# Patient Record
Sex: Female | Born: 1945
Health system: Southern US, Community
[De-identification: ages and names within clinical notes are randomized; demographics above are authoritative.]

## PROBLEM LIST (undated history)

## (undated) DIAGNOSIS — I10 Essential (primary) hypertension: Secondary | ICD-10-CM

## (undated) DIAGNOSIS — G2581 Restless legs syndrome: Secondary | ICD-10-CM

## (undated) DIAGNOSIS — M1990 Unspecified osteoarthritis, unspecified site: Secondary | ICD-10-CM

## (undated) DIAGNOSIS — R32 Unspecified urinary incontinence: Secondary | ICD-10-CM

## (undated) DIAGNOSIS — N939 Abnormal uterine and vaginal bleeding, unspecified: Secondary | ICD-10-CM

## (undated) DIAGNOSIS — E7800 Pure hypercholesterolemia, unspecified: Secondary | ICD-10-CM

## (undated) HISTORY — PX: ROTATOR CUFF REPAIR: SHX139

## (undated) HISTORY — DX: Unspecified osteoarthritis, unspecified site: M19.90

## (undated) HISTORY — DX: Abnormal uterine and vaginal bleeding, unspecified: N93.9

## (undated) HISTORY — DX: Restless legs syndrome: G25.81

## (undated) HISTORY — PX: CARDIOVERSION: SHX1299

## (undated) HISTORY — PX: RADIOFREQUENCY ABLATION: SHX2290

## (undated) HISTORY — PX: OTHER SURGICAL HISTORY: SHX169

## (undated) HISTORY — PX: TUBAL LIGATION: SHX77

---

## 2013-06-03 DIAGNOSIS — I10 Essential (primary) hypertension: Secondary | ICD-10-CM | POA: Insufficient documentation

## 2013-06-03 DIAGNOSIS — I4891 Unspecified atrial fibrillation: Secondary | ICD-10-CM

## 2013-06-03 HISTORY — DX: Essential (primary) hypertension: I10

## 2013-06-03 HISTORY — DX: Unspecified atrial fibrillation: I48.91

## 2016-07-02 ENCOUNTER — Encounter (HOSPITAL_COMMUNITY): Payer: Self-pay | Admitting: Emergency Medicine

## 2016-07-02 ENCOUNTER — Ambulatory Visit (HOSPITAL_COMMUNITY)
Admission: EM | Admit: 2016-07-02 | Discharge: 2016-07-02 | Disposition: A | Discharge status: Home / Self Care | Payer: Medicare Other | Attending: Family Medicine | Admitting: Family Medicine

## 2016-07-02 DIAGNOSIS — N390 Urinary tract infection, site not specified: Secondary | ICD-10-CM

## 2016-07-02 DIAGNOSIS — R319 Hematuria, unspecified: Secondary | ICD-10-CM | POA: Diagnosis not present

## 2016-07-02 HISTORY — DX: Pure hypercholesterolemia, unspecified: E78.00

## 2016-07-02 HISTORY — DX: Unspecified urinary incontinence: R32

## 2016-07-02 HISTORY — DX: Essential (primary) hypertension: I10

## 2016-07-02 LAB — POCT URINALYSIS DIP (DEVICE)
BILIRUBIN URINE: NEGATIVE
Glucose, UA: NEGATIVE mg/dL
Ketones, ur: NEGATIVE mg/dL
NITRITE: POSITIVE — AB
PH: 5.5 (ref 5.0–8.0)
PROTEIN: 30 mg/dL — AB
Specific Gravity, Urine: 1.015 (ref 1.005–1.030)
Urobilinogen, UA: 0.2 mg/dL (ref 0.0–1.0)

## 2016-07-02 MED ORDER — LIDOCAINE HCL (PF) 1 % IJ SOLN
INTRAMUSCULAR | Status: AC
  Filled 2016-07-02: qty 2

## 2016-07-02 MED ORDER — CEFTRIAXONE SODIUM 1 G IJ SOLR
INTRAMUSCULAR | Status: AC
  Filled 2016-07-02: qty 10

## 2016-07-02 MED ORDER — CEFTRIAXONE SODIUM 1 G IJ SOLR
1.0000 g | Freq: Once | INTRAMUSCULAR | Status: AC
  Administered 2016-07-02: 1 g via INTRAMUSCULAR

## 2016-07-02 MED ORDER — CEPHALEXIN 500 MG PO CAPS: 500.0000 mg | ORAL_CAPSULE | Freq: Three times a day (TID) | ORAL | 0 refills | Status: DC

## 2016-07-02 NOTE — ED Triage Notes (Signed)
[facility] with a complaint of abdominal pain with dysuria, urinary frequency/ urgency and some nausea that all started 5 days ago.

## 2016-07-02 NOTE — ED Provider Notes (Signed)
[facility]  CSN: [id number] Arrival date & time: 07/02/16  1430  History   Chief Complaint Chief Complaint  Patient presents with  . Urinary Frequency  . Abdominal Pain    HPI Tara Collier is a 70 y.o. female with a history of UTI's and AFib on eliquis presenting for urinary frequency, urgency and lower abdominal pain.   She reports a week of worsening urinary incontinence related to urgency consistent with past UTI's. Her urine color is no darker than usual, but malodorous. No medications tried, no fevers. Has bilateral lower back pain. She has poor appetite, so eating less but drinking normally without nausea or emesis.   HPI  Past Medical History:  Diagnosis Date  . Hypercholesterolemia   . Hypertension   . Loss of bladder control     There are no active problems to display for this patient.   Past Surgical History:  Procedure Laterality Date  . atrial fib    . ROTATOR CUFF REPAIR    . TUBAL LIGATION      OB History    No data available       Home Medications    Prior to Admission medications   Medication Sig Start Date End Date Taking? Authorizing Provider  Apixaban (ELIQUIS PO) Take by mouth.   Yes Historical Provider, MD  carvedilol (COREG) 12.5 MG tablet Take 12.5 mg by mouth 2 (two) times daily with a meal.   Yes Historical Provider, MD  losartan (COZAAR) 25 MG tablet Take 25 mg by mouth daily.   Yes Historical Provider, MD  meclizine (ANTIVERT) 25 MG tablet Take 25 mg by mouth 3 (three) times daily as needed for dizziness.   Yes Historical Provider, MD  oxybutynin (DITROPAN-XL) 5 MG 24 hr tablet Take 5 mg by mouth at bedtime.   Yes Historical Provider, MD  pravastatin (PRAVACHOL) 10 MG tablet Take 10 mg by mouth daily.   Yes Historical Provider, MD  traMADol (ULTRAM) 50 MG tablet Take by mouth every 6 (six) hours as needed.   Yes Historical Provider, MD  cephALEXin (KEFLEX) 500 MG capsule Take 1 capsule (500 mg total) by mouth 3 (three) times  daily. 07/02/16   Tyrone Nineyan B Deyonte Cadden, MD    Family History History reviewed. No pertinent family history.  Social History Social History  Substance Use Topics  . Smoking status: Never Smoker  . Smokeless tobacco: Never Used  . Alcohol use No     Allergies   Patient has no known allergies.   Review of Systems Review of Systems As above  Physical Exam Triage Vital Signs ED Triage Vitals  Enc Vitals Group     BP 07/02/16 1455 93/62     Pulse Rate 07/02/16 1455 87     Resp 07/02/16 1455 20     Temp 07/02/16 1455 98.2 F (36.8 C)     Temp Source 07/02/16 1455 Oral     SpO2 07/02/16 1455 96 %     Weight 07/02/16 1455 225 lb (102.1 kg)     Height 07/02/16 1455 5' 4.5" (1.638 m)     Head Circumference --      Peak Flow --      Pain Score 07/02/16 1503 9     Pain Loc --      Pain Edu? --      Excl. in GC? --    No data found.   Updated Vital Signs BP 93/62 (BP Location: Left Arm)   Pulse 87  Temp 98.2 F (36.8 C) (Oral)   Resp 20   Ht 5' 4.5" (1.638 m)   Wt 225 lb (102.1 kg)   LMP  (Exact Date)   SpO2 96%   BMI 38.02 kg/m   Visual Acuity Right Eye Distance:   Left Eye Distance:   Bilateral Distance:    Right Eye Near:   Left Eye Near:    Bilateral Near:     Physical Exam  Constitutional: She is oriented to person, place, and time. She appears well-developed and well-nourished. No distress.  Eyes: EOM are normal. Pupils are equal, round, and reactive to light. No scleral icterus.  Neck: Neck supple. No JVD present.  Cardiovascular: Normal rate, regular rhythm, normal heart sounds and intact distal pulses.   No murmur heard. Rhythm is regular  Pulmonary/Chest: Effort normal and breath sounds normal. No respiratory distress.  Abdominal: Soft. Bowel sounds are normal. She exhibits no distension. There is tenderness (suprapubic).  Some tenderness to palpation on bilateral lower back but no true CVA tenderness.   Musculoskeletal: Normal range of motion. She  exhibits no edema or tenderness.  Lymphadenopathy:    She has no cervical adenopathy.  Neurological: She is alert and oriented to person, place, and time. She exhibits normal muscle tone.  Skin: Skin is warm and dry.  Vitals reviewed.  UC Treatments / Results  Labs (all labs ordered are listed, but only abnormal results are displayed) Labs Reviewed  POCT URINALYSIS DIP (DEVICE) - Abnormal; Notable for the following:       Result Value   Hgb urine dipstick MODERATE (*)    Protein, ur 30 (*)    Nitrite POSITIVE (*)    Leukocytes, UA LARGE (*)    All other components within normal limits  URINE CULTURE    EKG  EKG Interpretation None      Radiology No results found.  Procedures Procedures (including critical care time)  Medications Ordered in UC Medications  cefTRIAXone (ROCEPHIN) injection 1 g (not administered)     Initial Impression / Assessment and Plan / UC Course  I have reviewed the triage vital signs and the nursing notes.  Pertinent labs & imaging results that were available during my care of the patient were reviewed by me and considered in my medical decision making (see chart for details).  Final Clinical Impressions(s) / UC Diagnoses   Final diagnoses:  Urinary tract infection with hematuria, site unspecified   70 y.o. non-toxic-appearing female with grossly infected urine and UTI symptoms. She's in no distress, hydrated on exam. BP is soft, but no evidence of AFib or sepsis (afebrile, not tachycardic). Will Tx ceftriaxone IM x1 and keflex (no culture data from prior UTI's available, but no h/o instrumentation or diabetes or other complicating factors) pending urine culture results. Strict return precautions were reviewed, including inability to take po for hydration or tolerance of antibiotic or fever/worsening of symptoms.   New Prescriptions New Prescriptions   CEPHALEXIN (KEFLEX) 500 MG CAPSULE    Take 1 capsule (500 mg total) by mouth 3 (three)  times daily.     Tyrone Nineyan B Normal Recinos, MD 07/02/16 [phone]

## 2018-08-13 LAB — LIPID PANEL
Cholesterol: 170 (ref 0–200)
HDL: 50 (ref 35–70)
LDL Cholesterol: 105
Triglycerides: 77 (ref 40–160)

## 2018-08-13 LAB — VITAMIN D 25 HYDROXY (VIT D DEFICIENCY, FRACTURES): Vit D, 25-Hydroxy: 45

## 2020-08-08 ENCOUNTER — Ambulatory Visit (INDEPENDENT_AMBULATORY_CARE_PROVIDER_SITE_OTHER): Payer: Self-pay | Admitting: Physician Assistant

## 2020-08-08 ENCOUNTER — Encounter: Payer: Self-pay | Admitting: Physician Assistant

## 2020-08-08 ENCOUNTER — Other Ambulatory Visit: Payer: Self-pay

## 2020-08-08 VITALS — BP 140/80 | HR 70 | Temp 97.9°F | Ht 63.0 in | Wt 244.2 lb

## 2020-08-08 DIAGNOSIS — I4891 Unspecified atrial fibrillation: Secondary | ICD-10-CM

## 2020-08-08 DIAGNOSIS — M1990 Unspecified osteoarthritis, unspecified site: Secondary | ICD-10-CM | POA: Insufficient documentation

## 2020-08-08 DIAGNOSIS — I10 Essential (primary) hypertension: Secondary | ICD-10-CM

## 2020-08-08 DIAGNOSIS — R32 Unspecified urinary incontinence: Secondary | ICD-10-CM

## 2020-08-08 DIAGNOSIS — G2581 Restless legs syndrome: Secondary | ICD-10-CM | POA: Insufficient documentation

## 2020-08-08 DIAGNOSIS — E7800 Pure hypercholesterolemia, unspecified: Secondary | ICD-10-CM | POA: Insufficient documentation

## 2020-08-08 DIAGNOSIS — E785 Hyperlipidemia, unspecified: Secondary | ICD-10-CM

## 2020-08-08 HISTORY — DX: Unspecified urinary incontinence: R32

## 2020-08-08 MED ORDER — LOSARTAN POTASSIUM 100 MG PO TABS: 100.0000 mg | ORAL_TABLET | Freq: Every day | ORAL | 1 refills | Status: DC

## 2020-08-08 MED ORDER — MIRABEGRON ER 50 MG PO TB24: 50.0000 mg | ORAL_TABLET | Freq: Every day | ORAL | 1 refills | Status: DC

## 2020-08-08 MED ORDER — ATORVASTATIN CALCIUM 40 MG PO TABS: 40.0000 mg | ORAL_TABLET | Freq: Every day | ORAL | 1 refills | Status: DC

## 2020-08-08 MED ORDER — GABAPENTIN 100 MG PO CAPS: 100.0000 mg | ORAL_CAPSULE | Freq: Every day | ORAL | 1 refills | Status: DC

## 2020-08-08 NOTE — Progress Notes (Signed)
Tara Collier is a 74 y.o. female is here to establish care.  I acted as a Neurosurgeon for [employer], PA-C Corky Mull, LPN   History of Present Illness:   Chief Complaint  Patient presents with  . Establish Care  . Leg Swelling  . Urinary Incontinence    HPI   Pt is here to establish care today. She is present with her daughter, Tara Collier. Patient is new to the area to be close to her daughter.   HTN Currently taking Carvedilol 12.5 mg BID, Amlodopine 5 mg (takes approx twice a week - cannot tell me why she does this). She is also prescribed losartan 100 mg but has been out of this for quite awhile. At home blood pressure readings are: not checked. Patient denies chest pain, blurred vision, dizziness, unusual headaches. Patient is non compliant with medication. Denies excessive caffeine intake, stimulant usage, excessive alcohol intake, or increase in salt consumption.  BP Readings from Last 3 Encounters:  08/08/20 140/80  07/02/16 93/62   Hyperlipidemia Current prescribed lipitor 40 mg daily and not taking due to running out of medication. She reports that she was tolerating this well.  Atrial fibrillation S/p cather ablation in 2013. Currently on eliquis 5 mg BID. She takes this regularly. She states that she has not seen a cardiologist since 2014. At that time (chart is in Epic -- [facility]) she was seeing Dr. Jane Canary and was well maintained on propafenone. She cannot tell me why she is no longer on this medication. She endorses DOE and generalized swelling in her lower legs. Last stress test in 2013. Echo in 2013.  Neuropathy/RLS Currently taking 300 mg gabapentin tablet a few days a week. She states that this medication is sedating and does not like to take it however it is effective for her neuropathy and RLS.  Urinary incontinence Pt is c/o increase in incontinence, wears pull ups. Pt would like a referral to a urologist. She has taken oxybutynin in the  past but no success. She had better results with myrbetriq but she ran out of this medication. She would like better control of her incontinence because it is quite out of control and dictates her quality of life. She is tearful talking about this.    Health Maintenance Due  Topic Date Due  . Hepatitis C Screening  Never done  . TETANUS/TDAP  Never done  . MAMMOGRAM  Never done  . COLONOSCOPY  Never done  . DEXA SCAN  Never done  . PNA vac Low Risk Adult (1 of 2 - PCV13) Never done    Past Medical History:  Diagnosis Date  . Arthritis   . Atrial fibrillation (HCC) 06/03/2013   Last Assessment & Plan:  Formatting of this note might be different from the original. Continue propafenone for rhythm control .Patient has clear symptomatic benefit with sinus rhythm maintenance. CHADS-Vasc score over 2, continue anticoagulation with eliquis.  Marland Kitchen HTN (hypertension) 06/03/2013   Last Assessment & Plan:  Formatting of this note might be different from the original. Hypertension is well controlled.  Continue current medical regimen and low sodium diet.  Reevaluate at next office visit  . Hypercholesterolemia   . Restless leg syndrome   . Urinary incontinence 08/08/2020     Social History   Tobacco Use  . Smoking status: Never Smoker  . Smokeless tobacco: Never Used  Substance Use Topics  . Alcohol use: No  . Drug use: No    Past  Surgical History:  Procedure Laterality Date  . CARDIOVERSION    . RADIOFREQUENCY ABLATION    . ROTATOR CUFF REPAIR    . TUBAL LIGATION      No family history on file.  PMHx, SurgHx, SocialHx, FamHx, Medications, and Allergies were reviewed in the Visit Navigator and updated as appropriate.   Patient Active Problem List   Diagnosis Date Noted  . Urinary incontinence 08/08/2020  . Arthritis   . Hypercholesterolemia   . Restless leg syndrome   . Atrial fibrillation (HCC) 06/03/2013  . HTN (hypertension) 06/03/2013    Social History   Tobacco Use   . Smoking status: Never Smoker  . Smokeless tobacco: Never Used  Substance Use Topics  . Alcohol use: No  . Drug use: No    Current Medications and Allergies:    Current Outpatient Medications:  .  Apixaban (ELIQUIS PO), Take 5 mg by mouth 2 (two) times daily., Disp: , Rfl:  .  carvedilol (COREG) 12.5 MG tablet, Take 12.5 mg by mouth 2 (two) times daily with a meal., Disp: , Rfl:  .  diclofenac Sodium (VOLTAREN) 1 % GEL, Apply 2 g topically 4 (four) times daily., Disp: , Rfl:  .  ergocalciferol (VITAMIN D2) 1.25 MG (50000 UT) capsule, Take by mouth., Disp: , Rfl:  .  meclizine (ANTIVERT) 25 MG tablet, Take 25 mg by mouth 3 (three) times daily as needed for dizziness., Disp: , Rfl:  .  polyethylene glycol powder (GLYCOLAX/MIRALAX) 17 GM/SCOOP powder, Take 1 g by mouth daily as needed., Disp: , Rfl:  .  atorvastatin (LIPITOR) 40 MG tablet, Take 1 tablet (40 mg total) by mouth daily., Disp: 90 tablet, Rfl: 1 .  gabapentin (NEURONTIN) 100 MG capsule, Take 1 capsule (100 mg total) by mouth at bedtime., Disp: 90 capsule, Rfl: 1 .  losartan (COZAAR) 100 MG tablet, Take 1 tablet (100 mg total) by mouth daily., Disp: 90 tablet, Rfl: 1 .  mirabegron ER (MYRBETRIQ) 50 MG TB24 tablet, Take 1 tablet (50 mg total) by mouth daily., Disp: 90 tablet, Rfl: 1   Allergies  Allergen Reactions  . Tape Rash  . Bee Venom Rash    Review of Systems   ROS  Negative unless otherwise specified per HPI.  Vitals:   Vitals:   08/08/20 0834  BP: 140/80  Pulse: 70  Temp: 97.9 F (36.6 C)  TempSrc: Temporal  SpO2: 95%  Weight: 244 lb 4 oz (110.8 kg)  Height: 5\' 3"  (1.6 m)     Body mass index is 43.27 kg/m.   Physical Exam:    Physical Exam Vitals and nursing note reviewed.  Constitutional:      General: She is not in acute distress.    Appearance: She is well-developed. She is not ill-appearing, toxic-appearing or sickly-appearing.  Cardiovascular:     Rate and Rhythm: Normal rate. Rhythm  irregularly irregular.     Pulses: Normal pulses.     Heart sounds: Normal heart sounds, S1 normal and S2 normal.  Pulmonary:     Effort: Pulmonary effort is normal.     Breath sounds: Normal breath sounds.  Musculoskeletal:     Right lower leg: 1+ Edema present.     Left lower leg: 1+ Edema present.  Skin:    General: Skin is warm, dry and intact.  Neurological:     Mental Status: She is alert.     GCS: GCS eye subscore is 4. GCS verbal subscore is 5. GCS motor subscore  is 6.  Psychiatric:        Mood and Affect: Mood and affect normal.        Speech: Speech normal.        Behavior: Behavior normal. Behavior is cooperative.      Assessment and Plan:    Tara Collier was seen today for establish care, leg swelling and urinary incontinence.  Diagnoses and all orders for this visit:  Primary hypertension Overall controlled in office but she is not taking her medication consistently. Plan: -Update blood work today -Hold norvasc 5 mg due to LE swelling -Continue carvedilol 12.5 mg BID -Re-start losartan 100 mg daily -BP log given -Reviewed need to put medications in pill box with daughter present to promote better compliance -Cardiology referral due to a fib -     CBC with Differential/Platelet; Future -     Comprehensive metabolic panel; Future -     Ambulatory referral to Cardiology -     Comprehensive metabolic panel -     CBC with Differential/Platelet  Hyperlipidemia, unspecified hyperlipidemia type Update lipid panel today. Refill/adjust lipitor 40 mg as indicated.  -     Lipid panel; Future -     Ambulatory referral to Cardiology -     Lipid panel  Atrial fibrillation, unspecified type (HCC) In atrial fibrillation today on my exam. Unclear why she is no longer on her anti-arrhythmic. Continue Eliquis 5 mg BID. Cardiology referral. -     CBC with Differential/Platelet; Future -     Comprehensive metabolic panel; Future -     Ambulatory referral to Cardiology -      Comprehensive metabolic panel -     CBC with Differential/Platelet  Urinary incontinence, unspecified type Uncontrolled. Refill myrbetriq. Referral to [facility]. -     Ambulatory referral to Urology  Restless leg syndrome Uncontrolled. Does not like sedating side effects of gabapentin 300 mg daily.  Decrease to gabapentin 100 mg daily. Follow-up in 1 month.  Due to patient complexity, recommended return office visit in 1 month to continue assess ongoing chronic medical issues.  Other orders -     atorvastatin (LIPITOR) 40 MG tablet; Take 1 tablet (40 mg total) by mouth daily. -     losartan (COZAAR) 100 MG tablet; Take 1 tablet (100 mg total) by mouth daily. -     mirabegron ER (MYRBETRIQ) 50 MG TB24 tablet; Take 1 tablet (50 mg total) by mouth daily. -     gabapentin (NEURONTIN) 100 MG capsule; Take 1 capsule (100 mg total) by mouth at bedtime.  CMA or LPN served as scribe during this visit. History, Physical, and Plan performed by medical provider. The above documentation has been reviewed and is accurate and complete.  Time spent with patient today was 50 minutes which consisted of chart review, discussing diagnosis, work up, treatment answering questions and documentation.  Jarold Motto, PA-C [place], [place] 08/08/2020  Follow-up: No follow-ups on file.

## 2020-08-08 NOTE — Patient Instructions (Addendum)
It was great to see you!  HTN --Check blood pressure 2-3 times a week for me/cardiology --Let's have you take your blood pressure medication regularly: --->Carvedilol 12.5 mg twice daily --->Losartan 100 mg daily --->We are going to HOLD your 5 mg norvasc for now as this can contribute to lower leg swelling and I think your blood pressure can handle this. Cardiology referral -- you will be contacted about a referral  Restless leg syndrome Decrease gabapentin to 100 mg daily. Take this every night. If you feel like this is making you sleepy, call me.  For urinary incontinence Restart myrbetriq 50 mg Referral to urology -- you will be contacted about a referral  Please fill your pill box weekly with your daughter's oversight.  Follow-up with me in 1 MONTH.  Take care,  Jarold Motto PA-C

## 2020-08-09 LAB — COMPREHENSIVE METABOLIC PANEL
AG Ratio: 1.9 (calc) (ref 1.0–2.5)
ALT: 16 U/L (ref 6–29)
AST: 18 U/L (ref 10–35)
Albumin: 3.9 g/dL (ref 3.6–5.1)
Alkaline phosphatase (APISO): 84 U/L (ref 37–153)
BUN: 18 mg/dL (ref 7–25)
CO2: 30 mmol/L (ref 20–32)
Calcium: 9 mg/dL (ref 8.6–10.4)
Chloride: 106 mmol/L (ref 98–110)
Creat: 0.79 mg/dL (ref 0.60–0.93)
Globulin: 2.1 g/dL (calc) (ref 1.9–3.7)
Glucose, Bld: 99 mg/dL (ref 65–99)
Potassium: 4.7 mmol/L (ref 3.5–5.3)
Sodium: 141 mmol/L (ref 135–146)
Total Bilirubin: 1 mg/dL (ref 0.2–1.2)
Total Protein: 6 g/dL — ABNORMAL LOW (ref 6.1–8.1)

## 2020-08-09 LAB — CBC WITH DIFFERENTIAL/PLATELET
Absolute Monocytes: 643 cells/uL (ref 200–950)
Basophils Absolute: 38 cells/uL (ref 0–200)
Basophils Relative: 0.7 %
Eosinophils Absolute: 232 cells/uL (ref 15–500)
Eosinophils Relative: 4.3 %
HCT: 34.3 % — ABNORMAL LOW (ref 35.0–45.0)
Hemoglobin: 11.2 g/dL — ABNORMAL LOW (ref 11.7–15.5)
Lymphs Abs: 1139 cells/uL (ref 850–3900)
MCH: 29.6 pg (ref 27.0–33.0)
MCHC: 32.7 g/dL (ref 32.0–36.0)
MCV: 90.7 fL (ref 80.0–100.0)
MPV: 11.9 fL (ref 7.5–12.5)
Monocytes Relative: 11.9 %
Neutro Abs: 3348 cells/uL (ref 1500–7800)
Neutrophils Relative %: 62 %
Platelets: 202 10*3/uL (ref 140–400)
RBC: 3.78 10*6/uL — ABNORMAL LOW (ref 3.80–5.10)
RDW: 12.9 % (ref 11.0–15.0)
Total Lymphocyte: 21.1 %
WBC: 5.4 10*3/uL (ref 3.8–10.8)

## 2020-08-09 LAB — LIPID PANEL
Cholesterol: 236 mg/dL — ABNORMAL HIGH (ref <200)
HDL: 45 mg/dL — ABNORMAL LOW (ref >=50)
LDL Cholesterol (Calc): 166 mg/dL (calc) — ABNORMAL HIGH
Non-HDL Cholesterol (Calc): 191 mg/dL (calc) — ABNORMAL HIGH (ref <130)
Total CHOL/HDL Ratio: 5.2 (calc) — ABNORMAL HIGH (ref <5.0)
Triglycerides: 120 mg/dL (ref <150)

## 2020-08-10 ENCOUNTER — Other Ambulatory Visit: Payer: Self-pay

## 2020-08-10 DIAGNOSIS — Z1211 Encounter for screening for malignant neoplasm of colon: Secondary | ICD-10-CM

## 2020-09-04 ENCOUNTER — Ambulatory Visit: Payer: Self-pay | Admitting: Internal Medicine

## 2020-09-05 ENCOUNTER — Encounter: Payer: Self-pay | Admitting: Physician Assistant

## 2020-09-10 ENCOUNTER — Ambulatory Visit: Payer: Self-pay | Admitting: Physician Assistant

## 2020-09-12 DIAGNOSIS — H524 Presbyopia: Secondary | ICD-10-CM | POA: Diagnosis not present

## 2020-09-12 NOTE — Progress Notes (Signed)
Cardiology Office Note:    Date:  09/12/2020   ID:  [name], DOB 08/09/1946, MRN [id number]  PCP:  Jarold Motto, PA  Cardiologist:  No primary care provider on file.  Electrophysiologist:  None   Referring MD: Jarold Motto, PA   Chief Complaint/Reason for Referral: HTN, HLD, afib  History of Present Illness:    Tara Collier is a 75 y.o. female with a history of HTN, HLD, afib. Presents for further evaluation of afib. Presents with daughter who provides some of the history.   Main concern is DOE. Can't walk long distance. Short of breath and wheezing with exertion.   Afib history includes ablation in 2013 - has taken propafenone- unclear when stopped. Apixban 5 mg BID has been continued. 2014 last cardiology visit. Last taken propafenone in 2014. Eliquis 5 mg BID now. Some nose bleeds but no major bleeding events.   Has a history of HTN. Had some LE swelling, this improved after stopping amlodipine.   "Larey Seat out" from dehydration before and UTIs.   Difficult to obtain history due to wandering thoughts. The patient denies chest pain, chest pressure, PND, orthopnea. Has some mild leg swelling. Denies cough, fever, chills. Denies nausea, vomiting. Denies syncope or presyncope. Denies dizziness or lightheadedness.    Past Medical History:  Diagnosis Date  . Arthritis   . Atrial fibrillation (HCC) 06/03/2013   Last Assessment & Plan:  Formatting of this note might be different from the original. Continue propafenone for rhythm control .Patient has clear symptomatic benefit with sinus rhythm maintenance. CHADS-Vasc score over 2, continue anticoagulation with eliquis.  Marland Kitchen HTN (hypertension) 06/03/2013   Last Assessment & Plan:  Formatting of this note might be different from the original. Hypertension is well controlled.  Continue current medical regimen and low sodium diet.  Reevaluate at next office visit  . Hypercholesterolemia   . Restless leg syndrome   . Urinary  incontinence 08/08/2020    Past Surgical History:  Procedure Laterality Date  . CARDIOVERSION    . RADIOFREQUENCY ABLATION    . ROTATOR CUFF REPAIR    . TUBAL LIGATION      Current Medications: Current Meds  Medication Sig  . atorvastatin (LIPITOR) 40 MG tablet Take 1 tablet (40 mg total) by mouth daily.  . diclofenac Sodium (VOLTAREN) 1 % GEL Apply 2 g topically 4 (four) times daily.  Marland Kitchen gabapentin (NEURONTIN) 100 MG capsule Take 1 capsule (100 mg total) by mouth at bedtime.  . mirabegron ER (MYRBETRIQ) 50 MG TB24 tablet Take 1 tablet (50 mg total) by mouth daily.  . polyethylene glycol powder (GLYCOLAX/MIRALAX) 17 GM/SCOOP powder Take 1 g by mouth daily as needed.  . [DISCONTINUED] Apixaban (ELIQUIS PO) Take 5 mg by mouth 2 (two) times daily.  . [DISCONTINUED] carvedilol (COREG) 12.5 MG tablet Take 12.5 mg by mouth 2 (two) times daily with a meal.  . [DISCONTINUED] ergocalciferol (VITAMIN D2) 1.25 MG (50000 UT) capsule Take by mouth.  . [DISCONTINUED] losartan (COZAAR) 100 MG tablet Take 1 tablet (100 mg total) by mouth daily.  . [DISCONTINUED] meclizine (ANTIVERT) 25 MG tablet Take 25 mg by mouth 3 (three) times daily as needed for dizziness.     Allergies:   Tape and Bee venom   Social History   Tobacco Use  . Smoking status: Never Smoker  . Smokeless tobacco: Never Used  Substance Use Topics  . Alcohol use: No  . Drug use: No     Family History: The patient's family  history is not on file.  ROS:   Please see the history of present illness.    All other systems reviewed and are negative.  EKGs/Labs/Other Studies Reviewed:    The following studies were reviewed today:  EKG:  Afib, septal infarct pattern.   Recent Labs: 08/08/2020: ALT 16; BUN 18; Creat 0.79; Hemoglobin 11.2; Platelets 202; Potassium 4.7; Sodium 141  Recent Lipid Panel    Component Value Date/Time   CHOL 236 (H) 08/08/2020 0935   TRIG 120 08/08/2020 0935   HDL 45 (L) 08/08/2020 0935    CHOLHDL 5.2 (H) 08/08/2020 0935   LDLCALC 166 (H) 08/08/2020 0935    Physical Exam:    VS:  BP 136/82   Pulse 71   Ht 5\' 4"  (1.626 m)   Wt 248 lb (112.5 kg)   SpO2 95%   BMI 42.57 kg/m     Wt Readings from Last 5 Encounters:  08/08/20 244 lb 4 oz (110.8 kg)  07/02/16 225 lb (102.1 kg)    Constitutional: No acute distress Eyes: sclera non-icteric, normal conjunctiva and lids ENMT: normal dentition, moist mucous membranes Cardiovascular: irregular rhythm, normal rate, no murmurs. S1 and S2 normal. Radial pulses normal bilaterally. No jugular venous distention.  Respiratory: clear to auscultation bilaterally GI : normal bowel sounds, soft and nontender. No distention.   MSK: extremities warm, well perfused. Mild bilateral edema.  NEURO: grossly nonfocal exam, moves all extremities. PSYCH: alert and oriented x 3, normal mood and affect.   ASSESSMENT:    1. Atrial fibrillation, unspecified type (HCC)   2. Dyspnea on exertion   3. Lower extremity edema   4. Hypercholesterolemia   5. Primary hypertension    PLAN:    Atrial fibrillation, unspecified type (HCC)  - Plan: EKG 12-Lead, ECHOCARDIOGRAM COMPLETE, CARDIAC EVENT MONITOR - will obtain echo and monitor to risk stratify. Afib appears persistent.  - will refer to afib clinic given history of ablation and propafenone use, to see if perhaps continuing AAD may help.   Dyspnea on exertion - Plan: EKG 12-Lead, ECHOCARDIOGRAM COMPLETE, CARDIAC EVENT MONITOR - suspect this may be related to afib. If afib burden low on monitor, would consider ischemic testing. Echo and monitor to evaluate further. Challenging to obtain clear history.   Lower extremity edema - Plan: EKG 12-Lead, ECHOCARDIOGRAM COMPLETE, CARDIAC EVENT MONITOR  Hypercholesterolemia - continue atorvastatin 40 mg dialy  Primary hypertension -continue carvedilol and losartan.    Total time of encounter: 45 minutes total time of encounter, including 37 minutes  spent in face-to-face patient care on the date of this encounter. This time includes coordination of care and counseling regarding above mentioned problem list. Remainder of non-face-to-face time involved reviewing chart documents/testing relevant to the patient encounter and documentation in the medical record. I have independently reviewed documentation from referring provider.   13/08/17, MD Snyder  [facility]    Medication Adjustments/Labs and Tests Ordered: Current medicines are reviewed at length with the patient today.  Concerns regarding medicines are outlined above.   Orders Placed This Encounter  Procedures  . CARDIAC EVENT MONITOR  . EKG 12-Lead  . ECHOCARDIOGRAM COMPLETE    No orders of the defined types were placed in this encounter.   Patient Instructions  Medication Instructions:  No Changes In Medications at this time.  *If you need a refill on your cardiac medications before your next appointment, please call your pharmacy*  Testing/Procedures: Your physician has requested that you have an echocardiogram. Echocardiography  is a painless test that uses sound waves to create images of your heart. It provides your doctor with information about the size and shape of your heart and how well your heart's chambers and valves are working. You may receive an ultrasound enhancing agent through an IV if needed to better visualize your heart during the echo.This procedure takes approximately one hour. There are no restrictions for this procedure. This will take place at the [address], [address].   [employer] Cardiac Event Monitor Instructions Your physician has requested you wear your cardiac event monitor for 14 days, (1-30). [employer] may call or text to confirm a shipping address. The monitor will be sent to a land address via UPS. [employer] will not ship a monitor to a PO BOX. It typically takes 3-5 days to receive your monitor after it has been  enrolled. [employer] will assist with USPS tracking if your package is delayed. The telephone number for [employer] is [phone]. Once you have received your monitor, please review the enclosed instructions. Instruction tutorials can also be viewed under help and settings on the enclosed cell phone. Your monitor has already been registered assigning a specific monitor serial # to you.  Applying the monitor Remove cell phone from case and turn it on. The cell phone works as IT consultantyour transmitter and needs to be within [employer] feet of you at all times. The cell phone will need to be charged on a daily basis. We recommend you plug the cell phone into the enclosed charger at your bedside table every night.  Monitor batteries: You will receive two monitor batteries labelled #1 and #2. These are your recorders. Plug battery #2 onto the second connection on the enclosed charger. Keep one battery on the charger at all times. This will keep the monitor battery deactivated. It will also keep it fully charged for when you need to switch your monitor batteries. A small light will be blinking on the battery emblem when it is charging. The light on the battery emblem will remain on when the battery is fully charged.  Open package of a Monitor strip. Insert battery #1 into black hood on strip and gently squeeze monitor battery onto connection as indicated in instruction booklet. Set aside while preparing skin.  Choose location for your strip, vertical or horizontal, as indicated in the instruction booklet. Shave to remove all hair from location. There cannot be any lotions, oils, powders, or colognes on skin where monitor is to be applied. Wipe skin clean with enclosed Saline wipe. Dry skin completely.  Peel paper labeled #1 off the back of the Monitor strip exposing the adhesive. Place the monitor on the chest in the vertical or horizontal position shown in the instruction booklet. One arrow on the monitor  strip must be pointing upward. Carefully remove paper labeled #2, attaching remainder of strip to your skin. Try not to create any folds or wrinkles in the strip as you apply it.  Firmly press and release the circle in the center of the monitor battery. You will hear a small beep. This is turning the monitor battery on. The heart emblem on the monitor battery will light up every 5 seconds if the monitor battery in turned on and connected to the patient securely. Do not push and hold the circle down as this turns the monitor battery off. The cell phone will locate the monitor battery. A screen will appear on the cell phone checking the connection of your monitor strip. This  may read poor connection initially but change to good connection within the next minute. Once your monitor accepts the connection you will hear a series of 3 beeps followed by a climbing crescendo of beeps. A screen will appear on the cell phone showing the two monitor strip placement options. Touch the picture that demonstrates where you applied the monitor strip.  Your monitor strip and battery are waterproof. You are able to shower, bathe, or swim with the monitor on. They just ask you do not submerge deeper than 3 feet underwater. We recommend removing the monitor if you are swimming in a lake, river, or ocean.  Your monitor battery will need to be switched to a fully charged monitor battery approximately once a week. The cell phone will alert you of an action which needs to be made.  On the cell phone, tap for details to reveal connection status, monitor battery status, and cell phone battery status. The green dots indicates your monitor is in good status. A red dot indicates there is something that needs your attention.  To record a symptom, click the circle on the monitor battery. In 30-60 seconds a list of symptoms will appear on the cell phone. Select your symptom and tap save. Your monitor will record a sustained  or significant arrhythmia regardless of you clicking the button. Some patients do not feel the heart rhythm irregularities. [employer] will notify us of any serious or critical events.  Refer to instruction booklet for instructions on switching batteries, changing strips, the Do not disturb or Pause features, or any additional questions.  Call [employer] at [phone], to confirm your monitor is transmitting and record your baseline. They will answer any questions you may have regarding the monitor instructions at that time.  Returning the monitor to [employer] Place all equipment back into blue box. Peel off strip of paper to expose adhesive and close box securely. There is a prepaid UPS shipping label on this box. Drop in a UPS drop box, or at a UPS facility like [employer]. You may also contact [employer] to arrange UPS to pick up monitor package at your home.  Follow-Up: At [facility], you and your health needs are our priority.  As part of our continuing mission to provide you with exceptional heart care, we have created designated Provider Care Teams.  These Care Teams include your primary Cardiologist (physician) and Advanced Practice Providers (APPs -  Physician Assistants and Nurse Practitioners) who all work together to provide you with the care you need, when you need it.  We recommend signing up for the patient portal called "MyChart".  Sign up information is provided on this After Visit Summary.  MyChart is used to connect with patients for Virtual Visits (Telemedicine).  Patients are able to view lab/test results, encounter notes, upcoming appointments, etc.  Non-urgent messages can be sent to your provider as well.   To learn more about what you can do with MyChart, go to [email or web address].    Your next appointment:   AFTER A FIB CLINIC   The format for your next appointment:   In Person  Provider:   Weston Brass, MD  Other Instructions PATIENT NEEDS  APPOINTMENT AT [facility] CLINIC

## 2020-09-13 ENCOUNTER — Encounter: Payer: Self-pay | Admitting: *Deleted

## 2020-09-13 ENCOUNTER — Other Ambulatory Visit: Payer: Self-pay

## 2020-09-13 ENCOUNTER — Ambulatory Visit (INDEPENDENT_AMBULATORY_CARE_PROVIDER_SITE_OTHER): Payer: Medicare HMO | Admitting: Internal Medicine

## 2020-09-13 ENCOUNTER — Encounter: Payer: Self-pay | Admitting: Internal Medicine

## 2020-09-13 VITALS — BP 136/82 | HR 71 | Ht 64.0 in | Wt 248.0 lb

## 2020-09-13 DIAGNOSIS — R351 Nocturia: Secondary | ICD-10-CM | POA: Diagnosis not present

## 2020-09-13 DIAGNOSIS — R0600 Dyspnea, unspecified: Secondary | ICD-10-CM | POA: Diagnosis not present

## 2020-09-13 DIAGNOSIS — R350 Frequency of micturition: Secondary | ICD-10-CM | POA: Diagnosis not present

## 2020-09-13 DIAGNOSIS — D6869 Other thrombophilia: Secondary | ICD-10-CM

## 2020-09-13 DIAGNOSIS — I4891 Unspecified atrial fibrillation: Secondary | ICD-10-CM

## 2020-09-13 DIAGNOSIS — I10 Essential (primary) hypertension: Secondary | ICD-10-CM | POA: Diagnosis not present

## 2020-09-13 DIAGNOSIS — R600 Localized edema: Secondary | ICD-10-CM

## 2020-09-13 DIAGNOSIS — R0609 Other forms of dyspnea: Secondary | ICD-10-CM

## 2020-09-13 DIAGNOSIS — N3944 Nocturnal enuresis: Secondary | ICD-10-CM | POA: Diagnosis not present

## 2020-09-13 DIAGNOSIS — E7800 Pure hypercholesterolemia, unspecified: Secondary | ICD-10-CM | POA: Diagnosis not present

## 2020-09-13 DIAGNOSIS — N3946 Mixed incontinence: Secondary | ICD-10-CM | POA: Diagnosis not present

## 2020-09-13 NOTE — Progress Notes (Signed)
Patient ID: Tara Collier, female   DOB: [birth year], 75 y.o.   MRN: [id number] Patient enrolled for [employer] to ship a 14 day cardiac event monitor to her home.

## 2020-09-13 NOTE — Patient Instructions (Signed)
Medication Instructions:  No Changes In Medications at this time.  *If you need a refill on your cardiac medications before your next appointment, please call your pharmacy*  Testing/Procedures: Your physician has requested that you have an echocardiogram. Echocardiography is a painless test that uses sound waves to create images of your heart. It provides your doctor with information about the size and shape of your heart and how well your heart's chambers and valves are working. You may receive an ultrasound enhancing agent through an IV if needed to better visualize your heart during the echo.This procedure takes approximately one hour. There are no restrictions for this procedure. This will take place at the [address]   [employer] Cardiac Event Monitor Instructions Your physician has requested you wear your cardiac event monitor for 14 days, (1-30). [employer] may call or text to confirm a shipping address. The monitor will be sent to a land address via UPS. [employer] will not ship a monitor to a PO BOX. It typically takes 3-5 days to receive your monitor after it has been enrolled. [employer] will assist with USPS tracking if your package is delayed. The telephone number for [employer] is [phone]. Once you have received your monitor, please review the enclosed instructions. Instruction tutorials can also be viewed under help and settings on the enclosed cell phone. Your monitor has already been registered assigning a specific monitor serial # to you.  Applying the monitor Remove cell phone from case and turn it on. The cell phone works as IT consultant and needs to be within [employer] of you at all times. The cell phone will need to be charged on a daily basis. We recommend you plug the cell phone into the enclosed charger at your bedside table every night.  Monitor batteries: You will receive two monitor batteries labelled #1 and #2. These are your recorders.  Plug battery #2 onto the second connection on the enclosed charger. Keep one battery on the charger at all times. This will keep the monitor battery deactivated. It will also keep it fully charged for when you need to switch your monitor batteries. A small light will be blinking on the battery emblem when it is charging. The light on the battery emblem will remain on when the battery is fully charged.  Open package of a Monitor strip. Insert battery #1 into black hood on strip and gently squeeze monitor battery onto connection as indicated in instruction booklet. Set aside while preparing skin.  Choose location for your strip, vertical or horizontal, as indicated in the instruction booklet. Shave to remove all hair from location. There cannot be any lotions, oils, powders, or colognes on skin where monitor is to be applied. Wipe skin clean with enclosed Saline wipe. Dry skin completely.  Peel paper labeled #1 off the back of the Monitor strip exposing the adhesive. Place the monitor on the chest in the vertical or horizontal position shown in the instruction booklet. One arrow on the monitor strip must be pointing upward. Carefully remove paper labeled #2, attaching remainder of strip to your skin. Try not to create any folds or wrinkles in the strip as you apply it.  Firmly press and release the circle in the center of the monitor battery. You will hear a small beep. This is turning the monitor battery on. The heart emblem on the monitor battery will light up every 5 seconds if the monitor battery in turned on and connected to the patient securely.  Do not push and hold the circle down as this turns the monitor battery off. The cell phone will locate the monitor battery. A screen will appear on the cell phone checking the connection of your monitor strip. This may read poor connection initially but change to good connection within the next minute. Once your monitor accepts the connection you  will hear a series of 3 beeps followed by a climbing crescendo of beeps. A screen will appear on the cell phone showing the two monitor strip placement options. Touch the picture that demonstrates where you applied the monitor strip.  Your monitor strip and battery are waterproof. You are able to shower, bathe, or swim with the monitor on. They just ask you do not submerge deeper than 3 feet underwater. We recommend removing the monitor if you are swimming in a lake, river, or ocean.  Your monitor battery will need to be switched to a fully charged monitor battery approximately once a week. The cell phone will alert you of an action which needs to be made.  On the cell phone, tap for details to reveal connection status, monitor battery status, and cell phone battery status. The green dots indicates your monitor is in good status. A red dot indicates there is something that needs your attention.  To record a symptom, click the circle on the monitor battery. In 30-60 seconds a list of symptoms will appear on the cell phone. Select your symptom and tap save. Your monitor will record a sustained or significant arrhythmia regardless of you clicking the button. Some patients do not feel the heart rhythm irregularities. [employer] will notify us of any serious or critical events.  Refer to instruction booklet for instructions on switching batteries, changing strips, the Do not disturb or Pause features, or any additional questions.  Call [employer] at [phone], to confirm your monitor is transmitting and record your baseline. They will answer any questions you may have regarding the monitor instructions at that time.  Returning the monitor to [employer] Place all equipment back into blue box. Peel off strip of paper to expose adhesive and close box securely. There is a prepaid UPS shipping label on this box. Drop in a UPS drop box, or at a UPS facility like [employer]. You may also contact  [employer] to arrange UPS to pick up monitor package at your home.  Follow-Up: At [facility], you and your health needs are our priority.  As part of our continuing mission to provide you with exceptional heart care, we have created designated Provider Care Teams.  These Care Teams include your primary Cardiologist (physician) and Advanced Practice Providers (APPs -  Physician Assistants and Nurse Practitioners) who all work together to provide you with the care you need, when you need it.  We recommend signing up for the patient portal called "MyChart".  Sign up information is provided on this After Visit Summary.  MyChart is used to connect with patients for Virtual Visits (Telemedicine).  Patients are able to view lab/test results, encounter notes, upcoming appointments, etc.  Non-urgent messages can be sent to your provider as well.   To learn more about what you can do with MyChart, go to [email or web address].    Your next appointment:   AFTER A FIB CLINIC   The format for your next appointment:   In Person  Provider:   Weston Brass, MD  Other Instructions PATIENT NEEDS APPOINTMENT AT [facility] CLINIC

## 2020-09-14 ENCOUNTER — Ambulatory Visit: Payer: Self-pay | Admitting: Physician Assistant

## 2020-09-24 ENCOUNTER — Other Ambulatory Visit: Payer: Self-pay

## 2020-09-24 ENCOUNTER — Ambulatory Visit (HOSPITAL_COMMUNITY): Payer: Self-pay | Admitting: Physician Assistant

## 2020-09-24 ENCOUNTER — Encounter: Payer: Self-pay | Admitting: Physician Assistant

## 2020-09-24 ENCOUNTER — Ambulatory Visit (INDEPENDENT_AMBULATORY_CARE_PROVIDER_SITE_OTHER): Payer: Medicare HMO | Admitting: Physician Assistant

## 2020-09-24 VITALS — BP 128/76 | HR 86 | Temp 97.9°F | Ht 64.0 in | Wt 242.5 lb

## 2020-09-24 DIAGNOSIS — I10 Essential (primary) hypertension: Secondary | ICD-10-CM | POA: Diagnosis not present

## 2020-09-24 DIAGNOSIS — R32 Unspecified urinary incontinence: Secondary | ICD-10-CM | POA: Diagnosis not present

## 2020-09-24 DIAGNOSIS — R29818 Other symptoms and signs involving the nervous system: Secondary | ICD-10-CM

## 2020-09-24 DIAGNOSIS — R710 Precipitous drop in hematocrit: Secondary | ICD-10-CM | POA: Diagnosis not present

## 2020-09-24 DIAGNOSIS — G2581 Restless legs syndrome: Secondary | ICD-10-CM

## 2020-09-24 LAB — CBC WITH DIFFERENTIAL/PLATELET
Basophils Absolute: 0 10*3/uL (ref 0.0–0.1)
Basophils Relative: 0.8 % (ref 0.0–3.0)
Eosinophils Absolute: 0.2 10*3/uL (ref 0.0–0.7)
Eosinophils Relative: 3.3 % (ref 0.0–5.0)
HCT: 34.4 % — ABNORMAL LOW (ref 36.0–46.0)
Hemoglobin: 11.3 g/dL — ABNORMAL LOW (ref 12.0–15.0)
Lymphocytes Relative: 18.4 % (ref 12.0–46.0)
Lymphs Abs: 1 10*3/uL (ref 0.7–4.0)
MCHC: 33 g/dL (ref 30.0–36.0)
MCV: 87.6 fl (ref 78.0–100.0)
Monocytes Absolute: 0.5 10*3/uL (ref 0.1–1.0)
Monocytes Relative: 8.6 % (ref 3.0–12.0)
Neutro Abs: 3.7 10*3/uL (ref 1.4–7.7)
Neutrophils Relative %: 68.9 % (ref 43.0–77.0)
Platelets: 197 10*3/uL (ref 150.0–400.0)
RBC: 3.92 Mil/uL (ref 3.87–5.11)
RDW: 14.4 % (ref 11.5–15.5)
WBC: 5.4 10*3/uL (ref 4.0–10.5)

## 2020-09-24 LAB — FERRITIN: Ferritin: 16.1 ng/mL (ref 10.0–291.0)

## 2020-09-24 LAB — VITAMIN B12: Vitamin B-12: 368 pg/mL (ref 211–911)

## 2020-09-24 LAB — IRON: Iron: 79 ug/dL (ref 42–145)

## 2020-09-24 NOTE — Patient Instructions (Signed)
It was great to see you!  Referral placed for the sleep study provider to call you (neurology)  Update blood work today. Stool cards to send in.  Let's follow-up in 3 months, sooner if you have concerns.  If a referral was placed today, you will be contacted for an appointment. Please note that routine referrals can sometimes take up to 3-4 weeks to process. Please call our office if you haven't heard anything after this time frame.  Take care,  Jarold Motto PA-C

## 2020-09-24 NOTE — Progress Notes (Signed)
Tara Collier is a 75 y.o. female is here to discuss:  I acted as a Neurosurgeon for [employer], PA-C Corky Mull, LPN   History of Present Illness:   Chief Complaint  Patient presents with  . Hypertension    HPI   Hypertension Pt here for f/u on blood pressure, currently taking Carvedilol 12.5 mg BID, Losartan 100 mg daily. She is not checking blood pressure. Pt denies headaches, dizziness, blurred vision, chest pain, SOB or lower leg edema. Denies excessive caffeine intake, stimulant usage, excessive alcohol intake or increase in salt consumption.  Has seen cardiology since she last saw me. Planning on wearing a holter monitor and do an echo soon.   Urinary incontinence She went to [facility] since she last saw me. She is due to follow-up in March for a urodynamics and cystoscopy. Determined to have mixed incontinence and bed-wetting.   Neuropathy/RLS Has been taking gabapentin 300 mg as needed. She did not pick up the lower dose that we prescribed at the last visit because she forgot to pick this up.  She states that she is tolerating this.   Decreased hemoglobin Found on last lab check. Hgb was 11.2. I have no other prior labs to compare to. Denies: lightheadedness, dizziness, palpitations, rectal bleeding.   OSA Suspected sleep apnea. Would like a sleep study.     Health Maintenance Due  Topic Date Due  . Hepatitis C Screening  Never done  . COLONOSCOPY (Pts 45-19yrs Insurance coverage will need to be confirmed)  Never done  . MAMMOGRAM  Never done  . DEXA SCAN  Never done  . PNA vac Low Risk Adult (2 of 2 - PPSV23) 10/06/2018    Past Medical History:  Diagnosis Date  . Arthritis   . Atrial fibrillation (HCC) 06/03/2013   Last Assessment & Plan:  Formatting of this note might be different from the original. Continue propafenone for rhythm control .Patient has clear symptomatic benefit with sinus rhythm maintenance. CHADS-Vasc score over 2, continue  anticoagulation with eliquis.  Marland Kitchen HTN (hypertension) 06/03/2013   Last Assessment & Plan:  Formatting of this note might be different from the original. Hypertension is well controlled.  Continue current medical regimen and low sodium diet.  Reevaluate at next office visit  . Hypercholesterolemia   . Restless leg syndrome   . Urinary incontinence 08/08/2020     Social History   Tobacco Use  . Smoking status: Never Smoker  . Smokeless tobacco: Never Used  Substance Use Topics  . Alcohol use: No  . Drug use: No    Past Surgical History:  Procedure Laterality Date  . CARDIOVERSION    . RADIOFREQUENCY ABLATION    . ROTATOR CUFF REPAIR    . TUBAL LIGATION      History reviewed. No pertinent family history.  PMHx, SurgHx, SocialHx, FamHx, Medications, and Allergies were reviewed in the Visit Navigator and updated as appropriate.   Patient Active Problem List   Diagnosis Date Noted  . Urinary incontinence 08/08/2020  . Arthritis   . Hypercholesterolemia   . Restless leg syndrome   . Atrial fibrillation (HCC) 06/03/2013  . HTN (hypertension) 06/03/2013    Social History   Tobacco Use  . Smoking status: Never Smoker  . Smokeless tobacco: Never Used  Substance Use Topics  . Alcohol use: No  . Drug use: No    Current Medications and Allergies:    Current Outpatient Medications:  .  Apixaban (ELIQUIS PO), Take 5  mg by mouth 2 (two) times daily., Disp: , Rfl:  .  atorvastatin (LIPITOR) 40 MG tablet, Take 1 tablet (40 mg total) by mouth daily., Disp: 90 tablet, Rfl: 1 .  carvedilol (COREG) 12.5 MG tablet, Take 12.5 mg by mouth 2 (two) times daily with a meal., Disp: , Rfl:  .  diclofenac Sodium (VOLTAREN) 1 % GEL, Apply 2 g topically 4 (four) times daily., Disp: , Rfl:  .  gabapentin (NEURONTIN) 100 MG capsule, Take 1 capsule (100 mg total) by mouth at bedtime., Disp: 90 capsule, Rfl: 1 .  losartan (COZAAR) 100 MG tablet, Take 1 tablet (100 mg total) by mouth daily., Disp:  90 tablet, Rfl: 1 .  meclizine (ANTIVERT) 25 MG tablet, Take 25 mg by mouth 3 (three) times daily as needed for dizziness., Disp: , Rfl:  .  mirabegron ER (MYRBETRIQ) 50 MG TB24 tablet, Take 1 tablet (50 mg total) by mouth daily., Disp: 90 tablet, Rfl: 1 .  polyethylene glycol powder (GLYCOLAX/MIRALAX) 17 GM/SCOOP powder, Take 1 g by mouth daily as needed., Disp: , Rfl:    Allergies  Allergen Reactions  . Tape Rash  . Bee Venom Rash    Review of Systems   ROS  Negative unless otherwise specified per HPI.  Vitals:   Vitals:   09/24/20 0950  BP: 128/76  Pulse: 86  Temp: 97.9 F (36.6 C)  TempSrc: Temporal  SpO2: 97%  Weight: 242 lb 8 oz (110 kg)  Height: 5\' 4"  (1.626 m)     Body mass index is 41.63 kg/m.   Physical Exam:    Physical Exam Vitals and nursing note reviewed.  Constitutional:      General: She is not in acute distress.    Appearance: She is well-developed. She is not ill-appearing, toxic-appearing or sickly-appearing.  Cardiovascular:     Rate and Rhythm: Normal rate. Rhythm irregular.     Pulses: Normal pulses.     Heart sounds: Normal heart sounds, S1 normal and S2 normal.     Comments: No LE edema Pulmonary:     Effort: Pulmonary effort is normal.     Breath sounds: Normal breath sounds.  Skin:    General: Skin is warm, dry and intact.  Neurological:     Mental Status: She is alert.     GCS: GCS eye subscore is 4. GCS verbal subscore is 5. GCS motor subscore is 6.  Psychiatric:        Mood and Affect: Mood and affect normal.        Speech: Speech normal.        Behavior: Behavior normal. Behavior is cooperative.      Assessment and Plan:    Tara Collier was seen today for hypertension.  Diagnoses and all orders for this visit:  Primary hypertension Well controlled. Management per cardiology. Follow-up as indicated with specialist.  Urinary incontinence, unspecified type Improving. Management per urology. Follow-up as indicated with  specialist.  Restless leg syndrome Recommend trialing gabapentin 100 mg daily when her current gabapentin runs out to help with sedation issues. Follow-up as needed.  Suspected sleep apnea -     Ambulatory referral to Sleep Studies  Decreased hemoglobin Update blood work today to monitor trend. Stool cards as well have been ordered. Colonoscopy has been ordered but not scheduled yet per daughter. -     CBC with Differential/Platelet -     Ferritin -     Iron -     Vitamin B12 -  Fecal occult blood, imunochemical; Future   CMA or LPN served as scribe during this visit. History, Physical, and Plan performed by medical provider. The above documentation has been reviewed and is accurate and complete.  Time spent with patient today was 25 minutes which consisted of chart review, discussing diagnosis, work up, treatment answering questions and documentation.   Jarold Motto, PA-C [place], [place] 09/24/2020  Follow-up: No follow-ups on file.

## 2020-10-03 ENCOUNTER — Telehealth: Payer: Self-pay

## 2020-10-03 NOTE — Telephone Encounter (Signed)
..   LAST APPOINTMENT DATE: 09/24/2020   NEXT APPOINTMENT DATE:@5 /09/2020  MEDICATION:carvedilol (COREG) 12.5 MG tablet losartan (COZAAR) 100 MG tablet  Apixaban (ELIQUIS PO)     PHARMACY:[id number] - , [place] - [address]

## 2020-10-04 MED ORDER — MECLIZINE HCL 25 MG PO TABS: 25.0000 mg | ORAL_TABLET | Freq: Three times a day (TID) | ORAL | 2 refills | Status: DC | PRN

## 2020-10-04 MED ORDER — LOSARTAN POTASSIUM 100 MG PO TABS: 100.0000 mg | ORAL_TABLET | Freq: Every day | ORAL | 1 refills | Status: DC

## 2020-10-04 NOTE — Telephone Encounter (Signed)
Spoke to pt, told her Cardiology monitors her for A-fib with her heart and Lelon Mast has never filled those medications for you. Please call Cardiology for refills on Eliquis & Carvedilol. Pt verbalized understanding and said she needs a refill on Meclizine. Told her I will send Rx to pharmacy for her. Pt verbalized understanding.

## 2020-10-04 NOTE — Telephone Encounter (Signed)
Patient is requesting Lupita Leash give her a call, to further explain.

## 2020-10-04 NOTE — Telephone Encounter (Signed)
Patient is calling in stating she does not have a cardiologist to refill Coreg and Eliquis, and is almost out.

## 2020-10-04 NOTE — Telephone Encounter (Signed)
Left message on voicemail to call office. Rx sent to pharmacy for Losartan. Pt needs to contact Cardiology for Eliquis and Carvedilol.

## 2020-10-05 ENCOUNTER — Telehealth: Payer: Self-pay | Admitting: Internal Medicine

## 2020-10-05 ENCOUNTER — Ambulatory Visit (INDEPENDENT_AMBULATORY_CARE_PROVIDER_SITE_OTHER): Payer: Medicare HMO

## 2020-10-05 ENCOUNTER — Ambulatory Visit (HOSPITAL_COMMUNITY): Payer: Medicare HMO | Attending: Cardiology

## 2020-10-05 ENCOUNTER — Other Ambulatory Visit: Payer: Self-pay

## 2020-10-05 DIAGNOSIS — R0600 Dyspnea, unspecified: Secondary | ICD-10-CM

## 2020-10-05 DIAGNOSIS — R600 Localized edema: Secondary | ICD-10-CM

## 2020-10-05 DIAGNOSIS — R0609 Other forms of dyspnea: Secondary | ICD-10-CM

## 2020-10-05 DIAGNOSIS — I4891 Unspecified atrial fibrillation: Secondary | ICD-10-CM

## 2020-10-05 LAB — ECHOCARDIOGRAM COMPLETE: S' Lateral: 2.7 cm

## 2020-10-05 MED ORDER — CARVEDILOL 12.5 MG PO TABS: 12.5000 mg | ORAL_TABLET | Freq: Two times a day (BID) | ORAL | 3 refills | Status: DC

## 2020-10-05 MED ORDER — APIXABAN 5 MG PO TABS: 5.0000 mg | ORAL_TABLET | Freq: Two times a day (BID) | ORAL | 3 refills | Status: DC

## 2020-10-05 NOTE — Telephone Encounter (Signed)
*  STAT* If patient is at the pharmacy, call can be transferred to refill team.   1. Which medications need to be refilled? (please list name of each medication and dose if known)  Apixaban (ELIQUIS PO) carvedilol (COREG) 12.5 MG tablet    2. Which pharmacy/location (including street and city if local pharmacy) is medication to be sent to? [id number] - [place], [place] - [address]  3. Do they need a 30 day or 90 day supply? 90  Patient is almost out of this medication. Please advise.

## 2020-10-05 NOTE — Telephone Encounter (Signed)
I do not see where Dr. Jacques Navy filled medication. Please advise.

## 2020-10-05 NOTE — Telephone Encounter (Signed)
Per Dr. Jacques Navy- okay to refill Eliquis 5mg  Twice Daily and Carvedilol 12.5mg  Twice Daily  Refills have been sent in to Pharmacy

## 2020-10-05 NOTE — Telephone Encounter (Signed)
   Riva with [facility] calling for heart monitor critical result

## 2020-10-05 NOTE — Telephone Encounter (Signed)
Received a critical monitor report for [facility] showing initial report of afib with HR of 60-70 bmp.  Report reviewed by Dr. Jacques Navy who recommend no changes and continue to moniotr.

## 2020-10-12 MED ORDER — PERFLUTREN LIPID MICROSPHERE
1.0000 mL | INTRAVENOUS | Status: AC | PRN
  Administered 2020-10-12: 2 mL via INTRAVENOUS

## 2020-10-15 ENCOUNTER — Encounter (HOSPITAL_COMMUNITY): Payer: Self-pay | Admitting: Physician Assistant

## 2020-10-15 ENCOUNTER — Ambulatory Visit (HOSPITAL_COMMUNITY)
Admission: RE | Admit: 2020-10-15 | Discharge: 2020-10-15 | Disposition: A | Discharge status: Home / Self Care | Payer: Medicare HMO | Source: Ambulatory Visit | Attending: Physician Assistant | Admitting: Physician Assistant

## 2020-10-15 ENCOUNTER — Other Ambulatory Visit: Payer: Self-pay

## 2020-10-15 VITALS — BP 128/88 | HR 60 | Ht 64.0 in | Wt 242.8 lb

## 2020-10-15 DIAGNOSIS — I4811 Longstanding persistent atrial fibrillation: Secondary | ICD-10-CM

## 2020-10-15 DIAGNOSIS — E669 Obesity, unspecified: Secondary | ICD-10-CM | POA: Insufficient documentation

## 2020-10-15 DIAGNOSIS — Z79899 Other long term (current) drug therapy: Secondary | ICD-10-CM | POA: Insufficient documentation

## 2020-10-15 DIAGNOSIS — E785 Hyperlipidemia, unspecified: Secondary | ICD-10-CM | POA: Insufficient documentation

## 2020-10-15 DIAGNOSIS — D6869 Other thrombophilia: Secondary | ICD-10-CM | POA: Diagnosis not present

## 2020-10-15 DIAGNOSIS — I4819 Other persistent atrial fibrillation: Secondary | ICD-10-CM | POA: Diagnosis not present

## 2020-10-15 DIAGNOSIS — Z7901 Long term (current) use of anticoagulants: Secondary | ICD-10-CM | POA: Insufficient documentation

## 2020-10-15 DIAGNOSIS — R0600 Dyspnea, unspecified: Secondary | ICD-10-CM

## 2020-10-15 DIAGNOSIS — Z6841 Body Mass Index (BMI) 40.0 and over, adult: Secondary | ICD-10-CM | POA: Diagnosis not present

## 2020-10-15 DIAGNOSIS — I10 Essential (primary) hypertension: Secondary | ICD-10-CM | POA: Diagnosis not present

## 2020-10-15 DIAGNOSIS — R0609 Other forms of dyspnea: Secondary | ICD-10-CM | POA: Insufficient documentation

## 2020-10-15 NOTE — Progress Notes (Signed)
Primary Care Physician: Jarold Motto, [place] Primary Cardiologist: Dr Jacques Navy  Primary Electrophysiologist: none Referring Physician: Dr Avel Sensor is a 75 y.o. female with a history of HTN, HLD, atrial fibrillation, atrial flutter who presents for consultation in the [facility]. The patient was initially diagnosed with atrial fibrillation and atrial flutter remotely and underwent flutter ablation in 2013. She has previously been on propafenone but she was lost to cardiology follow up and discontinued this on her own. Patient is on Eliquis for a CHADS2VASC score of 3. She reports dyspnea with exertion for the last 2 years. She is unsure how long she has been in afib. She denies any bleeding issues on anticoagulation. She has a consult with sleep medicine upcoming.   Today, she denies symptoms of palpitations, chest pain, orthopnea, PND, lower extremity edema, dizziness, presyncope, syncope, bleeding, or neurologic sequela. The patient is tolerating medications without difficulties and is otherwise without complaint today.    Atrial Fibrillation Risk Factors:  she does have symptoms or diagnosis of sleep apnea. Sleep consult pending. she does not have a history of rheumatic fever. she does not have a history of alcohol use. The patient does not have a history of early familial atrial fibrillation or other arrhythmias.  she has a BMI of Body mass index is 41.68 kg/m.Marland Kitchen Filed Weights   10/15/20 0931  Weight: 110.1 kg    No family history on file.   Atrial Fibrillation Management history:  Previous antiarrhythmic drugs: propafenone  Previous cardioversions: 2013 Previous ablations: 07/19/12 (flutter) CHADS2VASC score: 3 Anticoagulation history: Eliquis   Past Medical History:  Diagnosis Date  . Arthritis   . Atrial fibrillation (HCC) 06/03/2013   Last Assessment & Plan:  Formatting of this note might be different from the original.  Continue propafenone for rhythm control .Patient has clear symptomatic benefit with sinus rhythm maintenance. CHADS-Vasc score over 2, continue anticoagulation with eliquis.  Marland Kitchen HTN (hypertension) 06/03/2013   Last Assessment & Plan:  Formatting of this note might be different from the original. Hypertension is well controlled.  Continue current medical regimen and low sodium diet.  Reevaluate at next office visit  . Hypercholesterolemia   . Restless leg syndrome   . Urinary incontinence 08/08/2020   Past Surgical History:  Procedure Laterality Date  . CARDIOVERSION    . RADIOFREQUENCY ABLATION    . ROTATOR CUFF REPAIR    . TUBAL LIGATION      Current Outpatient Medications  Medication Sig Dispense Refill  . apixaban (ELIQUIS) 5 MG TABS tablet Take 1 tablet (5 mg total) by mouth 2 (two) times daily. 60 tablet 3  . atorvastatin (LIPITOR) 40 MG tablet Take 1 tablet (40 mg total) by mouth daily. 90 tablet 1  . carvedilol (COREG) 12.5 MG tablet Take 1 tablet (12.5 mg total) by mouth 2 (two) times daily with a meal. 60 tablet 3  . diclofenac Sodium (VOLTAREN) 1 % GEL Apply 2 g topically 4 (four) times daily.    Marland Kitchen gabapentin (NEURONTIN) 100 MG capsule Take 1 capsule (100 mg total) by mouth at bedtime. 90 capsule 1  . losartan (COZAAR) 100 MG tablet Take 1 tablet (100 mg total) by mouth daily. 90 tablet 1  . meclizine (ANTIVERT) 25 MG tablet Take 1 tablet (25 mg total) by mouth 3 (three) times daily as needed for dizziness. 30 tablet 2  . mirabegron ER (MYRBETRIQ) 50 MG TB24 tablet Take 1 tablet (50 mg total)  by mouth daily. 90 tablet 1  . polyethylene glycol powder (GLYCOLAX/MIRALAX) 17 GM/SCOOP powder Take 1 g by mouth daily as needed.     No current facility-administered medications for this encounter.    Allergies  Allergen Reactions  . Tape Rash  . Bee Venom Rash    Social History   Socioeconomic History  . Marital status: Single    Spouse name: Not on file  . Number of  children: Not on file  . Years of education: Not on file  . Highest education level: Not on file  Occupational History  . Not on file  Tobacco Use  . Smoking status: Never Smoker  . Smokeless tobacco: Never Used  Substance and Sexual Activity  . Alcohol use: No  . Drug use: No  . Sexual activity: Not on file  Other Topics Concern  . Not on file  Social History Narrative   Daughter in [place]   Lives by herself   Worked for [employer] for 20+ years   Social Determinants of Health   Financial Resource Strain: Not on file  Food Insecurity: Not on file  Transportation Needs: Not on file  Physical Activity: Not on file  Stress: Not on file  Social Connections: Not on file  Intimate Partner Violence: Not on file     ROS- All systems are reviewed and negative except as per the HPI above.  Physical Exam: Vitals:   10/15/20 0931  BP: 128/88  Pulse: 60  Weight: 110.1 kg  Height: 5\' 4"  (1.626 m)    GEN- The patient is well appearing obese female, alert and oriented x 3 today.   Head- normocephalic, atraumatic Eyes-  Sclera clear, conjunctiva pink Ears- hearing intact Oropharynx- clear Neck- supple  Lungs- Clear to ausculation bilaterally, normal work of breathing Heart- irregular rate and rhythm, no murmurs, rubs or gallops  GI- soft, NT, ND, + BS Extremities- no clubbing, cyanosis. 1+ bilateral edema MS- no significant deformity or atrophy Skin- no rash or lesion Psych- euthymic mood, full affect Neuro- strength and sensation are intact  Wt Readings from Last 3 Encounters:  10/15/20 110.1 kg  09/24/20 110 kg  09/13/20 112.5 kg    EKG today demonstrates  Afib Vent. rate 60 BPM QRS duration 80 ms QT/QTc 434/434 ms  Echo 10/05/20 demonstrated  1. Left ventricular ejection fraction, by estimation, is 60 to 65%. The  left ventricle has normal function. The left ventricle has no regional  wall motion abnormalities. Left ventricular diastolic function could not  be  evaluated.  2. Right ventricular systolic function is normal. The right ventricular  size is mildly enlarged. There is mildly elevated pulmonary artery  systolic pressure. The estimated right ventricular systolic pressure is  44.0 mmHg.  3. Right atrial size was moderately dilated.  4. The mitral valve is normal in structure. No evidence of mitral valve  regurgitation. No evidence of mitral stenosis.  5. The aortic valve is tricuspid. Aortic valve regurgitation is not  visualized. Mild aortic valve sclerosis is present, with no evidence of  aortic valve stenosis.  6. The inferior vena cava is normal in size with greater than 50%  respiratory variability, suggesting right atrial pressure of 3 mmHg.   LA 5.8 cm  Epic records are reviewed at length today  CHA2DS2-VASc Score = 3  The patient's score is based upon: CHF History: No HTN History: Yes Diabetes History: No Stroke History: No Vascular Disease History: No Age Score: 1 Gender Score: 1  ASSESSMENT AND PLAN: 1. Longstanding Persistent Atrial Fibrillation/typical atrial flutter The patient's CHA2DS2-VASc score is 3, indicating a 3.2% annual risk of stroke.   We discussed therapeutic options today. Unclear duration of afib however, given the chronicity of her symptoms and the size of her LA, I suspect her afib is longstanding. We discussed AAD + DCCV vs rate control. Patient would like a trial of SR to see if she feels improved. She has tolerated propafenone in the past. D/w Dr Jacques Navy, will arrange for stress test prior to initiating propafenone. If no ischemia on stress test, would start propafenone 225 mg BID and schedule DCCV. Patient in agreement with plan. Ultimately, rate control may be her best option.   Continue Eliquis 5 mg BID Continue Coreg 12.5 mg BID  2. Secondary Hypercoagulable State (ICD10:  D68.69) The patient is at significant risk for stroke/thromboembolism based upon her CHA2DS2-VASc Score of 3.   Continue Apixaban (Eliquis).   3. Obesity Body mass index is 41.68 kg/m. Lifestyle modification was discussed at length including regular exercise and weight reduction.  4. Suspected OSA The importance of adequate treatment of sleep apnea was discussed today in order to improve our ability to maintain sinus rhythm long term. Consult with sleep medicine pending.  5. HTN Stable, no changes today.  6. Dyspnea on exertion Anginal equivalent vs afib Stress test as above.   Follow up with Dr Jacques Navy as scheduled.    Jorja Loa PA-C [facility] [address] [place], [place] [address] [phone] 10/15/2020 11:55 AM

## 2020-10-18 ENCOUNTER — Institutional Professional Consult (permissible substitution): Payer: Self-pay | Admitting: Neurology

## 2020-10-18 ENCOUNTER — Telehealth: Payer: Self-pay | Admitting: Neurology

## 2020-10-18 NOTE — Telephone Encounter (Signed)
Patient was a no show to the sleep consult apt

## 2020-10-24 DIAGNOSIS — N3946 Mixed incontinence: Secondary | ICD-10-CM | POA: Diagnosis not present

## 2020-10-25 ENCOUNTER — Telehealth (HOSPITAL_COMMUNITY): Payer: Self-pay | Admitting: *Deleted

## 2020-10-25 NOTE — Telephone Encounter (Signed)
Patient given detailed instructions per Myocardial Perfusion Study Information Sheet for the test on 10/29/20. Patient notified to arrive 15 minutes early and that it is imperative to arrive on time for appointment to keep from having the test rescheduled.  If you need to cancel or reschedule your appointment, please call the office within 24 hours of your appointment. . Patient verbalized understanding. Jasmia Angst Jacqueline    

## 2020-10-29 ENCOUNTER — Ambulatory Visit (HOSPITAL_COMMUNITY): Payer: Medicare HMO | Attending: Internal Medicine

## 2020-10-29 ENCOUNTER — Other Ambulatory Visit: Payer: Self-pay

## 2020-10-29 ENCOUNTER — Encounter (INDEPENDENT_AMBULATORY_CARE_PROVIDER_SITE_OTHER): Payer: Self-pay

## 2020-10-29 DIAGNOSIS — R0609 Other forms of dyspnea: Secondary | ICD-10-CM

## 2020-10-29 DIAGNOSIS — R0600 Dyspnea, unspecified: Secondary | ICD-10-CM | POA: Diagnosis not present

## 2020-10-29 MED ORDER — TECHNETIUM TC 99M TETROFOSMIN IV KIT
30.6000 | PACK | Freq: Once | INTRAVENOUS | Status: AC | PRN
  Administered 2020-10-29: 30.6 via INTRAVENOUS
  Filled 2020-10-29: qty 31

## 2020-10-29 MED ORDER — REGADENOSON 0.4 MG/5ML IV SOLN
0.4000 mg | Freq: Once | INTRAVENOUS | Status: AC
  Administered 2020-10-29: 0.4 mg via INTRAVENOUS

## 2020-10-30 ENCOUNTER — Ambulatory Visit (HOSPITAL_COMMUNITY): Payer: Medicare HMO | Attending: Cardiology

## 2020-10-30 LAB — MYOCARDIAL PERFUSION IMAGING
LV dias vol: 81 mL (ref 46–106)
LV sys vol: 30 mL
Peak HR: 84 {beats}/min
Rest HR: 73 {beats}/min
SDS: 3
SRS: 0
SSS: 3
TID: 0

## 2020-10-30 MED ORDER — TECHNETIUM TC 99M TETROFOSMIN IV KIT
31.4000 | PACK | Freq: Once | INTRAVENOUS | Status: AC | PRN
  Administered 2020-10-30: 31.4 via INTRAVENOUS
  Filled 2020-10-30: qty 32

## 2020-11-02 ENCOUNTER — Telehealth: Payer: Self-pay | Admitting: Physician Assistant

## 2020-11-02 NOTE — Telephone Encounter (Signed)
Left message for patient to call back and schedule Medicare Annual Wellness Visit (AWV) either virtually or in office. No detailed message left   AWVI  please schedule at anytime   This should be a 45 minute visit. 

## 2020-11-05 ENCOUNTER — Encounter: Payer: Self-pay | Admitting: Internal Medicine

## 2020-11-05 ENCOUNTER — Other Ambulatory Visit: Payer: Self-pay

## 2020-11-05 ENCOUNTER — Ambulatory Visit: Payer: Medicare HMO | Admitting: Internal Medicine

## 2020-11-05 VITALS — BP 148/90 | HR 70 | Ht 64.0 in | Wt 250.0 lb

## 2020-11-05 DIAGNOSIS — R600 Localized edema: Secondary | ICD-10-CM | POA: Diagnosis not present

## 2020-11-05 DIAGNOSIS — E7800 Pure hypercholesterolemia, unspecified: Secondary | ICD-10-CM

## 2020-11-05 DIAGNOSIS — D6869 Other thrombophilia: Secondary | ICD-10-CM | POA: Diagnosis not present

## 2020-11-05 DIAGNOSIS — I4891 Unspecified atrial fibrillation: Secondary | ICD-10-CM | POA: Diagnosis not present

## 2020-11-05 DIAGNOSIS — R0600 Dyspnea, unspecified: Secondary | ICD-10-CM

## 2020-11-05 DIAGNOSIS — R0609 Other forms of dyspnea: Secondary | ICD-10-CM

## 2020-11-05 DIAGNOSIS — I10 Essential (primary) hypertension: Secondary | ICD-10-CM | POA: Diagnosis not present

## 2020-11-05 NOTE — Progress Notes (Signed)
Cardiology Office Note:    Date:  11/05/2020   ID:  Tara Collier, DOB [birth year], MRN [id number]  PCP:  Jarold Motto, PA  Cardiologist:  No primary care provider on file.  Electrophysiologist:  None   Referring MD: Jarold Motto, PA   Chief Complaint/Reason for Referral: HTN, HLD, afib.   History of Present Illness:    Tara Collier is a 75 y.o. female with a history of HTN, HLD, afib. Presents for further evaluation of afib. Presents with daughter who provides some of the history.   Concerns about compliance with eliiquis, timing may be off but not sure if any missed doses. In the setting of this, not eager to trial cardioversion. I have shared this with patient and daughter.  Reviewed results from echo, nuc, and monitor. Nuc ordered by afib clinic, no ischemia or infarction. Patient was only able to wear monitor for 7 hours but had Afib nearly the entire time. Echo grossly normal.  The patient denies chest pain, chest pressure, dyspnea at rest, palpitations, PND, orthopnea, or leg swelling. Denies cough, fever, chills. Denies nausea, vomiting. Denies syncope or presyncope. Denies dizziness or lightheadedness.    Past Medical History:  Diagnosis Date   Arthritis    Atrial fibrillation (HCC) 06/03/2013   Last Assessment & Plan:  Formatting of this note might be different from the original. Continue propafenone for rhythm control .Patient has clear symptomatic benefit with sinus rhythm maintenance. CHADS-Vasc score over 2, continue anticoagulation with eliquis.   HTN (hypertension) 06/03/2013   Last Assessment & Plan:  Formatting of this note might be different from the original. Hypertension is well controlled.  Continue current medical regimen and low sodium diet.  Reevaluate at next office visit   Hypercholesterolemia    Restless leg syndrome    Urinary incontinence 08/08/2020    Past Surgical History:  Procedure Laterality Date   CARDIOVERSION     RADIOFREQUENCY  ABLATION     ROTATOR CUFF REPAIR     TUBAL LIGATION      Current Medications: Current Meds  Medication Sig   apixaban (ELIQUIS) 5 MG TABS tablet Take 1 tablet (5 mg total) by mouth 2 (two) times daily.   atorvastatin (LIPITOR) 40 MG tablet Take 1 tablet (40 mg total) by mouth daily.   carvedilol (COREG) 12.5 MG tablet Take 1 tablet (12.5 mg total) by mouth 2 (two) times daily with a meal.   diclofenac Sodium (VOLTAREN) 1 % GEL Apply 2 g topically 4 (four) times daily.   gabapentin (NEURONTIN) 100 MG capsule Take 1 capsule (100 mg total) by mouth at bedtime.   losartan (COZAAR) 100 MG tablet Take 1 tablet (100 mg total) by mouth daily.   meclizine (ANTIVERT) 25 MG tablet Take 1 tablet (25 mg total) by mouth 3 (three) times daily as needed for dizziness.   mirabegron ER (MYRBETRIQ) 50 MG TB24 tablet Take 1 tablet (50 mg total) by mouth daily.   polyethylene glycol powder (GLYCOLAX/MIRALAX) 17 GM/SCOOP powder Take 1 g by mouth daily as needed.     Allergies:   Tape and Bee venom   Social History   Tobacco Use   Smoking status: Never Smoker   Smokeless tobacco: Never Used  Substance Use Topics   Alcohol use: No   Drug use: No     Family History: The patient's family history is not on file.  ROS:   Please see the history of present illness.    All other systems reviewed and are  negative.  EKGs/Labs/Other Studies Reviewed:    The following studies were reviewed today:  EKG:  Afib rate 70, septal infarct pattern.  I have independently reviewed the images from NM stress test 10/30/20.  Recent Labs: 08/08/2020: ALT 16; BUN 18; Creat 0.79; Potassium 4.7; Sodium 141 09/24/2020: Hemoglobin 11.3; Platelets 197.0  Recent Lipid Panel    Component Value Date/Time   CHOL 236 (H) 08/08/2020 0935   TRIG 120 08/08/2020 0935   HDL 45 (L) 08/08/2020 0935   CHOLHDL 5.2 (H) 08/08/2020 0935   LDLCALC 166 (H) 08/08/2020 0935    Physical Exam:    VS:  BP (!) 148/90    Pulse 70    Ht 5'  4" (1.626 m)    Wt 250 lb (113.4 kg)    SpO2 97%    BMI 42.91 kg/m     Wt Readings from Last 5 Encounters:  11/05/20 250 lb (113.4 kg)  10/29/20 242 lb (109.8 kg)  10/15/20 242 lb 12.8 oz (110.1 kg)  09/24/20 242 lb 8 oz (110 kg)  09/13/20 248 lb (112.5 kg)    Constitutional: No acute distress Eyes: sclera non-icteric, normal conjunctiva and lids ENMT: normal dentition, moist mucous membranes Cardiovascular: regular rhythm, normal rate, no murmurs. S1 and S2 normal. Radial pulses normal bilaterally. No jugular venous distention.  Respiratory: clear to auscultation bilaterally GI : normal bowel sounds, soft and nontender. No distention.   MSK: extremities warm, well perfused. No edema.  NEURO: grossly nonfocal exam, moves all extremities. PSYCH: alert and oriented x 3, normal mood and affect.   ASSESSMENT:    1. Atrial fibrillation, unspecified type (HCC)   2. Secondary hypercoagulable state (HCC)   3. DOE (dyspnea on exertion)   4. Lower extremity edema   5. Hypercholesterolemia   6. Primary hypertension    PLAN:    Atrial fibrillation, unspecified type (HCC) - Plan: EKG 12-Lead Secondary hypercoagulable state (HCC) -recently seen by Afib clinic given history of propafenone use. Determined that if no ischemia on nuc could start propafenone. However, I remain concerned about compliance with DOAC and would like her to follow this and receive any AAD script and DCCV direction from afib clinic. Patient's daughter unclear on why there are different specialties following, and I have explained our varied roles.  - continue eliquis 5 mg BID  - continue carvedilol 12.5 mg BID  DOE (dyspnea on exertion) - echo grossly normal and no ischemia on nuc. Maybe secondary to Afib, may feel better in SR.   Lower extremity edema - encourage compression stockings, can consider lasix for swelling as well.   Hypercholesterolemia - conitnue atorvastatin 40 mg daily  Primary hypertension -  continue BB, losartan  Total time of encounter: 30 minutes total time of encounter, including 25 minutes spent in face-to-face patient care on the date of this encounter. This time includes coordination of care and counseling regarding above mentioned problem list. Remainder of non-face-to-face time involved reviewing chart documents/testing relevant to the patient encounter and documentation in the medical record. I have independently reviewed documentation from referring provider.   Weston Brass, MD, [facility] [place]   [facility]    Medication Adjustments/Labs and Tests Ordered: Current medicines are reviewed at length with the patient today.  Concerns regarding medicines are outlined above.   Orders Placed This Encounter  Procedures   EKG 12-Lead    Shared Decision Making/Informed Consent:       No orders of the defined types were placed in  this encounter.   Patient Instructions  Medication Instructions:  No Changes In Medications at this time.  *If you need a refill on your cardiac medications before your next appointment, please call your pharmacy*  Follow-Up: At [facility], you and your health needs are our priority.  As part of our continuing mission to provide you with exceptional heart care, we have created designated Provider Care Teams.  These Care Teams include your primary Cardiologist (physician) and Advanced Practice Providers (APPs -  Physician Assistants and Nurse Practitioners) who all work together to provide you with the care you need, when you need it.  We recommend signing up for the patient portal called "MyChart".  Sign up information is provided on this After Visit Summary.  MyChart is used to connect with patients for Virtual Visits (Telemedicine).  Patients are able to view lab/test results, encounter notes, upcoming appointments, etc.  Non-urgent messages can be sent to your provider as well.   To learn more about what you can do with MyChart, go  to [email or web address].    Your next appointment:   3 month(s)  The format for your next appointment:   In Person  Provider:   You will see one of the following Advanced Practice Providers on your designated Care Team:   Theodore Demark, PA-C Joni Reining, DNP, ANP Any APP

## 2020-11-05 NOTE — Patient Instructions (Signed)
Medication Instructions:  No Changes In Medications at this time.  *If you need a refill on your cardiac medications before your next appointment, please call your pharmacy*  Follow-Up: At [facility], you and your health needs are our priority.  As part of our continuing mission to provide you with exceptional heart care, we have created designated Provider Care Teams.  These Care Teams include your primary Cardiologist (physician) and Advanced Practice Providers (APPs -  Physician Assistants and Nurse Practitioners) who all work together to provide you with the care you need, when you need it.  We recommend signing up for the patient portal called "MyChart".  Sign up information is provided on this After Visit Summary.  MyChart is used to connect with patients for Virtual Visits (Telemedicine).  Patients are able to view lab/test results, encounter notes, upcoming appointments, etc.  Non-urgent messages can be sent to your provider as well.   To learn more about what you can do with MyChart, go to [email or web address].    Your next appointment:   3 month(s)  The format for your next appointment:   In Person  Provider:   You will see one of the following Advanced Practice Providers on your designated Care Team:    Theodore Demark, PA-C  Joni Reining, DNP, ANP  Any APP

## 2020-11-06 ENCOUNTER — Telehealth (HOSPITAL_COMMUNITY): Payer: Self-pay | Admitting: Physician Assistant

## 2020-11-06 NOTE — Telephone Encounter (Signed)
Called and left message for patient to call back.  Needs f/u appt in a few weeks to discuss AAD per Jorja Loa, PA.

## 2020-11-12 NOTE — Telephone Encounter (Signed)
Called and left 2nd VM for patient/daughter to call back and schedule appt.

## 2020-11-14 DIAGNOSIS — N3946 Mixed incontinence: Secondary | ICD-10-CM | POA: Diagnosis not present

## 2020-11-14 DIAGNOSIS — R350 Frequency of micturition: Secondary | ICD-10-CM | POA: Diagnosis not present

## 2020-12-04 ENCOUNTER — Encounter (HOSPITAL_COMMUNITY): Payer: Self-pay | Admitting: Physician Assistant

## 2020-12-04 NOTE — Telephone Encounter (Signed)
Letter mailed to patient asking her to contact Clinic to schedule appt.

## 2020-12-24 ENCOUNTER — Ambulatory Visit: Payer: Medicare HMO | Admitting: Physician Assistant

## 2021-01-01 DIAGNOSIS — N3946 Mixed incontinence: Secondary | ICD-10-CM | POA: Diagnosis not present

## 2021-01-01 DIAGNOSIS — N3944 Nocturnal enuresis: Secondary | ICD-10-CM | POA: Diagnosis not present

## 2021-01-15 ENCOUNTER — Encounter: Payer: Self-pay | Admitting: Family Medicine

## 2021-01-15 ENCOUNTER — Other Ambulatory Visit: Payer: Self-pay

## 2021-01-15 ENCOUNTER — Ambulatory Visit (INDEPENDENT_AMBULATORY_CARE_PROVIDER_SITE_OTHER): Payer: Medicare HMO | Admitting: Family Medicine

## 2021-01-15 VITALS — BP 153/77 | HR 73 | Temp 98.0°F | Ht 64.0 in | Wt 245.8 lb

## 2021-01-15 DIAGNOSIS — D6869 Other thrombophilia: Secondary | ICD-10-CM

## 2021-01-15 DIAGNOSIS — I4811 Longstanding persistent atrial fibrillation: Secondary | ICD-10-CM

## 2021-01-15 DIAGNOSIS — I10 Essential (primary) hypertension: Secondary | ICD-10-CM

## 2021-01-15 DIAGNOSIS — R32 Unspecified urinary incontinence: Secondary | ICD-10-CM | POA: Diagnosis not present

## 2021-01-15 DIAGNOSIS — M1990 Unspecified osteoarthritis, unspecified site: Secondary | ICD-10-CM

## 2021-01-15 MED ORDER — LOSARTAN POTASSIUM 100 MG PO TABS: 100.0000 mg | ORAL_TABLET | Freq: Every day | ORAL | 3 refills | Status: DC

## 2021-01-15 MED ORDER — ELIQUIS 5 MG PO TABS: 5.0000 mg | ORAL_TABLET | Freq: Two times a day (BID) | ORAL | 3 refills | Status: DC

## 2021-01-15 MED ORDER — GABAPENTIN 100 MG PO CAPS: 100.0000 mg | ORAL_CAPSULE | Freq: Every day | ORAL | 3 refills | Status: DC

## 2021-01-15 MED ORDER — MECLIZINE HCL 25 MG PO TABS: 25.0000 mg | ORAL_TABLET | Freq: Three times a day (TID) | ORAL | 3 refills | Status: DC | PRN

## 2021-01-15 MED ORDER — ATORVASTATIN CALCIUM 40 MG PO TABS: 40.0000 mg | ORAL_TABLET | Freq: Every day | ORAL | 3 refills | Status: DC

## 2021-01-15 NOTE — Assessment & Plan Note (Signed)
Managed by cardiology.  She is anticoagulated on Eliquis.  Will refill today.

## 2021-01-15 NOTE — Progress Notes (Signed)
   Tara Collier is a 75 y.o. female who presents today for an office visit.  Assessment/Plan:  Chronic Problems Addressed Today: Arthritis Place referral to orthopedics.  Urinary incontinence Follows with urology.  She has recently started on Gemesa.  We will check urine culture.  She has been on Cipro for recent UTI diagnosis.  HTN (hypertension) Slightly above goal.  Typically well controlled.  Continue current regimen with Coreg 12.5 mg twice daily, losartan 100 mg daily.     Subjective:  HPI:  See A/p.         Objective:  Physical Exam: BP (!) 153/77   Pulse 73   Temp 98 F (36.7 C) (Temporal)   Ht 5\' 4"  (1.626 m)   Wt 245 lb 12.8 oz (111.5 kg)   SpO2 97%   BMI 42.19 kg/m   Gen: No acute distress, resting comfortably CV: Regular rate and rhythm with no murmurs appreciated Pulm: Normal work of breathing, clear to auscultation bilaterally with no crackles, wheezes, or rhonchi Neuro: Grossly normal, moves all extremities Psych: Normal affect and thought content      Thresea Doble M. , MD 01/15/2021 4:21 PM

## 2021-01-15 NOTE — Patient Instructions (Signed)
It was very nice to see you today!  We will check blood work and a urine sample.  I will refill your meds.  Please come back in 3 months to follow-up with [facility].  Come back sooner if needed.  Take care, Dr Jimmey Ralph  PLEASE NOTE:  If you had any lab tests please let us know if you have not heard back within a few days. You may see your results on mychart before we have a chance to review them but we will give you a call once they are reviewed by [place]. If we ordered any referrals today, please let us know if you have not heard from their office within the next week.   Please try these tips to maintain a healthy lifestyle:   Eat at least 3 REAL meals and 1-2 snacks per day.  Aim for no more than 5 hours between eating.  If you eat breakfast, please do so within one hour of getting up.    Each meal should contain half fruits/vegetables, one quarter protein, and one quarter carbs (no bigger than a computer mouse)   Cut down on sweet beverages. This includes juice, soda, and sweet tea.     Drink at least 1 glass of water with each meal and aim for at least 8 glasses per day   Exercise at least 150 minutes every week.

## 2021-01-15 NOTE — Assessment & Plan Note (Signed)
Place referral to orthopedics.

## 2021-01-15 NOTE — Assessment & Plan Note (Signed)
Follows with urology.  She has recently started on Gemesa.  We will check urine culture.  She has been on Cipro for recent UTI diagnosis.

## 2021-01-15 NOTE — Assessment & Plan Note (Addendum)
Slightly above goal.  Typically well controlled.  Continue current regimen with Coreg 12.5 mg twice daily, losartan 100 mg daily.  Continue home monitoring.

## 2021-01-16 LAB — CBC
HCT: 34.5 % — ABNORMAL LOW (ref 36.0–46.0)
Hemoglobin: 11.6 g/dL — ABNORMAL LOW (ref 12.0–15.0)
MCHC: 33.7 g/dL (ref 30.0–36.0)
MCV: 87.7 fl (ref 78.0–100.0)
Platelets: 212 10*3/uL (ref 150.0–400.0)
RBC: 3.93 Mil/uL (ref 3.87–5.11)
RDW: 15 % (ref 11.5–15.5)
WBC: 5.7 10*3/uL (ref 4.0–10.5)

## 2021-01-16 LAB — COMPREHENSIVE METABOLIC PANEL
ALT: 14 U/L (ref 0–35)
AST: 18 U/L (ref 0–37)
Albumin: 4.1 g/dL (ref 3.5–5.2)
Alkaline Phosphatase: 99 U/L (ref 39–117)
BUN: 21 mg/dL (ref 6–23)
CO2: 25 mEq/L (ref 19–32)
Calcium: 9.3 mg/dL (ref 8.4–10.5)
Chloride: 107 mEq/L (ref 96–112)
Creatinine, Ser: 1 mg/dL (ref 0.40–1.20)
GFR: 55.87 mL/min — ABNORMAL LOW (ref >60.00)
Glucose, Bld: 89 mg/dL (ref 70–99)
Potassium: 4.6 mEq/L (ref 3.5–5.1)
Sodium: 141 mEq/L (ref 135–145)
Total Bilirubin: 1.2 mg/dL (ref 0.2–1.2)
Total Protein: 6.8 g/dL (ref 6.0–8.3)

## 2021-01-16 LAB — TSH: TSH: 4.12 u[IU]/mL (ref 0.35–4.50)

## 2021-01-18 LAB — URINE CULTURE
MICRO NUMBER:: 11933777
SPECIMEN QUALITY:: ADEQUATE

## 2021-01-18 NOTE — Progress Notes (Signed)
Please inform patient of the following:  Her urine culture was contaminated. She can come back for another sample if she is still having symptoms. Everything else is stable.  Tara Collier. Jimmey Ralph, MD 01/18/2021 2:58 PM

## 2021-01-30 ENCOUNTER — Ambulatory Visit: Payer: Medicare HMO

## 2021-01-30 ENCOUNTER — Ambulatory Visit: Payer: Self-pay

## 2021-01-30 ENCOUNTER — Other Ambulatory Visit: Payer: Self-pay

## 2021-01-30 ENCOUNTER — Ambulatory Visit (INDEPENDENT_AMBULATORY_CARE_PROVIDER_SITE_OTHER): Payer: Medicare HMO | Admitting: Orthopedic Surgery

## 2021-01-30 DIAGNOSIS — M170 Bilateral primary osteoarthritis of knee: Secondary | ICD-10-CM | POA: Diagnosis not present

## 2021-01-30 DIAGNOSIS — M25511 Pain in right shoulder: Secondary | ICD-10-CM

## 2021-01-30 DIAGNOSIS — M25552 Pain in left hip: Secondary | ICD-10-CM | POA: Diagnosis not present

## 2021-01-30 DIAGNOSIS — M25562 Pain in left knee: Secondary | ICD-10-CM

## 2021-01-30 DIAGNOSIS — M25561 Pain in right knee: Secondary | ICD-10-CM

## 2021-01-30 DIAGNOSIS — G8929 Other chronic pain: Secondary | ICD-10-CM | POA: Diagnosis not present

## 2021-02-02 ENCOUNTER — Other Ambulatory Visit: Payer: Self-pay | Admitting: Internal Medicine

## 2021-02-02 ENCOUNTER — Encounter: Payer: Self-pay | Admitting: Orthopedic Surgery

## 2021-02-02 MED ORDER — METHYLPREDNISOLONE ACETATE 40 MG/ML IJ SUSP
40.0000 mg | INTRAMUSCULAR | Status: AC | PRN
  Administered 2021-02-02: 40 mg via INTRA_ARTICULAR

## 2021-02-02 MED ORDER — BUPIVACAINE HCL 0.25 % IJ SOLN
4.0000 mL | INTRAMUSCULAR | Status: AC | PRN
  Administered 2021-02-02: 4 mL via INTRA_ARTICULAR

## 2021-02-02 MED ORDER — METHYLPREDNISOLONE ACETATE 40 MG/ML IJ SUSP
40.0000 mg | INTRAMUSCULAR | Status: AC | PRN
Start: 2021-02-02 — End: 2021-02-02
  Administered 2021-02-02: 40 mg via INTRA_ARTICULAR

## 2021-02-02 MED ORDER — LIDOCAINE HCL 1 % IJ SOLN
5.0000 mL | INTRAMUSCULAR | Status: AC | PRN
  Administered 2021-02-02: 5 mL

## 2021-02-02 NOTE — Progress Notes (Signed)
Office Visit Note   Patient: Tara Collier           Date of Birth: 02/09/1946           MRN: [id number] Visit Date: 01/30/2021 Requested by: Ardith Dark, MD [address] [place],  [place] [address] PCP: Jarold Motto, PA  Subjective: Chief Complaint  Patient presents with   Left Knee - Pain   Right Knee - Pain    HPI: Tara Collier is a 75 y.o. female who presents to the office complaining of bilateral knee pain.  Patient reports chronic bilateral knee pain that is worse in her left knee.  She has had 3-4 falls over the last couple years.  Most recent fall was about a month ago.  She has had persistent increased pain since that fall.  Complains of occasional swelling giving way of the knees, locking.  She has had previous injections in both knees with gel that have provided good relief.  She also uses blue emu with some relief of her pain.  She has had no history of surgery on both knees.  She has a cardiologist and currently takes Eliquis for atrial fibrillation.  Denies any history of diabetes.  She has difficulty with stairs due to pain.  She also complains of right shoulder pain.  She has a history of rotator cuff repair on the right side.  Localizes most of her pain to the lateral aspect of the shoulder with radiation to the elbow but no radiation down the entirety of the arm.  She is able to lift the arm up under its own power without much difficulty.  Denies any recent injury to the right shoulder..                ROS: All systems reviewed are negative as they relate to the chief complaint within the history of present illness.  Patient denies fevers or chills.  Assessment & Plan: Visit Diagnoses:  1. Chronic pain of both knees   2. Pain in left hip   3. Right shoulder pain, unspecified chronicity     Plan: Patient is a 75 year old female who presents complaint of bilateral knee pain.  She has had knee pain for years.  Couple falls with most recent fall 1 month ago.   Increased pain since then.  Radiographs reveal bilateral knee osteoarthritis, worst in the left knee which is her more symptomatic knee.  She would like to try gel injections.  Plan to preapproved patient for these gel injections and also administer bilateral knee cortisone injections today for symptomatic relief.  Patient tolerated both injections well and plan to follow-up for gel injections after approval.  Also, plan for more thorough evaluation of the right shoulder at that time.  Right shoulder radiographs were taken today which revealed no significant acute or degenerative changes of the right shoulder joint.  Follow-Up Instructions: No follow-ups on file.   Orders:  Orders Placed This Encounter  Procedures   XR KNEE 3 VIEW LEFT   XR Knee 1-2 Views Right   XR Shoulder Right   XR HIP UNILAT W OR W/O PELVIS 2-3 VIEWS LEFT   No orders of the defined types were placed in this encounter.     Procedures: Large Joint Inj: bilateral knee on 02/02/2021 12:02 PM Indications: diagnostic evaluation, joint swelling and pain Details: 18 G 1.5 in needle, superolateral approach  Arthrogram: No  Medications (Right): 5 mL lidocaine 1 %; 4 mL bupivacaine 0.25 %; 40  mg methylPREDNISolone acetate 40 MG/ML Medications (Left): 5 mL lidocaine 1 %; 4 mL bupivacaine 0.25 %; 40 mg methylPREDNISolone acetate 40 MG/ML Outcome: tolerated well, no immediate complications Procedure, treatment alternatives, risks and benefits explained, specific risks discussed. Consent was given by the patient. Immediately prior to procedure a time out was called to verify the correct patient, procedure, equipment, support staff and site/side marked as required. Patient was prepped and draped in the usual sterile fashion.      Clinical Data: No additional findings.  Objective: Vital Signs: There were no vitals taken for this visit.  Physical Exam:  Constitutional: Patient appears well-developed HEENT:  Head:  Normocephalic Eyes:EOM are normal Neck: Normal range of motion Cardiovascular: Normal rate Pulmonary/chest: Effort normal Neurologic: Patient is alert Skin: Skin is warm Psychiatric: Patient has normal mood and affect  Ortho Exam: Ortho exam demonstrates right knee with 0 degrees extension and 95 degrees of knee flexion.  Effusion is present.  Left knee with 10 degrees extension and 95 degrees of knee flexion.  Effusion is present in the left knee as well.  Groin pain elicited with internal rotation of the hip.  Positive Stinchfield sign.  Able to perform straight leg raise with both legs without difficulty.  No calf tenderness bilaterally.  Negative Homans' sign bilaterally.  Tenderness over the medial and lateral joint lines bilaterally.  Specialty Comments:  No specialty comments available.  Imaging: No results found.   PMFS History: Patient Active Problem List   Diagnosis Date Noted   Secondary hypercoagulable state (HCC) 10/15/2020   Urinary incontinence 08/08/2020   Arthritis    Hypercholesterolemia    Restless leg syndrome    Atrial fibrillation (HCC) 06/03/2013   HTN (hypertension) 06/03/2013   Past Medical History:  Diagnosis Date   Arthritis    Atrial fibrillation (HCC) 06/03/2013   Last Assessment & Plan:  Formatting of this note might be different from the original. Continue propafenone for rhythm control .Patient has clear symptomatic benefit with sinus rhythm maintenance. CHADS-Vasc score over 2, continue anticoagulation with eliquis.   HTN (hypertension) 06/03/2013   Last Assessment & Plan:  Formatting of this note might be different from the original. Hypertension is well controlled.  Continue current medical regimen and low sodium diet.  Reevaluate at next office visit   Hypercholesterolemia    Restless leg syndrome    Urinary incontinence 08/08/2020    No family history on file.  Past Surgical History:  Procedure Laterality Date   CARDIOVERSION      RADIOFREQUENCY ABLATION     ROTATOR CUFF REPAIR     TUBAL LIGATION     Social History   Occupational History   Not on file  Tobacco Use   Smoking status: Never   Smokeless tobacco: Never  Substance and Sexual Activity   Alcohol use: No   Drug use: No   Sexual activity: Not on file

## 2021-02-07 ENCOUNTER — Telehealth: Payer: Self-pay | Admitting: Internal Medicine

## 2021-02-07 MED ORDER — CARVEDILOL 12.5 MG PO TABS: 12.5000 mg | ORAL_TABLET | Freq: Two times a day (BID) | ORAL | 3 refills | Status: DC

## 2021-02-07 NOTE — Telephone Encounter (Signed)
Spoke to patient's daughter Carvedilol 90 day refill sent to pharmacy.

## 2021-02-07 NOTE — Telephone Encounter (Signed)
*  STAT* If patient is at the pharmacy, call can be transferred to refill team.   1. Which medications need to be refilled? (please list name of each medication and dose if known) carvedilol (COREG) 12.5 MG tablet  2. Which pharmacy/location (including street and city if local pharmacy) is medication to be sent to?[id number] - [place], [place] - [address]  3. Do they need a 30 day or 90 day supply? 30 day supply   PT pharmacy is requesting the doctor approve for them to refill the medication

## 2021-02-08 ENCOUNTER — Telehealth: Payer: Self-pay | Admitting: Internal Medicine

## 2021-02-08 NOTE — Telephone Encounter (Signed)
Patient can ask pharmacy to fill only 30 day supply, we don't need to send new rx.  Offer patient assistance paperwork from manufacturer for cost.  Afraid I don't have any ways to make it cheaper.

## 2021-02-08 NOTE — Telephone Encounter (Signed)
Pt c/o medication issue:  1. Name of Medication: apixaban (ELIQUIS) 5 MG TABS tablet  2. How are you currently taking this medication (dosage and times per day)? 1 tablet by mouth 2 times daily  3. Are you having a reaction (difficulty breathing--STAT)? no  4. What is your medication issue? Patient called in asking if she can get apixaban (ELIQUIS) 5 MG TABS tablet in the 30 day supple instead of 90 days because with 90 its $500. And when its 30 days it $122. Patient was like to know if we have any discount when it comes to medication.

## 2021-02-08 NOTE — Telephone Encounter (Signed)
Left message for patient to ask for 30 tablets only. Patient assistance application mailed to the patient.

## 2021-02-14 NOTE — Telephone Encounter (Signed)
Attempted to call patient. Left message for patient to call back to office if there were any issues with Eliquis.

## 2021-02-18 NOTE — Progress Notes (Signed)
Cardiology Office Note:    Date:  02/19/2021   ID:  Tara Collier, DOB 20-Mar-1946, MRN [id number]  PCP:  Jarold Motto, PA  Cardiologist:  Parke Poisson, MD  Electrophysiologist:  None   Referring MD: Jarold Motto, PA   Chief Complaint/Reason for Referral: HTN, HLD, afib  History of Present Illness:    Tara Collier is a 74 y.o. female with a history of HTN, HLD, afib. Presents for further evaluation of afib. Presents with daughter who provides some of the history.   Today, she is accompanied by her daughter, who also provides some history. Lately she is feeling more palpitations and shortness of breath. About two weeks ago she felt her heart pounding, and it was painful. She believes she is feeling them more frequently because of being more aware of them since her last appointment. We discussed participating in the Afib clinic to begin propafenone. Previously, she was taking propafenone in 2014.  We discussed division of expertise and the specialty of the A. fib clinic to coordinate care for patients with persistent atrial fibrillation particularly with left atrial dilation and management of antiarrhythmic agents.  I feel it is in her best interest to be managed by the A. fib clinic when it comes to antiarrhythmics and further options such as ablation.  They are frustrated by this, but I feel it is in the patient's best interest for drug monitoring and drug options.  Her nuclear stress test was nonischemic.  A month ago, she stumbled and fell in her yard. Her daughter reports she has issues ambulating her knees and likely lost her balance. She asks about a referral for physical therapy, referral can come from PCP or orthopedic physician as the indication is for mechanical falls and gait instability.    At home her blood pressure has typically been elevated. She was on amlodipine until her prescription ran out and her insurance no longer covered it. They deny any side effects or  complication.  We will resume amlodipine 5 mg daily.  For activity, she is not regularly participating in formal exercise. For her diet, she tries to incorporate more fruits and vegetables. She has had some success with cutting back on fast food, but still partakes occasionally.  I have recommended a moderate exercise program, she can begin with walking daily and monitoring her symptoms.  Since her last appointment she has been compliant with her medications. She denies missing any doses of Eliquis in the past month. For a refill of Eliquis they will bring patient assistance forms for my signature.  She denies any chest pain, or exertional symptoms. No headaches, lightheadedness, or syncope to report. Also has no lower extremity edema, orthopnea or PND.  Past Medical History:  Diagnosis Date   Arthritis    Atrial fibrillation (HCC) 06/03/2013   Last Assessment & Plan:  Formatting of this note might be different from the original. Continue propafenone for rhythm control .Patient has clear symptomatic benefit with sinus rhythm maintenance. CHADS-Vasc score over 2, continue anticoagulation with eliquis.   HTN (hypertension) 06/03/2013   Last Assessment & Plan:  Formatting of this note might be different from the original. Hypertension is well controlled.  Continue current medical regimen and low sodium diet.  Reevaluate at next office visit   Hypercholesterolemia    Restless leg syndrome    Urinary incontinence 08/08/2020    Past Surgical History:  Procedure Laterality Date   CARDIOVERSION     RADIOFREQUENCY ABLATION     ROTATOR  CUFF REPAIR     TUBAL LIGATION      Current Medications: Current Meds  Medication Sig   amLODipine (NORVASC) 5 MG tablet Take 1 tablet (5 mg total) by mouth daily.   apixaban (ELIQUIS) 5 MG TABS tablet Take 1 tablet (5 mg total) by mouth 2 (two) times daily.   atorvastatin (LIPITOR) 40 MG tablet Take 1 tablet (40 mg total) by mouth daily.   carvedilol (COREG)  12.5 MG tablet Take 1 tablet (12.5 mg total) by mouth 2 (two) times daily with a meal.   gabapentin (NEURONTIN) 100 MG capsule Take 1 capsule (100 mg total) by mouth at bedtime.   losartan (COZAAR) 100 MG tablet Take 1 tablet (100 mg total) by mouth daily.   meclizine (ANTIVERT) 25 MG tablet Take 1 tablet (25 mg total) by mouth 3 (three) times daily as needed for dizziness.   polyethylene glycol powder (GLYCOLAX/MIRALAX) 17 GM/SCOOP powder Take 1 g by mouth daily as needed.   Vibegron (GEMTESA) 75 MG TABS Take by mouth.     Allergies:   Tape and Bee venom   Social History   Tobacco Use   Smoking status: Never   Smokeless tobacco: Never  Substance Use Topics   Alcohol use: No   Drug use: No     Family History: The patient's family history is not on file.  ROS:   Please see the history of present illness.    (+) Palpitations, painful (+) Shortness of breath (+) Fall All other systems reviewed and are negative.  EKGs/Labs/Other Studies Reviewed:    The following studies were reviewed today:  Lexiscan Stress Myoview 10/30/2020: Nuclear stress EF: 63%. There was no ST segment deviation noted during stress. The study is normal. This is a low risk study. The left ventricular ejection fraction is normal (55-65%).   Normal stress nuclear study with no ischemia or infarction.  Gated ejection fraction 63% with normal wall motion.  Cardiac Telemetry Monitoring 10/23/2020: Patient wore monitor for 7 hours, for which she was in Afib for at least 94% of time. I do not clearly see sinus rhythm on monitor strips. Rate controlled atrial fibrillation.  Echo 10/05/2020: 1. Left ventricular ejection fraction, by estimation, is 60 to 65%. The  left ventricle has normal function. The left ventricle has no regional  wall motion abnormalities. Left ventricular diastolic function could not  be evaluated.   2. Right ventricular systolic function is normal. The right ventricular  size is mildly  enlarged. There is mildly elevated pulmonary artery  systolic pressure. The estimated right ventricular systolic pressure is  44.0 mmHg.   3. Right atrial size was moderately dilated.   4. The mitral valve is normal in structure. No evidence of mitral valve  regurgitation. No evidence of mitral stenosis.   5. The aortic valve is tricuspid. Aortic valve regurgitation is not  visualized. Mild aortic valve sclerosis is present, with no evidence of  aortic valve stenosis.   6. The inferior vena cava is normal in size with greater than 50%  respiratory variability, suggesting right atrial pressure of 3 mmHg.  EKG:   02/19/2021: Afib, rate 69 bpm, septal infarct pattern. 09/13/2020: Afib, septal infarct pattern.   Recent Labs: 01/15/2021: ALT 14; BUN 21; Creatinine, Ser 1.00; Hemoglobin 11.6; Platelets 212.0; Potassium 4.6; Sodium 141; TSH 4.12  Recent Lipid Panel    Component Value Date/Time   CHOL 236 (H) 08/08/2020 0935   TRIG 120 08/08/2020 0935   HDL 45 (L)  08/08/2020 0935   CHOLHDL 5.2 (H) 08/08/2020 0935   LDLCALC 166 (H) 08/08/2020 0935    Physical Exam:    VS:  BP (!) 152/82 (BP Location: Left Arm, Patient Position: Sitting, Cuff Size: Large)   Pulse 69   Ht 5\' 4"  (1.626 m)   Wt 254 lb 9.6 oz (115.5 kg)   SpO2 96%   BMI 43.70 kg/m     Wt Readings from Last 5 Encounters:  02/19/21 254 lb 9.6 oz (115.5 kg)  01/15/21 245 lb 12.8 oz (111.5 kg)  11/05/20 250 lb (113.4 kg)  10/29/20 242 lb (109.8 kg)  10/15/20 242 lb 12.8 oz (110.1 kg)    Constitutional: No acute distress Eyes: sclera non-icteric, normal conjunctiva and lids ENMT: normal dentition, moist mucous membranes Cardiovascular: irregular rhythm, normal rate, no murmurs. S1 and S2 normal. Radial pulses normal bilaterally. No jugular venous distention.  Respiratory: clear to auscultation bilaterally GI : normal bowel sounds, soft and nontender. No distention.   MSK: extremities warm, well perfused. Mild bilateral  edema.  NEURO: grossly nonfocal exam, moves all extremities. PSYCH: alert and oriented x 3, normal mood and affect.   ASSESSMENT:    1. Atrial fibrillation, unspecified type [facility](HCC)     PLAN:    Atrial fibrillation, unspecified type (HCC) -The patient has been established with A. fib clinic and A. fib clinic has been recommendations about antiarrhythmic therapy.  I would defer to their judgment on selection of antiarrhythmics and I have recommended that the patient contact A. fib clinic to begin antiarrhythmic therapy if indicated and coordinate cardioversion when appropriate.  She is interested in cardioversion now and has been on Eliquis per her report twice daily for at least the past 30 days. -Continue Eliquis 5 mg twice daily, no bleeding complications.  Dyspnea on exertion -  - suspect this may be related to afib and may also be multifactorial but does not seem related to ischemia.  She was unable to complete the cardiac monitor for more than 7 hours but was in atrial fibrillation for that duration.  She is interested in pursuing antiarrhythmic options and cardioversion for A. fib if she benefits with less dyspnea this would be quite helpful.  If dyspnea persists, would recommend a moderate exercise program to build up her conditioning and seeking out any further recommendations from her primary care doctor.  Hypercholesterolemia - continue atorvastatin 40 mg dialy  Primary hypertension -continue carvedilol and losartan.  Was previously on amlodipine 5 mg daily but discontinued due to lack of insurance coverage.  We will resume amlodipine 5 mg daily today and this can be followed up at her next office visit with either A. fib clinic or her primary care provider.  If she has concerns would recommend cardiology follow-up at that time.   Total time of encounter: 35 minutes total time of encounter, including 25 minutes spent in face-to-face patient care on the date of this encounter. This  time includes coordination of care and counseling regarding above mentioned problem list. Remainder of non-face-to-face time involved reviewing chart documents/testing relevant to the patient encounter and documentation in the medical record. I have independently reviewed documentation from referring provider.   Weston BrassGayatri Alecia Doi, MD [place]  [facility]    Medication Adjustments/Labs and Tests Ordered: Current medicines are reviewed at length with the patient today.  Concerns regarding medicines are outlined above.   Orders Placed This Encounter  Procedures   EKG 12-Lead     Meds ordered this  encounter  Medications   amLODipine (NORVASC) 5 MG tablet    Sig: Take 1 tablet (5 mg total) by mouth daily.    Dispense:  90 tablet    Refill:  3     Patient Instructions  Medication Instructions:  PLEASE START: AMLODIPINE 5mg - DAILY  *If you need a refill on your cardiac medications before your next appointment, please call your pharmacy*  Follow-Up: At [facility], you and your health needs are our priority.  As part of our continuing mission to provide you with exceptional heart care, we have created designated Provider Care Teams.  These Care Teams include your primary Cardiologist (physician) and Advanced Practice Providers (APPs -  Physician Assistants and Nurse Practitioners) who all work together to provide you with the care you need, when you need it.  We recommend signing up for the patient portal called "MyChart".  Sign up information is provided on this After Visit Summary.  MyChart is used to connect with patients for Virtual Visits (Telemedicine).  Patients are able to view lab/test results, encounter notes, upcoming appointments, etc.  Non-urgent messages can be sent to your provider as well.   To learn more about what you can do with MyChart, go to [facility].    Your next appointment:   6 month(s)  The format for your next appointment:   In  Person  Provider:   You will see one of the following Advanced Practice Providers on your designated Care Team:   [email or web address], PA-C Theodore Demark, DNP, APP ANY APP  Other Instructions PLEASE CALL THE ATRIAL FIBRILLATION CLINIC TO SCHEDULE AN APPOINTMENT    I,Mathew Stumpf,acting as a scribe for Joni Reining, MD.,have documented all relevant documentation on the behalf of Parke Poisson, MD,as directed by  Parke Poisson, MD while in the presence of Parke Poisson, MD.  I, Parke Poisson, MD, have reviewed all documentation for this visit. The documentation on 02/19/21 for the exam, diagnosis, procedures, and orders are all accurate and complete.

## 2021-02-19 ENCOUNTER — Ambulatory Visit: Payer: Medicare HMO | Admitting: Internal Medicine

## 2021-02-19 ENCOUNTER — Other Ambulatory Visit: Payer: Self-pay

## 2021-02-19 ENCOUNTER — Encounter: Payer: Self-pay | Admitting: Internal Medicine

## 2021-02-19 VITALS — BP 152/82 | HR 69 | Ht 64.0 in | Wt 254.6 lb

## 2021-02-19 DIAGNOSIS — I4811 Longstanding persistent atrial fibrillation: Secondary | ICD-10-CM

## 2021-02-19 DIAGNOSIS — R600 Localized edema: Secondary | ICD-10-CM

## 2021-02-19 DIAGNOSIS — E7800 Pure hypercholesterolemia, unspecified: Secondary | ICD-10-CM | POA: Diagnosis not present

## 2021-02-19 DIAGNOSIS — I10 Essential (primary) hypertension: Secondary | ICD-10-CM | POA: Diagnosis not present

## 2021-02-19 DIAGNOSIS — D6869 Other thrombophilia: Secondary | ICD-10-CM | POA: Diagnosis not present

## 2021-02-19 DIAGNOSIS — R0609 Other forms of dyspnea: Secondary | ICD-10-CM

## 2021-02-19 DIAGNOSIS — R0600 Dyspnea, unspecified: Secondary | ICD-10-CM

## 2021-02-19 MED ORDER — AMLODIPINE BESYLATE 5 MG PO TABS: 5.0000 mg | ORAL_TABLET | Freq: Every day | ORAL | 3 refills | Status: DC

## 2021-02-19 NOTE — Patient Instructions (Signed)
Medication Instructions:  PLEASE START: AMLODIPINE 5mg - DAILY  *If you need a refill on your cardiac medications before your next appointment, please call your pharmacy*  Follow-Up: At [facility], you and your health needs are our priority.  As part of our continuing mission to provide you with exceptional heart care, we have created designated Provider Care Teams.  These Care Teams include your primary Cardiologist (physician) and Advanced Practice Providers (APPs -  Physician Assistants and Nurse Practitioners) who all work together to provide you with the care you need, when you need it.  We recommend signing up for the patient portal called "MyChart".  Sign up information is provided on this After Visit Summary.  MyChart is used to connect with patients for Virtual Visits (Telemedicine).  Patients are able to view lab/test results, encounter notes, upcoming appointments, etc.  Non-urgent messages can be sent to your provider as well.   To learn more about what you can do with MyChart, go to [facility].    Your next appointment:   6 month(s)  The format for your next appointment:   In Person  Provider:   You will see one of the following Advanced Practice Providers on your designated Care Team:   [email or web address], PA-C Theodore Demark, DNP, APP ANY APP  Other Instructions PLEASE CALL THE ATRIAL FIBRILLATION CLINIC TO SCHEDULE AN APPOINTMENT

## 2021-02-21 NOTE — Progress Notes (Signed)
Primary Care Physician: Jarold Motto, [place] Primary Cardiologist: Dr Jacques Navy  Primary Electrophysiologist: none Referring Physician: Dr Avel Sensor is a 75 y.o. female with a history of HTN, HLD, atrial fibrillation, atrial flutter who presents for follow up in the [facility]. The patient was initially diagnosed with atrial fibrillation and atrial flutter remotely and underwent flutter ablation in 2013. She has previously been on propafenone but she was lost to cardiology follow up and discontinued this on her own. Patient is on Eliquis for a CHADS2VASC score of 3. She reports dyspnea with exertion for the last 2 years. She is unsure how long she has been in afib.   On follow up today, patient reports that she has intermittent symptoms of fatigue and dyspnea with exertion. She can feel "great" one day and then poorly the next. She remains in rate controlled afib. She denies any bleeding issues on anticoagulation.   Today, she denies symptoms of palpitations, chest pain, orthopnea, PND, lower extremity edema, dizziness, presyncope, syncope, bleeding, or neurologic sequela. The patient is tolerating medications without difficulties and is otherwise without complaint today.    Atrial Fibrillation Risk Factors:  she does have symptoms or diagnosis of sleep apnea. She did not follow up for sleep consult.  she does not have a history of rheumatic fever. she does not have a history of alcohol use. The patient does not have a history of early familial atrial fibrillation or other arrhythmias.  she has a BMI of Body mass index is 43.08 kg/m.Marland Kitchen Filed Weights   02/22/21 0903  Weight: 113.9 kg     No family history on file.   Atrial Fibrillation Management history:  Previous antiarrhythmic drugs: propafenone  Previous cardioversions: 2013 Previous ablations: 07/19/12 (flutter) CHADS2VASC score: 3 Anticoagulation history: Eliquis   Past  Medical History:  Diagnosis Date   Arthritis    Atrial fibrillation (HCC) 06/03/2013   Last Assessment & Plan:  Formatting of this note might be different from the original. Continue propafenone for rhythm control .Patient has clear symptomatic benefit with sinus rhythm maintenance. CHADS-Vasc score over 2, continue anticoagulation with eliquis.   HTN (hypertension) 06/03/2013   Last Assessment & Plan:  Formatting of this note might be different from the original. Hypertension is well controlled.  Continue current medical regimen and low sodium diet.  Reevaluate at next office visit   Hypercholesterolemia    Restless leg syndrome    Urinary incontinence 08/08/2020   Past Surgical History:  Procedure Laterality Date   CARDIOVERSION     RADIOFREQUENCY ABLATION     ROTATOR CUFF REPAIR     TUBAL LIGATION      Current Outpatient Medications  Medication Sig Dispense Refill   acetaminophen (TYLENOL) 500 MG tablet Take 500 mg by mouth every 6 (six) hours as needed for mild pain or moderate pain.     amLODipine (NORVASC) 5 MG tablet Take 1 tablet (5 mg total) by mouth daily. 90 tablet 3   apixaban (ELIQUIS) 5 MG TABS tablet Take 1 tablet (5 mg total) by mouth 2 (two) times daily. 180 tablet 3   atorvastatin (LIPITOR) 40 MG tablet Take 1 tablet (40 mg total) by mouth daily. 90 tablet 3   carvedilol (COREG) 12.5 MG tablet Take 1 tablet (12.5 mg total) by mouth 2 (two) times daily with a meal. 180 tablet 3   diclofenac Sodium (VOLTAREN) 1 % GEL Apply 2 g topically 4 (four) times daily.  gabapentin (NEURONTIN) 100 MG capsule Take 1 capsule (100 mg total) by mouth at bedtime. 90 capsule 3   losartan (COZAAR) 100 MG tablet Take 1 tablet (100 mg total) by mouth daily. 90 tablet 3   meclizine (ANTIVERT) 25 MG tablet Take 1 tablet (25 mg total) by mouth 3 (three) times daily as needed for dizziness. 30 tablet 3   polyethylene glycol powder (GLYCOLAX/MIRALAX) 17 GM/SCOOP powder Take 1 g by mouth daily  as needed.     Vibegron (GEMTESA) 75 MG TABS Take by mouth.     No current facility-administered medications for this encounter.    Allergies  Allergen Reactions   Tape Rash   Bee Venom Rash    Social History   Socioeconomic History   Marital status: Single    Spouse name: Not on file   Number of children: Not on file   Years of education: Not on file   Highest education level: Not on file  Occupational History   Not on file  Tobacco Use   Smoking status: Never   Smokeless tobacco: Never  Substance and Sexual Activity   Alcohol use: No   Drug use: No   Sexual activity: Not on file  Other Topics Concern   Not on file  Social History Narrative   Daughter in [place]   Lives by herself   Worked for [employer] for 20+ years   Social Determinants of Health   Financial Resource Strain: Not on file  Food Insecurity: Not on file  Transportation Needs: Not on file  Physical Activity: Not on file  Stress: Not on file  Social Connections: Not on file  Intimate Partner Violence: Not on file     ROS- All systems are reviewed and negative except as per the HPI above.  Physical Exam: Vitals:   02/22/21 0903  BP: 138/88  Pulse: 67  Weight: 113.9 kg  Height: 5\' 4"  (1.626 m)    GEN- The patient is a well appearing obese female, alert and oriented x 3 today.   HEENT-head normocephalic, atraumatic, sclera clear, conjunctiva pink, hearing intact, trachea midline. Lungs- Clear to ausculation bilaterally, normal work of breathing Heart- irregular rate and rhythm, no murmurs, rubs or gallops  GI- soft, NT, ND, + BS Extremities- no clubbing, cyanosis, or edema MS- no significant deformity or atrophy Skin- no rash or lesion Psych- euthymic mood, full affect Neuro- strength and sensation are intact   Wt Readings from Last 3 Encounters:  02/22/21 113.9 kg  02/19/21 115.5 kg  01/15/21 111.5 kg    EKG today demonstrates  Afib Vent. rate 67 BPM PR interval * ms QRS  duration 84 ms QT/QTcB 410/433 ms  Echo 10/05/20 demonstrated  1. Left ventricular ejection fraction, by estimation, is 60 to 65%. The  left ventricle has normal function. The left ventricle has no regional  wall motion abnormalities. Left ventricular diastolic function could not  be evaluated.   2. Right ventricular systolic function is normal. The right ventricular  size is mildly enlarged. There is mildly elevated pulmonary artery  systolic pressure. The estimated right ventricular systolic pressure is  44.0 mmHg.   3. Right atrial size was moderately dilated.   4. The mitral valve is normal in structure. No evidence of mitral valve  regurgitation. No evidence of mitral stenosis.   5. The aortic valve is tricuspid. Aortic valve regurgitation is not  visualized. Mild aortic valve sclerosis is present, with no evidence of  aortic valve stenosis.  6. The inferior vena cava is normal in size with greater than 50%  respiratory variability, suggesting right atrial pressure of 3 mmHg.   LA 5.8 cm  Epic records are reviewed at length today  CHA2DS2-VASc Score = 3  The patient's score is based upon: CHF History: No HTN History: Yes Diabetes History: No Stroke History: No Vascular Disease History: No Age Score: 1 Gender Score: 1      ASSESSMENT AND PLAN: 1. Longstanding Persistent Atrial Fibrillation/typical atrial flutter The patient's CHA2DS2-VASc score is 3, indicating a 3.2% annual risk of stroke.   We discussed therapeutic options today including AAD (propafenone, dofetilide, amio) + DCCV vs rate control.  After discussing the risks and benefits of each option, she would like to work on lifestyle modification for now and see if her symptoms improve. Will refer to [facility] exercise program. Patient agrees to f/u with sleep medicine for OSA evaluation.  Continue Eliquis 5 mg BID Continue Coreg 12.5 mg BID  2. Secondary Hypercoagulable State (ICD10:  D68.69) The patient is at  significant risk for stroke/thromboembolism based upon her CHA2DS2-VASc Score of 3.  Continue Apixaban (Eliquis).   3. Obesity Body mass index is 43.08 kg/m. Lifestyle modification was discussed and encouraged including regular physical activity and weight reduction. Referred to [facility] exercise program.  4. Suspected OSA The importance of adequate treatment of sleep apnea was discussed today in order to improve our ability to maintain sinus rhythm long term. She will reach out to sleep medicine to reschedule consult.   5. HTN Stable, no changes today.   Follow up in the AF clinic in 3 months, sooner if she changes her mind about AAD.    Jorja Loa PA-C [facility] [address] [place], [place] [address] [phone] 02/22/2021 1:07 PM

## 2021-02-22 ENCOUNTER — Other Ambulatory Visit: Payer: Self-pay

## 2021-02-22 ENCOUNTER — Ambulatory Visit (HOSPITAL_COMMUNITY)
Admission: RE | Admit: 2021-02-22 | Discharge: 2021-02-22 | Disposition: A | Discharge status: Home / Self Care | Payer: Medicare HMO | Source: Ambulatory Visit | Attending: Physician Assistant | Admitting: Physician Assistant

## 2021-02-22 ENCOUNTER — Encounter (HOSPITAL_COMMUNITY): Payer: Self-pay | Admitting: Physician Assistant

## 2021-02-22 VITALS — BP 138/88 | HR 67 | Ht 64.0 in | Wt 251.0 lb

## 2021-02-22 DIAGNOSIS — Z888 Allergy status to other drugs, medicaments and biological substances status: Secondary | ICD-10-CM | POA: Insufficient documentation

## 2021-02-22 DIAGNOSIS — Z79899 Other long term (current) drug therapy: Secondary | ICD-10-CM | POA: Insufficient documentation

## 2021-02-22 DIAGNOSIS — E669 Obesity, unspecified: Secondary | ICD-10-CM | POA: Diagnosis not present

## 2021-02-22 DIAGNOSIS — I4811 Longstanding persistent atrial fibrillation: Secondary | ICD-10-CM | POA: Diagnosis not present

## 2021-02-22 DIAGNOSIS — I10 Essential (primary) hypertension: Secondary | ICD-10-CM | POA: Diagnosis not present

## 2021-02-22 DIAGNOSIS — Z6841 Body Mass Index (BMI) 40.0 and over, adult: Secondary | ICD-10-CM | POA: Insufficient documentation

## 2021-02-22 DIAGNOSIS — I483 Typical atrial flutter: Secondary | ICD-10-CM | POA: Insufficient documentation

## 2021-02-22 DIAGNOSIS — D6869 Other thrombophilia: Secondary | ICD-10-CM

## 2021-02-22 DIAGNOSIS — R5383 Other fatigue: Secondary | ICD-10-CM | POA: Insufficient documentation

## 2021-02-22 DIAGNOSIS — G4733 Obstructive sleep apnea (adult) (pediatric): Secondary | ICD-10-CM | POA: Diagnosis not present

## 2021-02-22 DIAGNOSIS — Z91030 Bee allergy status: Secondary | ICD-10-CM | POA: Diagnosis not present

## 2021-02-22 DIAGNOSIS — Z713 Dietary counseling and surveillance: Secondary | ICD-10-CM | POA: Insufficient documentation

## 2021-02-22 DIAGNOSIS — Z7901 Long term (current) use of anticoagulants: Secondary | ICD-10-CM | POA: Diagnosis not present

## 2021-02-22 DIAGNOSIS — R0600 Dyspnea, unspecified: Secondary | ICD-10-CM | POA: Insufficient documentation

## 2021-02-28 ENCOUNTER — Telehealth: Payer: Self-pay

## 2021-02-28 NOTE — Telephone Encounter (Signed)
Called left voicemail re: PREP program.

## 2021-04-01 ENCOUNTER — Telehealth: Payer: Self-pay

## 2021-04-01 NOTE — Telephone Encounter (Signed)
Called to discuss availability/interest in attending next PREP program at [facility], left voicemail.

## 2021-04-09 ENCOUNTER — Ambulatory Visit: Payer: Medicare HMO | Admitting: Family Medicine

## 2021-04-12 ENCOUNTER — Ambulatory Visit (INDEPENDENT_AMBULATORY_CARE_PROVIDER_SITE_OTHER): Payer: Medicare HMO | Admitting: Physician Assistant

## 2021-04-12 ENCOUNTER — Ambulatory Visit (INDEPENDENT_AMBULATORY_CARE_PROVIDER_SITE_OTHER)
Admission: RE | Admit: 2021-04-12 | Discharge: 2021-04-12 | Disposition: A | Discharge status: Home / Self Care | Payer: Medicare HMO | Source: Ambulatory Visit | Attending: Physician Assistant | Admitting: Physician Assistant

## 2021-04-12 ENCOUNTER — Other Ambulatory Visit: Payer: Self-pay

## 2021-04-12 ENCOUNTER — Encounter: Payer: Self-pay | Admitting: Physician Assistant

## 2021-04-12 VITALS — BP 144/84 | HR 67 | Temp 98.2°F | Ht 64.0 in | Wt 255.0 lb

## 2021-04-12 DIAGNOSIS — I10 Essential (primary) hypertension: Secondary | ICD-10-CM | POA: Diagnosis not present

## 2021-04-12 DIAGNOSIS — R0602 Shortness of breath: Secondary | ICD-10-CM | POA: Diagnosis not present

## 2021-04-12 DIAGNOSIS — I4891 Unspecified atrial fibrillation: Secondary | ICD-10-CM | POA: Diagnosis not present

## 2021-04-12 DIAGNOSIS — M7989 Other specified soft tissue disorders: Secondary | ICD-10-CM | POA: Diagnosis not present

## 2021-04-12 DIAGNOSIS — R600 Localized edema: Secondary | ICD-10-CM | POA: Diagnosis not present

## 2021-04-12 DIAGNOSIS — I517 Cardiomegaly: Secondary | ICD-10-CM | POA: Diagnosis not present

## 2021-04-12 DIAGNOSIS — J9811 Atelectasis: Secondary | ICD-10-CM | POA: Diagnosis not present

## 2021-04-12 LAB — COMPREHENSIVE METABOLIC PANEL
ALT: 10 U/L (ref 0–35)
AST: 15 U/L (ref 0–37)
Albumin: 3.9 g/dL (ref 3.5–5.2)
Alkaline Phosphatase: 103 U/L (ref 39–117)
BUN: 21 mg/dL (ref 6–23)
CO2: 26 mEq/L (ref 19–32)
Calcium: 8.9 mg/dL (ref 8.4–10.5)
Chloride: 107 mEq/L (ref 96–112)
Creatinine, Ser: 1.14 mg/dL (ref 0.40–1.20)
GFR: 47.66 mL/min — ABNORMAL LOW (ref >60.00)
Glucose, Bld: 89 mg/dL (ref 70–99)
Potassium: 4.6 mEq/L (ref 3.5–5.1)
Sodium: 141 mEq/L (ref 135–145)
Total Bilirubin: 1.4 mg/dL — ABNORMAL HIGH (ref 0.2–1.2)
Total Protein: 6.5 g/dL (ref 6.0–8.3)

## 2021-04-12 LAB — CBC WITH DIFFERENTIAL/PLATELET
Basophils Absolute: 0 10*3/uL (ref 0.0–0.1)
Basophils Relative: 0.7 % (ref 0.0–3.0)
Eosinophils Absolute: 0.2 10*3/uL (ref 0.0–0.7)
Eosinophils Relative: 3.2 % (ref 0.0–5.0)
HCT: 29.7 % — ABNORMAL LOW (ref 36.0–46.0)
Hemoglobin: 9.9 g/dL — ABNORMAL LOW (ref 12.0–15.0)
Lymphocytes Relative: 17.6 % (ref 12.0–46.0)
Lymphs Abs: 1.1 10*3/uL (ref 0.7–4.0)
MCHC: 33.3 g/dL (ref 30.0–36.0)
MCV: 89.5 fl (ref 78.0–100.0)
Monocytes Absolute: 0.6 10*3/uL (ref 0.1–1.0)
Monocytes Relative: 9.6 % (ref 3.0–12.0)
Neutro Abs: 4.4 10*3/uL (ref 1.4–7.7)
Neutrophils Relative %: 68.9 % (ref 43.0–77.0)
Platelets: 201 10*3/uL (ref 150.0–400.0)
RBC: 3.32 Mil/uL — ABNORMAL LOW (ref 3.87–5.11)
RDW: 15.7 % — ABNORMAL HIGH (ref 11.5–15.5)
WBC: 6.4 10*3/uL (ref 4.0–10.5)

## 2021-04-12 MED ORDER — FUROSEMIDE 20 MG PO TABS: 20.0000 mg | ORAL_TABLET | Freq: Every day | ORAL | 0 refills | Status: DC

## 2021-04-12 NOTE — Progress Notes (Signed)
Tara Collier is a 75 y.o. female here for a follow up of a pre-existing problem.  History of Present Illness:   Chief Complaint  Patient presents with   Hypertension     HPI  HTN Currently taking coreg 12.5 mg BID, losartan 100 mg, norvasc 5 mg daily. The norvasc 5 mg daily was added by Dr. Rudene Anda about 6 weeks ago. At home blood pressure readings are: not checked. Patient denies chest pain, blurred vision, dizziness, unusual headaches. She has developed LE swelling and increased SOB compared to her baseline since starting norvasc. Patient is compliant with medication. Denies excessive caffeine intake, stimulant usage, excessive alcohol intake, or increase in salt consumption.  BP Readings from Last 3 Encounters:  04/12/21 (!) 144/84  02/22/21 138/88  02/19/21 (!) 152/82   Wt Readings from Last 4 Encounters:  04/12/21 255 lb (115.7 kg)  02/22/21 251 lb (113.9 kg)  02/19/21 254 lb 9.6 oz (115.5 kg)  01/15/21 245 lb 12.8 oz (111.5 kg)    Lower extremity swelling Patient reports an increase in lower extremity swelling the past 2 to 3 weeks.  She also reports an increase in shortness of breath compared to her baseline.  She does admit to eating a lot of salt.  Denies calf pain or asymmetry in LE. she does a lot of sitting and does not necessarily keep her legs elevated.  She does not wear compression hose.  Last echocardiogram done earlier this year in February.  She does take Eliquis as prescribed.  Past Medical History:  Diagnosis Date   Arthritis    Atrial fibrillation (HCC) 06/03/2013   Last Assessment & Plan:  Formatting of this note might be different from the original. Continue propafenone for rhythm control .Patient has clear symptomatic benefit with sinus rhythm maintenance. CHADS-Vasc score over 2, continue anticoagulation with eliquis.   HTN (hypertension) 06/03/2013   Last Assessment & Plan:  Formatting of this note might be different from the original. Hypertension is  well controlled.  Continue current medical regimen and low sodium diet.  Reevaluate at next office visit   Hypercholesterolemia    Restless leg syndrome    Urinary incontinence 08/08/2020     Social History   Tobacco Use   Smoking status: Never   Smokeless tobacco: Never  Substance Use Topics   Alcohol use: No   Drug use: No    Past Surgical History:  Procedure Laterality Date   CARDIOVERSION     RADIOFREQUENCY ABLATION     ROTATOR CUFF REPAIR     TUBAL LIGATION      History reviewed. No pertinent family history.  Allergies  Allergen Reactions   Tape Rash   Bee Venom Rash   Norvasc [Amlodipine] Swelling    Current Medications:   Current Outpatient Medications:    apixaban (ELIQUIS) 5 MG TABS tablet, Take 1 tablet (5 mg total) by mouth 2 (two) times daily., Disp: 180 tablet, Rfl: 3   atorvastatin (LIPITOR) 40 MG tablet, Take 1 tablet (40 mg total) by mouth daily., Disp: 90 tablet, Rfl: 3   carvedilol (COREG) 12.5 MG tablet, Take 1 tablet (12.5 mg total) by mouth 2 (two) times daily with a meal., Disp: 180 tablet, Rfl: 3   diclofenac Sodium (VOLTAREN) 1 % GEL, Apply 2 g topically 4 (four) times daily., Disp: , Rfl:    furosemide (LASIX) 20 MG tablet, Take 1 tablet (20 mg total) by mouth daily., Disp: 30 tablet, Rfl: 0   gabapentin (NEURONTIN) 100 MG  capsule, Take 1 capsule (100 mg total) by mouth at bedtime., Disp: 90 capsule, Rfl: 3   losartan (COZAAR) 100 MG tablet, Take 1 tablet (100 mg total) by mouth daily., Disp: 90 tablet, Rfl: 3   meclizine (ANTIVERT) 25 MG tablet, Take 1 tablet (25 mg total) by mouth 3 (three) times daily as needed for dizziness., Disp: 30 tablet, Rfl: 3   polyethylene glycol powder (GLYCOLAX/MIRALAX) 17 GM/SCOOP powder, Take 1 g by mouth daily as needed., Disp: , Rfl:    Vibegron (GEMTESA) 75 MG TABS, Take by mouth., Disp: , Rfl:    acetaminophen (TYLENOL) 500 MG tablet, Take 500 mg by mouth every 6 (six) hours as needed for mild pain or moderate  pain. (Patient not taking: Reported on 04/12/2021), Disp: , Rfl:    Review of Systems:   ROS Negative unless otherwise specified per HPI.  Vitals:   Vitals:   04/12/21 1348  BP: (!) 144/84  Pulse: 67  Temp: 98.2 F (36.8 C)  TempSrc: Temporal  SpO2: 96%  Weight: 255 lb (115.7 kg)  Height: 5\' 4"  (1.626 m)     Body mass index is 43.77 kg/m.  Physical Exam:   Physical Exam Vitals and nursing note reviewed.  Constitutional:      General: She is not in acute distress.    Appearance: She is well-developed. She is not ill-appearing or toxic-appearing.  Cardiovascular:     Rate and Rhythm: Normal rate and regular rhythm.     Pulses: Normal pulses.     Heart sounds: Normal heart sounds, S1 normal and S2 normal.  Pulmonary:     Effort: Pulmonary effort is normal.     Breath sounds: Normal breath sounds.  Musculoskeletal:     Right lower leg: 2+ Pitting Edema present.     Left lower leg: 2+ Pitting Edema present.  Skin:    General: Skin is warm and dry.  Neurological:     Mental Status: She is alert.     GCS: GCS eye subscore is 4. GCS verbal subscore is 5. GCS motor subscore is 6.  Psychiatric:        Speech: Speech normal.        Behavior: Behavior normal. Behavior is cooperative.      Assessment and Plan:   Murna was seen today for hypertension.  Diagnoses and all orders for this visit:  Primary hypertension Above goal today Stop amlodipine due to increase in leg swelling Repeat blood work, update renal function and lytes and blood counts At Lasix 20 mg daily Follow up in 2 weeks, sooner if concerns  Leg swelling Worsening Update blood work and add Lasix 20 mg daily I advised her to weigh herself daily and document these for Joyce Gross to review and I also gave her parameters for when to call us with excessive weight gain We will also add a chest x-ray given her report of increased shortness of breath She is in no acute distress today -     CBC with  Differential/Platelet -     Comprehensive metabolic panel -     DG Chest 2 View; Future  Other orders -     furosemide (LASIX) 20 MG tablet; Take 1 tablet (20 mg total) by mouth daily.   [place], PA-C

## 2021-04-12 NOTE — Patient Instructions (Signed)
It was great to see you!  Avoid salt!!  Update blood work today.  Elevate legs and wear compression  Start lasix 20 mg daily in the morning  STOP amlodopine  Keep a track of your blood pressure -- write it down for [place]  Buy a scale and keep a log of weights  Call us or cardiology: Anytime you have any of the following symptoms: 1) 3 pound weight gain in 24 hours or 5 pounds in 1 week 2) shortness of breath, with or without a dry hacking cough 3) swelling in the hands, feet or stomach 4) if you have to sleep on extra pillows at night in order to breathe.   ALSO PLEASE GET A CHEST XRAY An order for an xray has been put in for you. To get your xray, you can walk in at the [facility] location without a scheduled appointment.  The address is [address] It is across the street from [facility]. X-ray is located in the basement.  Hours of operation are M-F 8:30am to 5:00pm. Please note that they are closed for lunch between 12:30 and 1:00pm.  Let's follow-up in 2 weeks, sooner if you have concerns.  Take care,  Jarold Motto PA-C

## 2021-04-15 ENCOUNTER — Other Ambulatory Visit: Payer: Self-pay | Admitting: Physician Assistant

## 2021-04-15 DIAGNOSIS — R710 Precipitous drop in hematocrit: Secondary | ICD-10-CM

## 2021-04-15 DIAGNOSIS — Z1211 Encounter for screening for malignant neoplasm of colon: Secondary | ICD-10-CM

## 2021-04-16 ENCOUNTER — Other Ambulatory Visit: Payer: Self-pay

## 2021-04-16 ENCOUNTER — Other Ambulatory Visit (INDEPENDENT_AMBULATORY_CARE_PROVIDER_SITE_OTHER): Payer: Medicare HMO

## 2021-04-16 DIAGNOSIS — R710 Precipitous drop in hematocrit: Secondary | ICD-10-CM | POA: Diagnosis not present

## 2021-04-17 LAB — BASIC METABOLIC PANEL
BUN: 22 mg/dL (ref 6–23)
CO2: 27 mEq/L (ref 19–32)
Calcium: 8.9 mg/dL (ref 8.4–10.5)
Chloride: 104 mEq/L (ref 96–112)
Creatinine, Ser: 1.05 mg/dL (ref 0.40–1.20)
GFR: 52.6 mL/min — ABNORMAL LOW (ref >60.00)
Glucose, Bld: 80 mg/dL (ref 70–99)
Potassium: 3.8 mEq/L (ref 3.5–5.1)
Sodium: 143 mEq/L (ref 135–145)

## 2021-04-17 LAB — VITAMIN B12: Vitamin B-12: 372 pg/mL (ref 211–911)

## 2021-04-17 LAB — IRON,TIBC AND FERRITIN PANEL
%SAT: 17 % (calc) (ref 16–45)
Ferritin: 25 ng/mL (ref 16–288)
Iron: 63 ug/dL (ref 45–160)
TIBC: 379 mcg/dL (calc) (ref 250–450)

## 2021-04-17 LAB — CBC WITH DIFFERENTIAL/PLATELET
Basophils Absolute: 0.1 10*3/uL (ref 0.0–0.1)
Basophils Relative: 1.1 % (ref 0.0–3.0)
Eosinophils Absolute: 0.2 10*3/uL (ref 0.0–0.7)
Eosinophils Relative: 3.7 % (ref 0.0–5.0)
HCT: 32.8 % — ABNORMAL LOW (ref 36.0–46.0)
Hemoglobin: 10.7 g/dL — ABNORMAL LOW (ref 12.0–15.0)
Lymphocytes Relative: 17 % (ref 12.0–46.0)
Lymphs Abs: 0.9 10*3/uL (ref 0.7–4.0)
MCHC: 32.8 g/dL (ref 30.0–36.0)
MCV: 89.3 fl (ref 78.0–100.0)
Monocytes Absolute: 0.5 10*3/uL (ref 0.1–1.0)
Monocytes Relative: 9.4 % (ref 3.0–12.0)
Neutro Abs: 3.6 10*3/uL (ref 1.4–7.7)
Neutrophils Relative %: 68.8 % (ref 43.0–77.0)
Platelets: 229 10*3/uL (ref 150.0–400.0)
RBC: 3.67 Mil/uL — ABNORMAL LOW (ref 3.87–5.11)
RDW: 15.3 % (ref 11.5–15.5)
WBC: 5.2 10*3/uL (ref 4.0–10.5)

## 2021-04-17 LAB — HEPATIC FUNCTION PANEL
ALT: 13 U/L (ref 0–35)
AST: 20 U/L (ref 0–37)
Albumin: 3.9 g/dL (ref 3.5–5.2)
Alkaline Phosphatase: 104 U/L (ref 39–117)
Bilirubin, Direct: 0.2 mg/dL (ref 0.0–0.3)
Total Bilirubin: 1 mg/dL (ref 0.2–1.2)
Total Protein: 6.7 g/dL (ref 6.0–8.3)

## 2021-04-17 LAB — HAPTOGLOBIN: Haptoglobin: 227 mg/dL — ABNORMAL HIGH (ref 43–212)

## 2021-04-18 ENCOUNTER — Ambulatory Visit: Payer: Medicare HMO | Admitting: Gastroenterology

## 2021-04-19 ENCOUNTER — Encounter: Payer: Self-pay | Admitting: Gastroenterology

## 2021-04-19 ENCOUNTER — Ambulatory Visit (INDEPENDENT_AMBULATORY_CARE_PROVIDER_SITE_OTHER): Payer: Medicare HMO | Admitting: Gastroenterology

## 2021-04-19 VITALS — BP 124/68 | HR 77 | Temp 98.2°F | Ht 62.0 in | Wt 248.6 lb

## 2021-04-19 DIAGNOSIS — D649 Anemia, unspecified: Secondary | ICD-10-CM

## 2021-04-19 NOTE — Progress Notes (Signed)
HPI : 75 y/o fm with a history of atrial fibrillation is referred to GI by Jarold Motto for chronic anemia.  The patient moved from [place] about a year ago, and so her medical records prior to this are not available for my review at this time.  It appears her baseline hemoglobin this year was around 11 (11.2 in January 2022) her hemoglobin was checked by her primary care provider was found to have dropped down to 9.9, but rechecked a few days later was back up to 10.7.  Her MCV is in the upper 80s/low 90s.  An iron panel was not strongly suggestive of iron deficiency (serum iron 63, TIBC 379, saturation 17%, ferritin 25). The patient denies any history of overt GI blood loss.  She does complain of chronic abdominal discomfort and constipation.  Sometimes, she will go a week without a bowel movement.  Diarrhea is not a problem for her.  She takes MiraLAX as needed.  She denies any unintentional weight loss, rather her weight has been increasing over the past year.  She has been having difficulty with increasing shortness of breath, which is felt to be most likely related to her atrial fibrillation.  She is being evaluated for potential ablation by her cardiologist.  She has a follow-up with them next month. Her last colonoscopy was "a long time ago".    Past Medical History:  Diagnosis Date   Arthritis    Atrial fibrillation (HCC) 06/03/2013   Last Assessment & Plan:  Formatting of this note might be different from the original. Continue propafenone for rhythm control .Patient has clear symptomatic benefit with sinus rhythm maintenance. CHADS-Vasc score over 2, continue anticoagulation with eliquis.   HTN (hypertension) 06/03/2013   Last Assessment & Plan:  Formatting of this note might be different from the original. Hypertension is well controlled.  Continue current medical regimen and low sodium diet.  Reevaluate at next office visit   Hypercholesterolemia    Restless leg syndrome    Urinary  incontinence 08/08/2020     Past Surgical History:  Procedure Laterality Date   CARDIOVERSION     RADIOFREQUENCY ABLATION     ROTATOR CUFF REPAIR     TUBAL LIGATION     Family History  Problem Relation Age of Onset   Heart disease Mother    Appendicitis Mother    Appendicitis Father    Heart disease Father    Heart disease Maternal Grandmother    Social History   Tobacco Use   Smoking status: Never   Smokeless tobacco: Never  Substance Use Topics   Alcohol use: No   Drug use: No   Current Outpatient Medications  Medication Sig Dispense Refill   apixaban (ELIQUIS) 5 MG TABS tablet Take 1 tablet (5 mg total) by mouth 2 (two) times daily. 180 tablet 3   atorvastatin (LIPITOR) 40 MG tablet Take 1 tablet (40 mg total) by mouth daily. 90 tablet 3   carvedilol (COREG) 12.5 MG tablet Take 1 tablet (12.5 mg total) by mouth 2 (two) times daily with a meal. 180 tablet 3   diclofenac Sodium (VOLTAREN) 1 % GEL Apply 2 g topically 4 (four) times daily.     furosemide (LASIX) 20 MG tablet Take 1 tablet (20 mg total) by mouth daily. 30 tablet 0   gabapentin (NEURONTIN) 100 MG capsule Take 1 capsule (100 mg total) by mouth at bedtime. 90 capsule 3   losartan (COZAAR) 100 MG tablet Take 1 tablet (100  mg total) by mouth daily. 90 tablet 3   meclizine (ANTIVERT) 25 MG tablet Take 1 tablet (25 mg total) by mouth 3 (three) times daily as needed for dizziness. 30 tablet 3   polyethylene glycol powder (GLYCOLAX/MIRALAX) 17 GM/SCOOP powder Take 1 g by mouth daily as needed.     Vibegron (GEMTESA) 75 MG TABS Take by mouth.     acetaminophen (TYLENOL) 500 MG tablet Take 500 mg by mouth every 6 (six) hours as needed for mild pain or moderate pain. (Patient not taking: Reported on 04/12/2021)     No current facility-administered medications for this visit.   Allergies  Allergen Reactions   Tape Rash   Bee Venom Rash   Norvasc [Amlodipine] Swelling     Review of Systems: All systems reviewed  and negative except where noted in HPI.    DG Chest 2 View  Result Date: 04/14/2021 CLINICAL DATA:  Peripheral edema, shortness of breath, hypertension, atrial fibrillation EXAM: CHEST - 2 VIEW COMPARISON:  None. FINDINGS: Moderate cardiomegaly with slight central vascular prominence. Negative for CHF or definite edema. Minor basilar atelectasis. No effusion or pneumothorax. No focal pneumonia. Trachea midline. Aorta atherosclerotic and degenerative changes noted of the spine. IMPRESSION: Cardiomegaly without acute process. Electronically Signed   By: Judie Petit.  Shick M.D.   On: 04/14/2021 13:26    Physical Exam: BP 124/68   Pulse 77   Temp 98.2 F (36.8 C)   Ht 5\' 2"  (1.575 m)   Wt 248 lb 9.6 oz (112.8 kg)   SpO2 95%   BMI 45.47 kg/m  Constitutional: Pleasant,well-developed, Caucasian female in no acute distress, accompanied by daughter HEENT: Normocephalic and atraumatic. Conjunctivae are normal. No scleral icterus. Neck supple.  Cardiovascular: Normal rate, irregular rhythm. No murmurs Pulmonary/chest: Effort normal and breath sounds normal. No wheezing, rales or rhonchi. Abdominal: Soft, nondistended, nontender. Bowel sounds active throughout. There are no masses palpable. No hepatomegaly. Extremities: no edema Lymphadenopathy: No cervical adenopathy noted. Neurological: Alert and oriented to person place and time. Skin: Skin is warm and dry. No rashes noted. Psychiatric: Normal mood and affect. Behavior is normal.  CBC    Component Value Date/Time   WBC 5.2 04/16/2021 1518   RBC 3.67 (L) 04/16/2021 1518   HGB 10.7 (L) 04/16/2021 1518   HCT 32.8 (L) 04/16/2021 1518   PLT 229.0 04/16/2021 1518   MCV 89.3 04/16/2021 1518   MCH 29.6 08/08/2020 0935   MCHC 32.8 04/16/2021 1518   RDW 15.3 04/16/2021 1518   LYMPHSABS 0.9 04/16/2021 1518   MONOABS 0.5 04/16/2021 1518   EOSABS 0.2 04/16/2021 1518   BASOSABS 0.1 04/16/2021 1518    CMP     Component Value Date/Time   NA 143  04/16/2021 1518   K 3.8 04/16/2021 1518   CL 104 04/16/2021 1518   CO2 27 04/16/2021 1518   GLUCOSE 80 04/16/2021 1518   BUN 22 04/16/2021 1518   CREATININE 1.05 04/16/2021 1518   CREATININE 0.79 08/08/2020 0935   CALCIUM 8.9 04/16/2021 1518   PROT 6.7 04/16/2021 1518   ALBUMIN 3.9 04/16/2021 1518   AST 20 04/16/2021 1518   ALT 13 04/16/2021 1518   ALKPHOS 104 04/16/2021 1518   BILITOT 1.0 04/16/2021 1518   Apr 16, 2021 Iron 45 - 160 mcg/dL 63  79 R    TIBC Apr 18, 2021 - 450 mcg/dL (calc) 222     %SAT 16 - 45 % (calc) 17     Ferritin 16 - 288 ng/mL 25  Vitamin B-12 211 - 911 pg/mL 372  368    Results for JAMESE, TRAUGER (MRN [id number]) as of 04/22/2021 07:57  Ref. Range 08/08/2020 09:35 09/24/2020 10:39 01/15/2021 15:53 04/12/2021 14:33 04/16/2021 15:18  WBC Latest Ref Range: 4.0 - 10.5 K/uL 5.4 5.4 5.7 6.4 5.2  RBC Latest Ref Range: 3.87 - 5.11 Mil/uL 3.78 (L) 3.92 3.93 3.32 (L) 3.67 (L)  Hemoglobin Latest Ref Range: 12.0 - 15.0 g/dL 28.4 (L) 13.2 (L) 44.0 (L) 9.9 (L) 10.7 (L)  HCT Latest Ref Range: 36.0 - 46.0 % 34.3 (L) 34.4 (L) 34.5 (L) 29.7 (L) 32.8 (L)  MCV Latest Ref Range: 78.0 - 100.0 fl 90.7 87.6 87.7 89.5 89.3  MCH Latest Ref Range: 27.0 - 33.0 pg 29.6      MCHC Latest Ref Range: 30.0 - 36.0 g/dL 10.2 72.5 36.6 44.0 34.7  RDW Latest Ref Range: 11.5 - 15.5 % 12.9 14.4 15.0 15.7 (H) 15.3  Platelets Latest Ref Range: 150.0 - 400.0 K/uL 202 197.0 212.0 201.0 229.0  MPV Latest Ref Range: 7.5 - 12.5 fL 11.9         ASSESSMENT AND PLAN: 75 year old female with atrial fibrillation on Eliquis, referred for worsening normocytic anemia.  She does not have any overt GI blood loss, and her iron panel is not consistent with an iron deficiency anemia.  My suspicion for a chronic GI bleed is low.  Overall, her hemoglobin is only mildly down from what it was 9 months ago.  It seems unlikely that her worsening dyspnea would be related to the small drop in her hemoglobin and other etiologies should  be considered.  The patient is likely due for a screening colonoscopy and we discussed performing this procedure.  However given her worsening dyspnea and plans for cardiology follow-up, I recommended we hold off on scheduling her for an elective procedure at this time.  The patient is planning to repeat a CBC in 2 weeks, and if there has been a significant drop in her hemoglobin, then I think we will need to proceed with a more urgent endoscopic evaluation.  Anemia, normocytic - Repeat CBC in 2 weeks - Upper and lower endoscopy if significant drop in hemoglobin  Colon cancer screening -[facility] routine screening colonoscopy pending cardiology follow-up  Joie Hipps E. Tomasa Rand, MD [facility]  Jarold Motto, [place]

## 2021-04-19 NOTE — Patient Instructions (Addendum)
It was my pleasure to provide care to you today. Based on our discussion, I am providing you with my recommendations below:  RECOMMENDATION(S):   Metamucil daily Senna as needed if no bowel movement in 2-3 days  FOLLOW UP:  I would like for you to follow up with me in 3 months. Please call the office at [phone] to schedule your appointment. If your hemoglobin begins to trend down, please call to see me sooner   BMI:  If you are age 74 or older, your body mass index should be between 23-30. Your Body mass index is 45.47 kg/m. If this is out of the aforementioned range listed, please consider follow up with your Primary Care Provider.  MY CHART:  The  GI providers would like to encourage you to use [facility] to communicate with providers for non-urgent requests or questions.  Due to long hold times on the telephone, sending your provider a message by [facility] may be a faster and more efficient way to get a response.  Please allow 48 business hours for a response.  Please remember that this is for non-urgent requests.   Thank you for trusting me with your gastrointestinal care!    Scott E. Tomasa Rand, MD

## 2021-04-22 ENCOUNTER — Encounter: Payer: Self-pay | Admitting: Gastroenterology

## 2021-04-24 ENCOUNTER — Encounter: Payer: Self-pay | Admitting: Orthopedic Surgery

## 2021-04-24 ENCOUNTER — Other Ambulatory Visit: Payer: Self-pay

## 2021-04-24 ENCOUNTER — Ambulatory Visit: Payer: Medicare HMO | Admitting: Orthopedic Surgery

## 2021-04-24 DIAGNOSIS — M170 Bilateral primary osteoarthritis of knee: Secondary | ICD-10-CM

## 2021-04-24 MED ORDER — LIDOCAINE HCL 1 % IJ SOLN
5.0000 mL | INTRAMUSCULAR | Status: AC | PRN
Start: 2021-04-24 — End: 2021-04-24
  Administered 2021-04-24: 5 mL

## 2021-04-24 MED ORDER — METHYLPREDNISOLONE ACETATE 40 MG/ML IJ SUSP
40.0000 mg | INTRAMUSCULAR | Status: AC | PRN
  Administered 2021-04-24: 40 mg via INTRA_ARTICULAR

## 2021-04-24 MED ORDER — METHYLPREDNISOLONE ACETATE 40 MG/ML IJ SUSP
40.0000 mg | INTRAMUSCULAR | Status: AC | PRN
Start: 2021-04-24 — End: 2021-04-24
  Administered 2021-04-24: 40 mg via INTRA_ARTICULAR

## 2021-04-24 MED ORDER — BUPIVACAINE HCL 0.25 % IJ SOLN
4.0000 mL | INTRAMUSCULAR | Status: AC | PRN
  Administered 2021-04-24: 4 mL via INTRA_ARTICULAR

## 2021-04-24 MED ORDER — LIDOCAINE HCL 1 % IJ SOLN
5.0000 mL | INTRAMUSCULAR | Status: AC | PRN
  Administered 2021-04-24: 5 mL

## 2021-04-24 NOTE — Progress Notes (Signed)
   Procedure Note  Patient: Tara Collier             Date of Birth: [birth year]           MRN: [id number]             Visit Date: 04/24/2021  Procedures: Visit Diagnoses:  1. Bilateral primary osteoarthritis of knee     Large Joint Inj: bilateral knee on 04/24/2021 5:04 PM Indications: diagnostic evaluation, joint swelling and pain Details: 18 G 1.5 in needle, superolateral approach  Arthrogram: No  Medications (Right): 5 mL lidocaine 1 %; 4 mL bupivacaine 0.25 %; 40 mg methylPREDNISolone acetate 40 MG/ML Medications (Left): 5 mL lidocaine 1 %; 4 mL bupivacaine 0.25 %; 40 mg methylPREDNISolone acetate 40 MG/ML Aspirate (Left): 5 mL Outcome: tolerated well, no immediate complications Procedure, treatment alternatives, risks and benefits explained, specific risks discussed. Consent was given by the patient. Immediately prior to procedure a time out was called to verify the correct patient, procedure, equipment, support staff and site/side marked as required. Patient was prepped and draped in the usual sterile fashion.

## 2021-04-30 ENCOUNTER — Ambulatory Visit: Payer: Medicare HMO | Admitting: Physician Assistant

## 2021-05-01 DIAGNOSIS — N3946 Mixed incontinence: Secondary | ICD-10-CM | POA: Diagnosis not present

## 2021-05-09 ENCOUNTER — Ambulatory Visit: Payer: Medicare HMO | Admitting: Internal Medicine

## 2021-05-09 ENCOUNTER — Other Ambulatory Visit: Payer: Self-pay | Admitting: Physician Assistant

## 2021-05-10 ENCOUNTER — Ambulatory Visit: Payer: Medicare HMO | Admitting: Physician Assistant

## 2021-05-10 NOTE — Progress Notes (Incomplete)
Tara Collier is a 75 y.o. female here for a follow up of a pre-existing problem of osteoarthritis of bilateral knee.   SCRIBE STATEMENT  History of Present Illness:   No chief complaint on file.   HPI  Arthritis Patient presents for two week follow up for osteoarthritis of knee. Onset ***. Denies ***.   HTN ***  A-Fib ***    Past Medical History:  Diagnosis Date   Arthritis    Atrial fibrillation (HCC) 06/03/2013   Last Assessment & Plan:  Formatting of this note might be different from the original. Continue propafenone for rhythm control .Patient has clear symptomatic benefit with sinus rhythm maintenance. CHADS-Vasc score over 2, continue anticoagulation with eliquis.   HTN (hypertension) 06/03/2013   Last Assessment & Plan:  Formatting of this note might be different from the original. Hypertension is well controlled.  Continue current medical regimen and low sodium diet.  Reevaluate at next office visit   Hypercholesterolemia    Restless leg syndrome    Urinary incontinence 08/08/2020     Social History   Tobacco Use   Smoking status: Never   Smokeless tobacco: Never  Substance Use Topics   Alcohol use: No   Drug use: No    Past Surgical History:  Procedure Laterality Date   CARDIOVERSION     RADIOFREQUENCY ABLATION     ROTATOR CUFF REPAIR     TUBAL LIGATION      Family History  Problem Relation Age of Onset   Heart disease Mother    Appendicitis Mother    Appendicitis Father    Heart disease Father    Heart disease Maternal Grandmother     Allergies  Allergen Reactions   Tape Rash   Bee Venom Rash   Norvasc [Amlodipine] Swelling    Current Medications:   Current Outpatient Medications:    acetaminophen (TYLENOL) 500 MG tablet, Take 500 mg by mouth every 6 (six) hours as needed for mild pain or moderate pain. (Patient not taking: Reported on 04/12/2021), Disp: , Rfl:    apixaban (ELIQUIS) 5 MG TABS tablet, Take 1 tablet (5 mg total) by mouth  2 (two) times daily., Disp: 180 tablet, Rfl: 3   atorvastatin (LIPITOR) 40 MG tablet, Take 1 tablet (40 mg total) by mouth daily., Disp: 90 tablet, Rfl: 3   carvedilol (COREG) 12.5 MG tablet, Take 1 tablet (12.5 mg total) by mouth 2 (two) times daily with a meal., Disp: 180 tablet, Rfl: 3   diclofenac Sodium (VOLTAREN) 1 % GEL, Apply 2 g topically 4 (four) times daily., Disp: , Rfl:    furosemide (LASIX) 20 MG tablet, TAKE 1 TABLET BY MOUTH EVERY DAY, Disp: 30 tablet, Rfl: 0   gabapentin (NEURONTIN) 100 MG capsule, Take 1 capsule (100 mg total) by mouth at bedtime., Disp: 90 capsule, Rfl: 3   losartan (COZAAR) 100 MG tablet, Take 1 tablet (100 mg total) by mouth daily., Disp: 90 tablet, Rfl: 3   meclizine (ANTIVERT) 25 MG tablet, Take 1 tablet (25 mg total) by mouth 3 (three) times daily as needed for dizziness., Disp: 30 tablet, Rfl: 3   polyethylene glycol powder (GLYCOLAX/MIRALAX) 17 GM/SCOOP powder, Take 1 g by mouth daily as needed., Disp: , Rfl:    Vibegron (GEMTESA) 75 MG TABS, Take by mouth., Disp: , Rfl:    Review of Systems:   ROS Negative unless otherwise specified per HPI.  Vitals:   There were no vitals filed for this visit.   There  is no height or weight on file to calculate BMI.  Physical Exam:   Physical Exam  Assessment and Plan:       I,Essence Turner,acting as a scribe for Jarold Motto, PA.,have documented all relevant documentation on the behalf of Jarold Motto, PA,as directed by  Jarold Motto, PA while in the presence of Jarold Motto, [place].   Jarold Motto, PA-C

## 2021-05-16 ENCOUNTER — Encounter: Payer: Self-pay | Admitting: Physician Assistant

## 2021-05-30 ENCOUNTER — Telehealth: Payer: Self-pay | Admitting: Physician Assistant

## 2021-05-30 NOTE — Chronic Care Management (AMB) (Signed)
  Care Management   Follow Up Note   05/30/2021 Name: Tara Collier MRN: [id number] DOB: January 29, 1946   Referred by: Jarold Motto, [place] Reason for referral : No chief complaint on file.   An unsuccessful telephone outreach was attempted today. The patient was referred to the case management team for assistance with care management and care coordination.   Follow Up Plan: No further follow up required:    SIGNATURE  Leeann Aldridge Upstream Scheduler

## 2021-05-31 ENCOUNTER — Telehealth: Payer: Self-pay

## 2021-05-31 ENCOUNTER — Encounter (HOSPITAL_COMMUNITY): Payer: Self-pay | Admitting: Physician Assistant

## 2021-05-31 ENCOUNTER — Other Ambulatory Visit: Payer: Self-pay

## 2021-05-31 ENCOUNTER — Ambulatory Visit (HOSPITAL_COMMUNITY)
Admission: RE | Admit: 2021-05-31 | Discharge: 2021-05-31 | Disposition: A | Discharge status: Home / Self Care | Payer: Medicare HMO | Source: Ambulatory Visit | Attending: Physician Assistant | Admitting: Physician Assistant

## 2021-05-31 VITALS — BP 142/70 | HR 64 | Ht 62.0 in | Wt 252.6 lb

## 2021-05-31 DIAGNOSIS — Z713 Dietary counseling and surveillance: Secondary | ICD-10-CM | POA: Insufficient documentation

## 2021-05-31 DIAGNOSIS — E669 Obesity, unspecified: Secondary | ICD-10-CM | POA: Diagnosis not present

## 2021-05-31 DIAGNOSIS — G4733 Obstructive sleep apnea (adult) (pediatric): Secondary | ICD-10-CM | POA: Insufficient documentation

## 2021-05-31 DIAGNOSIS — Z6841 Body Mass Index (BMI) 40.0 and over, adult: Secondary | ICD-10-CM | POA: Insufficient documentation

## 2021-05-31 DIAGNOSIS — Z7901 Long term (current) use of anticoagulants: Secondary | ICD-10-CM | POA: Insufficient documentation

## 2021-05-31 DIAGNOSIS — I10 Essential (primary) hypertension: Secondary | ICD-10-CM | POA: Insufficient documentation

## 2021-05-31 DIAGNOSIS — D6869 Other thrombophilia: Secondary | ICD-10-CM | POA: Insufficient documentation

## 2021-05-31 DIAGNOSIS — Z8249 Family history of ischemic heart disease and other diseases of the circulatory system: Secondary | ICD-10-CM | POA: Diagnosis not present

## 2021-05-31 DIAGNOSIS — I4811 Longstanding persistent atrial fibrillation: Secondary | ICD-10-CM | POA: Diagnosis not present

## 2021-05-31 DIAGNOSIS — I483 Typical atrial flutter: Secondary | ICD-10-CM | POA: Diagnosis not present

## 2021-05-31 MED ORDER — APIXABAN 5 MG PO TABS: 5.0000 mg | ORAL_TABLET | Freq: Two times a day (BID) | ORAL | 6 refills | Status: DC

## 2021-05-31 MED ORDER — APIXABAN 5 MG PO TABS: 5.0000 mg | ORAL_TABLET | Freq: Two times a day (BID) | ORAL | 1 refills | Status: DC

## 2021-05-31 NOTE — Progress Notes (Signed)
Primary Care Physician: Jarold Motto, [place] Primary Cardiologist: Dr Jacques Navy  Primary Electrophysiologist: none Referring Physician: Dr Tara Collier is a 75 y.o. female with a history of HTN, HLD, atrial fibrillation, atrial flutter who presents for follow up in the [facility]. The patient was initially diagnosed with atrial fibrillation and atrial flutter remotely and underwent flutter ablation in 2013. She has previously been on propafenone but she was lost to cardiology follow up and discontinued this on her own. Patient is on Eliquis for a CHADS2VASC score of 3. She reports dyspnea with exertion for the last 2 years. She is unsure how long she has been in afib.   On follow up today, patient reports that she is about the same since her last visit. She has some days where she feels well and is able to "clean the whole house" and other days where she is very tired. She remains in rate controlled afib. She is currently undergoing evaluation with PCP and GI for anemia. She denies any bleeding issues on anticoagulation.   Today, she denies symptoms of palpitations, chest pain, orthopnea, PND, lower extremity edema, dizziness, presyncope, syncope, bleeding, or neurologic sequela. The patient is tolerating medications without difficulties and is otherwise without complaint today.    Atrial Fibrillation Risk Factors:  she does have symptoms or diagnosis of sleep apnea. She did not follow up for sleep consult.  she does not have a history of rheumatic fever. she does not have a history of alcohol use. The patient does not have a history of early familial atrial fibrillation or other arrhythmias.  she has a BMI of Body mass index is 46.2 kg/m.Marland Kitchen Filed Weights   05/31/21 0921  Weight: 114.6 kg      Family History  Problem Relation Age of Onset   Heart disease Mother    Appendicitis Mother    Appendicitis Father    Heart disease Father    Heart  disease Maternal Grandmother      Atrial Fibrillation Management history:  Previous antiarrhythmic drugs: propafenone  Previous cardioversions: 2013 Previous ablations: 07/19/12 (flutter) CHADS2VASC score: 3 Anticoagulation history: Eliquis   Past Medical History:  Diagnosis Date   Arthritis    Atrial fibrillation (HCC) 06/03/2013   Last Assessment & Plan:  Formatting of this note might be different from the original. Continue propafenone for rhythm control .Patient has clear symptomatic benefit with sinus rhythm maintenance. CHADS-Vasc score over 2, continue anticoagulation with eliquis.   HTN (hypertension) 06/03/2013   Last Assessment & Plan:  Formatting of this note might be different from the original. Hypertension is well controlled.  Continue current medical regimen and low sodium diet.  Reevaluate at next office visit   Hypercholesterolemia    Restless leg syndrome    Urinary incontinence 08/08/2020   Past Surgical History:  Procedure Laterality Date   CARDIOVERSION     RADIOFREQUENCY ABLATION     ROTATOR CUFF REPAIR     TUBAL LIGATION      Current Outpatient Medications  Medication Sig Dispense Refill   acetaminophen (TYLENOL) 500 MG tablet Take 500 mg by mouth every 6 (six) hours as needed for mild pain or moderate pain.     atorvastatin (LIPITOR) 40 MG tablet Take 1 tablet (40 mg total) by mouth daily. 90 tablet 3   carvedilol (COREG) 12.5 MG tablet Take 1 tablet (12.5 mg total) by mouth 2 (two) times daily with a meal. 180 tablet 3  diclofenac Sodium (VOLTAREN) 1 % GEL Apply 2 g topically 4 (four) times daily.     gabapentin (NEURONTIN) 100 MG capsule Take 1 capsule (100 mg total) by mouth at bedtime. 90 capsule 3   losartan (COZAAR) 100 MG tablet Take 1 tablet (100 mg total) by mouth daily. 90 tablet 3   meclizine (ANTIVERT) 25 MG tablet Take 1 tablet (25 mg total) by mouth 3 (three) times daily as needed for dizziness. 30 tablet 3   polyethylene glycol powder  (GLYCOLAX/MIRALAX) 17 GM/SCOOP powder Take 1 g by mouth daily as needed.     Vibegron (GEMTESA) 75 MG TABS Take by mouth.     apixaban (ELIQUIS) 5 MG TABS tablet Take 1 tablet (5 mg total) by mouth 2 (two) times daily. 60 tablet 6   No current facility-administered medications for this encounter.    Allergies  Allergen Reactions   Tape Rash   Bee Venom Rash   Norvasc [Amlodipine] Swelling    Social History   Socioeconomic History   Marital status: Single    Spouse name: Not on file   Number of children: Not on file   Years of education: Not on file   Highest education level: Not on file  Occupational History   Not on file  Tobacco Use   Smoking status: Never   Smokeless tobacco: Never  Substance and Sexual Activity   Alcohol use: No   Drug use: No   Sexual activity: Not on file  Other Topics Concern   Not on file  Social History Narrative   Daughter in [place]   Lives by herself   Worked for [employer] for 20+ years   Social Determinants of Health   Financial Resource Strain: Not on file  Food Insecurity: Not on file  Transportation Needs: Not on file  Physical Activity: Not on file  Stress: Not on file  Social Connections: Not on file  Intimate Partner Violence: Not on file     ROS- All systems are reviewed and negative except as per the HPI above.  Physical Exam: Vitals:   05/31/21 0921  BP: (!) 142/70  Pulse: 64  Weight: 114.6 kg  Height: 5\' 2"  (1.575 m)    GEN- The patient is a well appearing obese female, alert and oriented x 3 today.   HEENT-head normocephalic, atraumatic, sclera clear, conjunctiva pink, hearing intact, trachea midline. Lungs- Clear to ausculation bilaterally, normal work of breathing Heart- irregular rate and rhythm, no murmurs, rubs or gallops  GI- soft, NT, ND, + BS Extremities- no clubbing, cyanosis, or edema MS- no significant deformity or atrophy Skin- no rash or lesion Psych- euthymic mood, full affect Neuro- strength and  sensation are intact   Wt Readings from Last 3 Encounters:  05/31/21 114.6 kg  04/19/21 112.8 kg  04/12/21 115.7 kg    EKG today demonstrates  Afib Vent. rate 64 BPM PR interval * ms QRS duration 82 ms QT/QTcB 444/458 ms  Echo 10/05/20 demonstrated  1. Left ventricular ejection fraction, by estimation, is 60 to 65%. The  left ventricle has normal function. The left ventricle has no regional  wall motion abnormalities. Left ventricular diastolic function could not be evaluated.   2. Right ventricular systolic function is normal. The right ventricular  size is mildly enlarged. There is mildly elevated pulmonary artery  systolic pressure. The estimated right ventricular systolic pressure is 44.0 mmHg.   3. Right atrial size was moderately dilated.   4. The mitral valve is  normal in structure. No evidence of mitral valve regurgitation. No evidence of mitral stenosis.   5. The aortic valve is tricuspid. Aortic valve regurgitation is not  visualized. Mild aortic valve sclerosis is present, with no evidence of aortic valve stenosis.   6. The inferior vena cava is normal in size with greater than 50%  respiratory variability, suggesting right atrial pressure of 3 mmHg.   LA 5.8 cm  Epic records are reviewed at length today  CHA2DS2-VASc Score = 3  The patient's score is based upon: CHF History: 0 HTN History: 1 Diabetes History: 0 Stroke History: 0 Vascular Disease History: 0 Age Score: 1 Gender Score: 1      ASSESSMENT AND PLAN: 1. Longstanding Persistent Atrial Fibrillation/typical atrial flutter The patient's CHA2DS2-VASc score is 3, indicating a 3.2% annual risk of stroke.   Patient in rate controlled afib, unclear how symptomatic she is with her arrhythmia. We again discussed rate vs rhythm control. Specifically propafenone, dofetilide, and amiodarone. Information sheet about dofetilide admission given.  Ultimately, rate control may be her best option with her severally  dilated LA and no clear symptoms. She has not followed through with contacting sleep medicine or [facility] PREP. She states today that she will focus on these things.  Continue Eliquis 5 mg BID Continue Coreg 12.5 mg BID  2. Secondary Hypercoagulable State (ICD10:  D68.69) The patient is at significant risk for stroke/thromboembolism based upon her CHA2DS2-VASc Score of 3.  Continue Apixaban (Eliquis).   3. Obesity Body mass index is 46.2 kg/m. Lifestyle modification was discussed and encouraged including regular physical activity and weight reduction. Referred again to [facility] exercise program  4. Suspected OSA Patient states she will reach out to sleep medicine to reschedule consult.   5. HTN Stable, no changes today.   Follow up with Edd Fabian as scheduled. AF clinic in 6 months.    Tara Loa PA-C Afib Clinic [facility] [address] [place], [place] [address] [phone] 05/31/2021 11:29 AM

## 2021-05-31 NOTE — Telephone Encounter (Signed)
Was asked to contact this pt via her daughter's cell phone for PREP class referral; left voicemail asking for return call.

## 2021-06-03 ENCOUNTER — Other Ambulatory Visit: Payer: Self-pay | Admitting: Physician Assistant

## 2021-06-10 ENCOUNTER — Telehealth: Payer: Self-pay

## 2021-06-10 NOTE — Telephone Encounter (Signed)
Follow up PREP referral (4th call), called home phone; left voicemail asking for return call.

## 2021-06-24 ENCOUNTER — Telehealth: Payer: Self-pay | Admitting: Physician Assistant

## 2021-06-24 NOTE — Progress Notes (Signed)
  Care Management   Follow Up Note   06/24/2021 Name: Evalisse Prajapati MRN: [id number] DOB: Sep 28, 1945   Referred by: Jarold Motto, [place] Reason for referral : No chief complaint on file.   Third unsuccessful telephone outreach was attempted today. The patient was referred to the case management team for assistance with care management and care coordination. The patient's primary care provider has been notified of our unsuccessful attempts to make or maintain contact with the patient. The care management team is pleased to engage with this patient at any time in the future should he/she be interested in assistance from the care management team.   Follow Up Plan: No further follow up required:    SIGNATURE  Leeann Aldridge Upstream Scheduler

## 2021-06-26 ENCOUNTER — Telehealth: Payer: Self-pay | Admitting: Physician Assistant

## 2021-06-26 NOTE — Progress Notes (Signed)
  Care Management   Follow Up Note   06/26/2021 Name: Tara Collier MRN: [id number] DOB: 1945-11-07   Referred by: Jarold Motto, [place] Reason for referral : No chief complaint on file.   An unsuccessful telephone outreach was attempted today. The patient was referred to the case management team for assistance with care management and care coordination.   Follow Up Plan: No further follow up required:    SIGNATURE  Leeann Aldridge Upstream Scheduler

## 2021-07-24 ENCOUNTER — Ambulatory Visit: Payer: Medicare HMO | Admitting: Orthopedic Surgery

## 2021-08-05 NOTE — Progress Notes (Deleted)
Cardiology Clinic Note   Patient Name: Odette Watanabe Date of Encounter: 08/05/2021  Primary Care Provider:  Jarold Motto, PA Primary Cardiologist:  Parke Poisson, MD  Patient Profile    Jadwiga Faidley 75 year old female presents to the clinic today for follow-up evaluation of her atrial fibrillation and hypertension.  Past Medical History    Past Medical History:  Diagnosis Date   Arthritis    Atrial fibrillation (HCC) 06/03/2013   Last Assessment & Plan:  Formatting of this note might be different from the original. Continue propafenone for rhythm control .Patient has clear symptomatic benefit with sinus rhythm maintenance. CHADS-Vasc score over 2, continue anticoagulation with eliquis.   HTN (hypertension) 06/03/2013   Last Assessment & Plan:  Formatting of this note might be different from the original. Hypertension is well controlled.  Continue current medical regimen and low sodium diet.  Reevaluate at next office visit   Hypercholesterolemia    Restless leg syndrome    Urinary incontinence 08/08/2020   Past Surgical History:  Procedure Laterality Date   CARDIOVERSION     RADIOFREQUENCY ABLATION     ROTATOR CUFF REPAIR     TUBAL LIGATION      Allergies  Allergies  Allergen Reactions   Tape Rash   Bee Venom Rash   Norvasc [Amlodipine] Swelling    History of Present Illness    Roselyne Stalnaker has a PMH of hypertension, atrial fibrillation, hypercholesterolemia, RLS and secondary hypercoagulable state.  She was seen by Dr. Doreene Adas on 02/19/2021.  During that time she presented with her daughter.  She reported that she has been feeling more palpitations.  She indicated that she noted increased heart pounding for about 2 weeks which was painful.  She felt that her episodes were more frequent when they had previously been.  Consultation with the atrial fibrillation clinic and starting propafenone was discussed.  She also reported that she had fallen in her yard  about a month prior.  It appeared to be a fall that was related to loss of balance.  She was asked to follow-up with her PCP and get referral for physical therapy.  Her blood pressures at home have been elevated.  She had been on amlodipine until her prescription ran out and her insurance would no longer cover the medication.  Her amlodipine was resumed.  She did not have a formal exercise routine.  She has been working on her diet eating more fruits as well as vegetables.  She had tried to cut back on her fried food.  She denied bleeding issues and reported compliance with her apixaban.  They were in the process of filling out patient assistance forms.  She was seen by Alphonzo Severance, PA-C on 05/31/2021.  During that time she reported she had some days when she felt increased energy and other days where she felt very tired.  She remained there rate controlled atrial fibrillation.  She was working with her PCP and GI for her ongoing anemia.  She denied any bleeding issues on anticoagulation.  She presents to the clinic today for follow-up evaluation states***  *** denies chest pain, shortness of breath, lower extremity edema, fatigue, palpitations, melena, hematuria, hemoptysis, diaphoresis, weakness, presyncope, syncope, orthopnea, and PND.   Home Medications    Prior to Admission medications   Medication Sig Start Date End Date Taking? Authorizing Provider  acetaminophen (TYLENOL) 500 MG tablet Take 500 mg by mouth every 6 (six) hours as needed for mild pain or moderate pain.  [provider]  apixaban (ELIQUIS) 5 MG TABS tablet Take 1 tablet (5 mg total) by mouth 2 (two) times daily. 05/31/21   Fenton, Clint R, PA  atorvastatin (LIPITOR) 40 MG tablet Take 1 tablet (40 mg total) by mouth daily. 01/15/21   Vivi Barrack, MD  carvedilol (COREG) 12.5 MG tablet Take 1 tablet (12.5 mg total) by mouth 2 (two) times daily with a meal. 02/07/21   Elouise Munroe, MD  diclofenac Sodium (VOLTAREN)  1 % GEL Apply 2 g topically 4 (four) times daily.    [provider]  gabapentin (NEURONTIN) 100 MG capsule Take 1 capsule (100 mg total) by mouth at bedtime. 01/15/21   Vivi Barrack, MD  losartan (COZAAR) 100 MG tablet Take 1 tablet (100 mg total) by mouth daily. 01/15/21   Vivi Barrack, MD  meclizine (ANTIVERT) 25 MG tablet Take 1 tablet (25 mg total) by mouth 3 (three) times daily as needed for dizziness. 01/15/21   Vivi Barrack, MD  polyethylene glycol powder [facility]) 17 GM/SCOOP powder Take 1 g by mouth daily as needed.    [provider]  Vibegron (GEMTESA) 75 MG TABS Take by mouth.    [provider]    Family History    Family History  Problem Relation Age of Onset   Heart disease Mother    Appendicitis Mother    Appendicitis Father    Heart disease Father    Heart disease Maternal Grandmother    She indicated that her mother is deceased. She indicated that her father is deceased. She indicated that her maternal grandmother is deceased.  Social History    Social History   Socioeconomic History   Marital status: Single    Spouse name: Not on file   Number of children: Not on file   Years of education: Not on file   Highest education level: Not on file  Occupational History   Not on file  Tobacco Use   Smoking status: Never   Smokeless tobacco: Never  Substance and Sexual Activity   Alcohol use: No   Drug use: No   Sexual activity: Not on file  Other Topics Concern   Not on file  Social History Narrative   Daughter in [place]   Lives by herself   Worked for [employer] for 20+ years   Social Determinants of Health   Financial Resource Strain: Not on file  Food Insecurity: Not on file  Transportation Needs: Not on file  Physical Activity: Not on file  Stress: Not on file  Social Connections: Not on file  Intimate Partner Violence: Not on file     Review of Systems    General:  No chills, fever, night sweats or  weight changes.  Cardiovascular:  No chest pain, dyspnea on exertion, edema, orthopnea, palpitations, paroxysmal nocturnal dyspnea. Dermatological: No rash, lesions/masses Respiratory: No cough, dyspnea Urologic: No hematuria, dysuria Abdominal:   No nausea, vomiting, diarrhea, bright red blood per rectum, melena, or hematemesis Neurologic:  No visual changes, wkns, changes in mental status. All other systems reviewed and are otherwise negative except as noted above.  Physical Exam    VS:  There were no vitals taken for this visit. , BMI There is no height or weight on file to calculate BMI. GEN: Well nourished, well developed, in no acute distress. HEENT: normal. Neck: Supple, no JVD, carotid bruits, or masses. Cardiac: RRR, no murmurs, rubs, or gallops. No clubbing, cyanosis, edema.  Radials/DP/PT 2+ and equal bilaterally.  Respiratory:  Respirations regular and unlabored, clear to auscultation bilaterally. GI: Soft, nontender, nondistended, BS + x 4. MS: no deformity or atrophy. Skin: warm and dry, no rash. Neuro:  Strength and sensation are intact. Psych: Normal affect.  Accessory Clinical Findings    Recent Labs: 01/15/2021: TSH 4.12 04/16/2021: ALT 13; BUN 22; Creatinine, Ser 1.05; Hemoglobin 10.7; Platelets 229.0; Potassium 3.8; Sodium 143   Recent Lipid Panel    Component Value Date/Time   CHOL 236 (H) 08/08/2020 0935   TRIG 120 08/08/2020 0935   HDL 45 (L) 08/08/2020 0935   CHOLHDL 5.2 (H) 08/08/2020 0935   LDLCALC 166 (H) 08/08/2020 0935    ECG personally reviewed by me today- *** - No acute changes  Echocardiogram 10/05/2020 IMPRESSIONS     1. Left ventricular ejection fraction, by estimation, is 60 to 65%. The  left ventricle has normal function. The left ventricle has no regional  wall motion abnormalities. Left ventricular diastolic function could not  be evaluated.   2. Right ventricular systolic function is normal. The right ventricular  size is mildly  enlarged. There is mildly elevated pulmonary artery  systolic pressure. The estimated right ventricular systolic pressure is  44.0 mmHg.   3. Right atrial size was moderately dilated.   4. The mitral valve is normal in structure. No evidence of mitral valve  regurgitation. No evidence of mitral stenosis.   5. The aortic valve is tricuspid. Aortic valve regurgitation is not  visualized. Mild aortic valve sclerosis is present, with no evidence of  aortic valve stenosis.   6. The inferior vena cava is normal in size with greater than 50%  respiratory variability, suggesting right atrial pressure of 3 mmHg.  Nuclear stress test 10/30/2020 Nuclear stress EF: 63%. There was no ST segment deviation noted during stress. The study is normal. This is a low risk study. The left ventricular ejection fraction is normal (55-65%).   Normal stress nuclear study with no ischemia or infarction.  Gated ejection fraction 63% with normal wall motion.   Assessment & Plan   1.  Persistent atrial fibrillation-heart rate today***.  Continues with to be rate controlled.  Reports compliance with apixaban and denies bleeding issues.  Denies recent episodes of accelerated heartbeat. Continue apixaban, carvedilol Heart healthy low-sodium diet-salty 6 given Increase physical activity as tolerated  Essential hypertension-BP today***.  Well-controlled at home. Continue carvedilol, losartan Heart healthy low-sodium diet-salty 6 given Increase physical activity as tolerated  Hypercholesterolemia-08/08/2020: Cholesterol 236; HDL 45; LDL Cholesterol (Calc) 166; Triglycerides 120 Continue atorvastatin Heart healthy low-sodium diet-salty 6 given Increase physical activity as tolerated Repeat fasting lipids and LFTs  Lower extremity edema-euvolemic today.  Reviewed importance of heart healthy low-sodium diet. Elevate lower extremities when not active Lower extremity support stockings-vascular support stocking sheet  given Increase physical activity as tolerated   Disposition: Follow-up with Dr. Jacques Navy in 6 months.  Thomasene Ripple. Kimaria Struthers NP-C    08/05/2021, 11:17 AM [facility] [address] Office [phone] Fax [phone]  Notice: This dictation was prepared with Dragon dictation along with smaller phrase technology. Any transcriptional errors that result from this process are unintentional and may not be corrected upon review.  I spent***minutes examining this patient, reviewing medications, and using patient centered shared decision making involving her cardiac care.  Prior to her visit I spent greater than 20 minutes reviewing her past medical history,  medications, and prior cardiac tests.

## 2021-08-06 ENCOUNTER — Ambulatory Visit: Payer: Medicare HMO | Admitting: General Practice

## 2021-09-10 ENCOUNTER — Telehealth: Payer: Self-pay | Admitting: Physician Assistant

## 2021-09-10 NOTE — Telephone Encounter (Signed)
Copied from [place] [id number]. Topic: Medicare AWV >> Sep 10, 2021 10:15 AM Harris-Coley, Hannah Beat wrote: Reason for CRM: Left message for patient to schedule Annual Wellness Visit.  Please schedule with Nurse Health Advisor Charlott Rakes, RN at [facility].  Please call [phone] ask for [facility]

## 2021-12-10 ENCOUNTER — Other Ambulatory Visit (HOSPITAL_COMMUNITY): Payer: Self-pay | Admitting: *Deleted

## 2021-12-10 MED ORDER — APIXABAN 5 MG PO TABS: 5.0000 mg | ORAL_TABLET | Freq: Two times a day (BID) | ORAL | 1 refills | Status: DC

## 2021-12-18 DIAGNOSIS — H52223 Regular astigmatism, bilateral: Secondary | ICD-10-CM | POA: Diagnosis not present

## 2021-12-25 ENCOUNTER — Ambulatory Visit (HOSPITAL_COMMUNITY)
Admission: RE | Admit: 2021-12-25 | Discharge: 2021-12-25 | Disposition: A | Discharge status: Home / Self Care | Payer: Medicare HMO | Source: Ambulatory Visit | Attending: Physician Assistant | Admitting: Physician Assistant

## 2021-12-25 VITALS — BP 186/96 | HR 68 | Ht 62.0 in | Wt 254.0 lb

## 2021-12-25 DIAGNOSIS — Z6841 Body Mass Index (BMI) 40.0 and over, adult: Secondary | ICD-10-CM | POA: Diagnosis not present

## 2021-12-25 DIAGNOSIS — I4821 Permanent atrial fibrillation: Secondary | ICD-10-CM | POA: Diagnosis not present

## 2021-12-25 DIAGNOSIS — I5030 Unspecified diastolic (congestive) heart failure: Secondary | ICD-10-CM | POA: Insufficient documentation

## 2021-12-25 DIAGNOSIS — Z7901 Long term (current) use of anticoagulants: Secondary | ICD-10-CM | POA: Insufficient documentation

## 2021-12-25 DIAGNOSIS — E785 Hyperlipidemia, unspecified: Secondary | ICD-10-CM | POA: Insufficient documentation

## 2021-12-25 DIAGNOSIS — D6869 Other thrombophilia: Secondary | ICD-10-CM | POA: Diagnosis not present

## 2021-12-25 DIAGNOSIS — I110 Hypertensive heart disease with heart failure: Secondary | ICD-10-CM | POA: Diagnosis not present

## 2021-12-25 DIAGNOSIS — I483 Typical atrial flutter: Secondary | ICD-10-CM | POA: Insufficient documentation

## 2021-12-25 DIAGNOSIS — Z79899 Other long term (current) drug therapy: Secondary | ICD-10-CM | POA: Diagnosis not present

## 2021-12-25 DIAGNOSIS — E669 Obesity, unspecified: Secondary | ICD-10-CM | POA: Diagnosis not present

## 2021-12-25 LAB — BASIC METABOLIC PANEL WITH GFR
Anion gap: 4 — ABNORMAL LOW (ref 5–15)
BUN: 15 mg/dL (ref 8–23)
CO2: 25 mmol/L (ref 22–32)
Calcium: 8.7 mg/dL — ABNORMAL LOW (ref 8.9–10.3)
Chloride: 111 mmol/L (ref 98–111)
Creatinine, Ser: 0.94 mg/dL (ref 0.44–1.00)
GFR, Estimated: >60 mL/min
Glucose, Bld: 105 mg/dL — ABNORMAL HIGH (ref 70–99)
Potassium: 4.6 mmol/L (ref 3.5–5.1)
Sodium: 140 mmol/L (ref 135–145)

## 2021-12-25 LAB — BRAIN NATRIURETIC PEPTIDE: B Natriuretic Peptide: 646.3 pg/mL — ABNORMAL HIGH (ref 0.0–100.0)

## 2021-12-25 MED ORDER — FUROSEMIDE 20 MG PO TABS: 20.0000 mg | ORAL_TABLET | Freq: Every day | ORAL | 1 refills | Status: DC

## 2021-12-25 NOTE — Progress Notes (Signed)
? ? ?Primary Care Physician: Inda Coke, PA ?Primary Cardiologist: Dr Margaretann Loveless  ?Primary Electrophysiologist: none ?Referring Physician: Dr Margaretann Loveless  ? ? ?Tara Collier is a 76 y.o. female with a history of HTN, HLD, atrial fibrillation, atrial flutter who presents for follow up in the [facility]. The patient was initially diagnosed with atrial fibrillation and atrial flutter remotely and underwent flutter ablation in 2013. She has previously been on propafenone but she was lost to cardiology follow up and discontinued this on her own. Patient is on Eliquis for a CHADS2VASC score of 3.   ? ?On follow up today, patient reports that over the last 3 weeks she has noted more SOB with exertion and orthopnea. She remains in rate controlled afib. No bleeding issues on anticoagulation.  ? ?Today, she denies symptoms of palpitations, chest pain, PND, dizziness, presyncope, syncope, bleeding, or neurologic sequela. The patient is tolerating medications without difficulties and is otherwise without complaint today.  ? ? ?Atrial Fibrillation Risk Factors: ? ?she does have symptoms or diagnosis of sleep apnea. ?She did not follow up for sleep consult.  ?she does not have a history of rheumatic fever. ?she does not have a history of alcohol use. ?The patient does not have a history of early familial atrial fibrillation or other arrhythmias. ? ?she has a BMI of Body mass index is 46.46 kg/m?Marland KitchenMarland Kitchen ?Filed Weights  ? 12/25/21 1533  ?Weight: 115.2 kg  ? ? ? ?Family History  ?Problem Relation Age of Onset  ? Heart disease Mother   ? Appendicitis Mother   ? Appendicitis Father   ? Heart disease Father   ? Heart disease Maternal Grandmother   ? ? ? ?Atrial Fibrillation Management history: ? ?Previous antiarrhythmic drugs: propafenone  ?Previous cardioversions: 2013 ?Previous ablations: 07/19/12 (flutter) ?CHADS2VASC score: 3 ?Anticoagulation history: Eliquis ? ? ?Past Medical History:  ?Diagnosis Date  ?  Arthritis   ? Atrial fibrillation ([place]) 06/03/2013  ? Last Assessment & Plan:  Formatting of this note might be different from the original. Continue propafenone for rhythm control .Patient has clear symptomatic benefit with sinus rhythm maintenance. CHADS-Vasc score over 2, continue anticoagulation with eliquis.  ? HTN (hypertension) 06/03/2013  ? Last Assessment & Plan:  Formatting of this note might be different from the original. Hypertension is well controlled.  Continue current medical regimen and low sodium diet.  Reevaluate at next office visit  ? Hypercholesterolemia   ? Restless leg syndrome   ? Urinary incontinence 08/08/2020  ? ?Past Surgical History:  ?Procedure Laterality Date  ? CARDIOVERSION    ? RADIOFREQUENCY ABLATION    ? ROTATOR CUFF REPAIR    ? TUBAL LIGATION    ? ? ?Current Outpatient Medications  ?Medication Sig Dispense Refill  ? acetaminophen (TYLENOL) 500 MG tablet Take 500 mg by mouth every 6 (six) hours as needed for mild pain or moderate pain.    ? apixaban (ELIQUIS) 5 MG TABS tablet Take 1 tablet (5 mg total) by mouth 2 (two) times daily. 60 tablet 1  ? atorvastatin (LIPITOR) 40 MG tablet Take 1 tablet (40 mg total) by mouth daily. 90 tablet 3  ? carvedilol (COREG) 12.5 MG tablet Take 1 tablet (12.5 mg total) by mouth 2 (two) times daily with a meal. 180 tablet 3  ? diclofenac Sodium (VOLTAREN) 1 % GEL Apply 2 g topically 4 (four) times daily.    ? gabapentin (NEURONTIN) 100 MG capsule Take 1 capsule (100 mg total) by mouth at  bedtime. 90 capsule 3  ? losartan (COZAAR) 100 MG tablet Take 1 tablet (100 mg total) by mouth daily. 90 tablet 3  ? meclizine (ANTIVERT) 25 MG tablet Take 1 tablet (25 mg total) by mouth 3 (three) times daily as needed for dizziness. 30 tablet 3  ? polyethylene glycol powder (GLYCOLAX/MIRALAX) 17 GM/SCOOP powder Take 1 g by mouth daily as needed.    ? Vibegron (GEMTESA) 75 MG TABS Take 75 mg by mouth every morning.    ? ?No current facility-administered  medications for this encounter.  ? ? ?Allergies  ?Allergen Reactions  ? Tape Rash  ? Bee Venom Rash  ? Norvasc [Amlodipine] Swelling  ? ? ?Social History  ? ?Socioeconomic History  ? Marital status: Single  ?  Spouse name: Not on file  ? Number of children: Not on file  ? Years of education: Not on file  ? Highest education level: Not on file  ?Occupational History  ? Not on file  ?Tobacco Use  ? Smoking status: Never  ? Smokeless tobacco: Never  ?Substance and Sexual Activity  ? Alcohol use: No  ? Drug use: No  ? Sexual activity: Not on file  ?Other Topics Concern  ? Not on file  ?Social History Narrative  ? Daughter in [place]  ? Lives by herself  ? Worked for [employer] for 20+ years  ? ?Social Determinants of Health  ? ?Financial Resource Strain: Not on file  ?Food Insecurity: Not on file  ?Transportation Needs: Not on file  ?Physical Activity: Not on file  ?Stress: Not on file  ?Social Connections: Not on file  ?Intimate Partner Violence: Not on file  ? ? ? ?ROS- All systems are reviewed and negative except as per the HPI above. ? ?Physical Exam: ?Vitals:  ? 12/25/21 1533  ?BP: (!) 186/96  ?Pulse: 68  ?SpO2: 97%  ?Weight: 115.2 kg  ?Height: 5\' 2"  (1.575 m)  ? ? ? ?GEN- The patient is a well appearing obese female, alert and oriented x 3 today.   ?HEENT-head normocephalic, atraumatic, sclera clear, conjunctiva pink, hearing intact, trachea midline. ?Lungs- Clear to ausculation bilaterally, normal work of breathing ?Heart- irregular rate and rhythm, no murmurs, rubs or gallops  ?GI- soft, NT, ND, + BS ?Extremities- no clubbing, cyanosis, 1-2+ bilateral edema  ?MS- no significant deformity or atrophy ?Skin- no rash or lesion ?Psych- euthymic mood, full affect ?Neuro- strength and sensation are intact ? ? ?Wt Readings from Last 3 Encounters:  ?12/25/21 115.2 kg  ?05/31/21 114.6 kg  ?04/19/21 112.8 kg  ? ? ?EKG today demonstrates  ?Afib ?Vent. rate 68 BPM ?PR interval * ms ?QRS duration 84 ms ?QT/QTcB 430/457 ms ? ?Echo  10/05/20 demonstrated  ?1. Left ventricular ejection fraction, by estimation, is 60 to 65%. The  ?left ventricle has normal function. The left ventricle has no regional  ?wall motion abnormalities. Left ventricular diastolic function could not be evaluated.  ? 2. Right ventricular systolic function is normal. The right ventricular  ?size is mildly enlarged. There is mildly elevated pulmonary artery  ?systolic pressure. The estimated right ventricular systolic pressure is 123XX123 mmHg.  ? 3. Right atrial size was moderately dilated.  ? 4. The mitral valve is normal in structure. No evidence of mitral valve regurgitation. No evidence of mitral stenosis.  ? 5. The aortic valve is tricuspid. Aortic valve regurgitation is not  ?visualized. Mild aortic valve sclerosis is present, with no evidence of aortic valve stenosis.  ? 6.  The inferior vena cava is normal in size with greater than 50%  ?respiratory variability, suggesting right atrial pressure of 3 mmHg.  ? ?LA 5.8 cm ? ?Epic records are reviewed at length today ? ?CHA2DS2-VASc Score = 3  ?The patient's score is based upon: ?CHF History: 0 ?HTN History: 1 ?Diabetes History: 0 ?Stroke History: 0 ?Vascular Disease History: 0 ?Age Score: 1 ?Gender Score: 1 ?    ? ? ? ?ASSESSMENT AND PLAN: ?1. Permanent atrial fibrillation/typical atrial flutter ?The patient's CHA2DS2-VASc score is 3, indicating a 3.2% annual risk of stroke.   ?Patient in rate controlled afib, likely permanent at this point.  ?Continue Eliquis 5 mg BID ?Continue Coreg 12.5 mg BID ? ?2. Secondary Hypercoagulable State (ICD10:  D68.69) ?The patient is at significant risk for stroke/thromboembolism based upon her CHA2DS2-VASc Score of 3.  Continue Apixaban (Eliquis).  ? ?3. Obesity ?Body mass index is 46.46 kg/m?. ?Lifestyle modification was discussed and encouraged including regular physical activity and weight reduction. ? ?4. Suspected OSA ?Patient did not have sleep study.  ? ?5. HTN ?Elevated today,  starting diuretic as below. ?May need to resume amlodipine.  ? ?6. HFpEF ?Patient having lower extremity swelling, orthopnea, and SOB on exertion. Do not think this is related to afib given she has been out of rhythm f

## 2021-12-25 NOTE — Patient Instructions (Signed)
Start lasix 20mg  once a day ? ?

## 2022-01-02 ENCOUNTER — Telehealth: Payer: Self-pay | Admitting: Physician Assistant

## 2022-01-02 ENCOUNTER — Telehealth: Payer: Self-pay | Admitting: Internal Medicine

## 2022-01-02 NOTE — Telephone Encounter (Signed)
LVM with daughter and patient 3x with no success in scheduling appt in 2 weeks with Dr. Jacques Navy or an APP. Sending patient a letter to call office to schedule appt.  ?

## 2022-01-02 NOTE — Telephone Encounter (Signed)
Copied from [place] [id number]. Topic: Medicare AWV ?>> Jan 02, 2022 10:53 AM Harris-Coley, Hannah Beat wrote: ?Reason for CRM: Left message for patient to schedule Annual Wellness Visit.  Please schedule with Nurse Health Advisor Charlott Rakes, RN at [facility].  Please call [phone] ask for Juliann Pulse ?

## 2022-01-09 ENCOUNTER — Other Ambulatory Visit: Payer: Self-pay | Admitting: Family Medicine

## 2022-01-18 ENCOUNTER — Other Ambulatory Visit (HOSPITAL_COMMUNITY): Payer: Self-pay | Admitting: Physician Assistant

## 2022-01-28 ENCOUNTER — Other Ambulatory Visit: Payer: Self-pay | Admitting: Family Medicine

## 2022-02-04 ENCOUNTER — Other Ambulatory Visit (HOSPITAL_COMMUNITY): Payer: Self-pay | Admitting: *Deleted

## 2022-02-04 MED ORDER — FUROSEMIDE 20 MG PO TABS: 20.0000 mg | ORAL_TABLET | Freq: Every day | ORAL | 1 refills | Status: DC

## 2022-02-07 DIAGNOSIS — N3946 Mixed incontinence: Secondary | ICD-10-CM | POA: Diagnosis not present

## 2022-02-11 ENCOUNTER — Ambulatory Visit: Payer: Medicare HMO | Admitting: Physician Assistant

## 2022-02-11 NOTE — Progress Notes (Incomplete)
Tara Collier is a 76 y.o. female here for a {New prob or follow up:31724}.  SCRIBE STATEMENT  History of Present Illness:   No chief complaint on file.   HPI  Past Medical History:  Diagnosis Date   Arthritis    Atrial fibrillation ([facility]) 06/03/2013   Last Assessment & Plan:  Formatting of this note might be different from the original. Continue propafenone for rhythm control .Patient has clear symptomatic benefit with sinus rhythm maintenance. CHADS-Vasc score over 2, continue anticoagulation with eliquis.   HTN (hypertension) 06/03/2013   Last Assessment & Plan:  Formatting of this note might be different from the original. Hypertension is well controlled.  Continue current medical regimen and low sodium diet.  Reevaluate at next office visit   Hypercholesterolemia    Restless leg syndrome    Urinary incontinence 08/08/2020     Social History   Tobacco Use   Smoking status: Never   Smokeless tobacco: Never  Substance Use Topics   Alcohol use: No   Drug use: No    Past Surgical History:  Procedure Laterality Date   CARDIOVERSION     RADIOFREQUENCY ABLATION     ROTATOR CUFF REPAIR     TUBAL LIGATION      Family History  Problem Relation Age of Onset   Heart disease Mother    Appendicitis Mother    Appendicitis Father    Heart disease Father    Heart disease Maternal Grandmother     Allergies  Allergen Reactions   Tape Rash   Bee Venom Rash   Norvasc [Amlodipine] Swelling    Current Medications:   Current Outpatient Medications:    acetaminophen (TYLENOL) 500 MG tablet, Take 500 mg by mouth every 6 (six) hours as needed for mild pain or moderate pain., Disp: , Rfl:    apixaban (ELIQUIS) 5 MG TABS tablet, Take 1 tablet (5 mg total) by mouth 2 (two) times daily., Disp: 60 tablet, Rfl: 1   atorvastatin (LIPITOR) 40 MG tablet, TAKE 1 TABLET BY MOUTH EVERY DAY, Disp: 90 tablet, Rfl: 3   carvedilol (COREG) 12.5 MG tablet, Take 1 tablet (12.5 mg total) by mouth 2  (two) times daily with a meal., Disp: 180 tablet, Rfl: 3   diclofenac Sodium (VOLTAREN) 1 % GEL, Apply 2 g topically 4 (four) times daily., Disp: , Rfl:    furosemide (LASIX) 20 MG tablet, Take 1 tablet (20 mg total) by mouth daily., Disp: 90 tablet, Rfl: 1   gabapentin (NEURONTIN) 100 MG capsule, Take 1 capsule (100 mg total) by mouth at bedtime., Disp: 90 capsule, Rfl: 3   losartan (COZAAR) 100 MG tablet, TAKE 1 TABLET BY MOUTH EVERY DAY, Disp: 90 tablet, Rfl: 1   meclizine (ANTIVERT) 25 MG tablet, Take 1 tablet (25 mg total) by mouth 3 (three) times daily as needed for dizziness., Disp: 30 tablet, Rfl: 3   polyethylene glycol powder (GLYCOLAX/MIRALAX) 17 GM/SCOOP powder, Take 1 g by mouth daily as needed., Disp: , Rfl:    Vibegron (GEMTESA) 75 MG TABS, Take 75 mg by mouth every morning., Disp: , Rfl:    Review of Systems:   ROS Negative unless otherwise specified per HPI.   Vitals:   There were no vitals filed for this visit.   There is no height or weight on file to calculate BMI.  Physical Exam:   Physical Exam  Assessment and Plan:   @DIAGLIST @    I,Tara Collier,acting as a scribe for , PA.,have  documented all relevant documentation on the behalf of Jarold Motto, PA,as directed by  Jarold Motto, PA while in the presence of [place], [place].   ***  Jarold Motto, [place]

## 2022-02-18 ENCOUNTER — Other Ambulatory Visit: Payer: Self-pay

## 2022-02-18 ENCOUNTER — Encounter (HOSPITAL_COMMUNITY): Payer: Self-pay

## 2022-02-18 ENCOUNTER — Observation Stay (HOSPITAL_COMMUNITY): Payer: Medicare HMO

## 2022-02-18 ENCOUNTER — Encounter: Payer: Self-pay | Admitting: Physician Assistant

## 2022-02-18 ENCOUNTER — Inpatient Hospital Stay (HOSPITAL_COMMUNITY)
Admission: EM | Admit: 2022-02-18 | Discharge: 2022-02-20 | DRG: 377 | Disposition: A | Discharge status: Home / Self Care | Payer: Medicare HMO | Attending: Internal Medicine | Admitting: Internal Medicine

## 2022-02-18 ENCOUNTER — Ambulatory Visit (INDEPENDENT_AMBULATORY_CARE_PROVIDER_SITE_OTHER): Payer: Medicare HMO | Admitting: Physician Assistant

## 2022-02-18 ENCOUNTER — Emergency Department (HOSPITAL_COMMUNITY): Payer: Medicare HMO

## 2022-02-18 ENCOUNTER — Telehealth: Payer: Self-pay

## 2022-02-18 VITALS — BP 130/80 | HR 82 | Temp 98.3°F | Ht 62.0 in | Wt 249.5 lb

## 2022-02-18 DIAGNOSIS — M1990 Unspecified osteoarthritis, unspecified site: Secondary | ICD-10-CM | POA: Diagnosis not present

## 2022-02-18 DIAGNOSIS — N858 Other specified noninflammatory disorders of uterus: Secondary | ICD-10-CM | POA: Diagnosis not present

## 2022-02-18 DIAGNOSIS — R7989 Other specified abnormal findings of blood chemistry: Secondary | ICD-10-CM | POA: Diagnosis present

## 2022-02-18 DIAGNOSIS — Z888 Allergy status to other drugs, medicaments and biological substances status: Secondary | ICD-10-CM

## 2022-02-18 DIAGNOSIS — E7800 Pure hypercholesterolemia, unspecified: Secondary | ICD-10-CM | POA: Diagnosis present

## 2022-02-18 DIAGNOSIS — R0602 Shortness of breath: Secondary | ICD-10-CM

## 2022-02-18 DIAGNOSIS — I110 Hypertensive heart disease with heart failure: Secondary | ICD-10-CM | POA: Diagnosis present

## 2022-02-18 DIAGNOSIS — Z800 Family history of malignant neoplasm of digestive organs: Secondary | ICD-10-CM | POA: Diagnosis not present

## 2022-02-18 DIAGNOSIS — Z7901 Long term (current) use of anticoagulants: Secondary | ICD-10-CM | POA: Diagnosis not present

## 2022-02-18 DIAGNOSIS — R531 Weakness: Secondary | ICD-10-CM

## 2022-02-18 DIAGNOSIS — I4891 Unspecified atrial fibrillation: Secondary | ICD-10-CM | POA: Diagnosis not present

## 2022-02-18 DIAGNOSIS — N939 Abnormal uterine and vaginal bleeding, unspecified: Secondary | ICD-10-CM | POA: Diagnosis not present

## 2022-02-18 DIAGNOSIS — I10 Essential (primary) hypertension: Secondary | ICD-10-CM | POA: Diagnosis not present

## 2022-02-18 DIAGNOSIS — K5909 Other constipation: Secondary | ICD-10-CM | POA: Diagnosis present

## 2022-02-18 DIAGNOSIS — R160 Hepatomegaly, not elsewhere classified: Secondary | ICD-10-CM | POA: Diagnosis present

## 2022-02-18 DIAGNOSIS — I5033 Acute on chronic diastolic (congestive) heart failure: Secondary | ICD-10-CM | POA: Diagnosis present

## 2022-02-18 DIAGNOSIS — I509 Heart failure, unspecified: Secondary | ICD-10-CM

## 2022-02-18 DIAGNOSIS — G2581 Restless legs syndrome: Secondary | ICD-10-CM | POA: Diagnosis present

## 2022-02-18 DIAGNOSIS — K449 Diaphragmatic hernia without obstruction or gangrene: Secondary | ICD-10-CM | POA: Diagnosis present

## 2022-02-18 DIAGNOSIS — Z79899 Other long term (current) drug therapy: Secondary | ICD-10-CM | POA: Diagnosis not present

## 2022-02-18 DIAGNOSIS — D649 Anemia, unspecified: Secondary | ICD-10-CM | POA: Diagnosis present

## 2022-02-18 DIAGNOSIS — K648 Other hemorrhoids: Secondary | ICD-10-CM | POA: Diagnosis present

## 2022-02-18 DIAGNOSIS — Z801 Family history of malignant neoplasm of trachea, bronchus and lung: Secondary | ICD-10-CM

## 2022-02-18 DIAGNOSIS — D509 Iron deficiency anemia, unspecified: Secondary | ICD-10-CM | POA: Diagnosis present

## 2022-02-18 DIAGNOSIS — Z6841 Body Mass Index (BMI) 40.0 and over, adult: Secondary | ICD-10-CM | POA: Diagnosis not present

## 2022-02-18 DIAGNOSIS — K31811 Angiodysplasia of stomach and duodenum with bleeding: Principal | ICD-10-CM

## 2022-02-18 DIAGNOSIS — R5383 Other fatigue: Secondary | ICD-10-CM | POA: Diagnosis present

## 2022-02-18 DIAGNOSIS — K649 Unspecified hemorrhoids: Secondary | ICD-10-CM | POA: Diagnosis not present

## 2022-02-18 DIAGNOSIS — N938 Other specified abnormal uterine and vaginal bleeding: Secondary | ICD-10-CM | POA: Diagnosis present

## 2022-02-18 DIAGNOSIS — N950 Postmenopausal bleeding: Secondary | ICD-10-CM | POA: Diagnosis not present

## 2022-02-18 DIAGNOSIS — Z780 Asymptomatic menopausal state: Secondary | ICD-10-CM | POA: Diagnosis not present

## 2022-02-18 DIAGNOSIS — Z91030 Bee allergy status: Secondary | ICD-10-CM

## 2022-02-18 DIAGNOSIS — I4821 Permanent atrial fibrillation: Secondary | ICD-10-CM | POA: Diagnosis present

## 2022-02-18 DIAGNOSIS — K31819 Angiodysplasia of stomach and duodenum without bleeding: Secondary | ICD-10-CM | POA: Diagnosis not present

## 2022-02-18 DIAGNOSIS — E876 Hypokalemia: Secondary | ICD-10-CM | POA: Diagnosis present

## 2022-02-18 DIAGNOSIS — Z8249 Family history of ischemic heart disease and other diseases of the circulatory system: Secondary | ICD-10-CM | POA: Diagnosis not present

## 2022-02-18 DIAGNOSIS — R0609 Other forms of dyspnea: Secondary | ICD-10-CM | POA: Diagnosis not present

## 2022-02-18 DIAGNOSIS — K644 Residual hemorrhoidal skin tags: Secondary | ICD-10-CM | POA: Diagnosis present

## 2022-02-18 DIAGNOSIS — Z91199 Patient's noncompliance with other medical treatment and regimen due to unspecified reason: Secondary | ICD-10-CM

## 2022-02-18 LAB — CBC
HCT: 27.3 % — ABNORMAL LOW (ref 36.0–46.0)
Hemoglobin: 7.8 g/dL — ABNORMAL LOW (ref 12.0–15.0)
MCH: 22.9 pg — ABNORMAL LOW (ref 26.0–34.0)
MCHC: 28.6 g/dL — ABNORMAL LOW (ref 30.0–36.0)
MCV: 80.3 fL (ref 80.0–100.0)
Platelets: 258 10*3/uL (ref 150–400)
RBC: 3.4 MIL/uL — ABNORMAL LOW (ref 3.87–5.11)
RDW: 18.6 % — ABNORMAL HIGH (ref 11.5–15.5)
WBC: 6.2 10*3/uL (ref 4.0–10.5)
nRBC: 0 % (ref 0.0–0.2)

## 2022-02-18 LAB — TROPONIN I (HIGH SENSITIVITY)
Troponin I (High Sensitivity): 11 ng/L
Troponin I (High Sensitivity): 12 ng/L (ref <18)

## 2022-02-18 LAB — I-STAT VENOUS BLOOD GAS, ED
Acid-Base Excess: 2 mmol/L (ref 0.0–2.0)
Bicarbonate: 26.9 mmol/L (ref 20.0–28.0)
Calcium, Ion: 1.11 mmol/L — ABNORMAL LOW (ref 1.15–1.40)
HCT: 25 % — ABNORMAL LOW (ref 36.0–46.0)
Hemoglobin: 8.5 g/dL — ABNORMAL LOW (ref 12.0–15.0)
O2 Saturation: 80 %
Potassium: 3.7 mmol/L (ref 3.5–5.1)
Sodium: 140 mmol/L (ref 135–145)
TCO2: 28 mmol/L (ref 22–32)
pCO2, Ven: 39.7 mmHg — ABNORMAL LOW (ref 44–60)
pH, Ven: 7.438 — ABNORMAL HIGH (ref 7.25–7.43)
pO2, Ven: 43 mmHg (ref 32–45)

## 2022-02-18 LAB — URINALYSIS, ROUTINE W REFLEX MICROSCOPIC
Bilirubin Urine: NEGATIVE
Glucose, UA: NEGATIVE mg/dL
Hgb urine dipstick: NEGATIVE
Ketones, ur: NEGATIVE mg/dL
Leukocytes,Ua: NEGATIVE
Nitrite: NEGATIVE
Protein, ur: NEGATIVE mg/dL
Specific Gravity, Urine: 1.008 (ref 1.005–1.030)
pH: 6 (ref 5.0–8.0)

## 2022-02-18 LAB — COMPREHENSIVE METABOLIC PANEL WITH GFR
ALT: 27 U/L (ref 0–44)
AST: 31 U/L (ref 15–41)
Albumin: 3.5 g/dL (ref 3.5–5.0)
Alkaline Phosphatase: 119 U/L (ref 38–126)
Anion gap: 10 (ref 5–15)
BUN: 29 mg/dL — ABNORMAL HIGH (ref 8–23)
CO2: 24 mmol/L (ref 22–32)
Calcium: 8.6 mg/dL — ABNORMAL LOW (ref 8.9–10.3)
Chloride: 104 mmol/L (ref 98–111)
Creatinine, Ser: 1 mg/dL (ref 0.44–1.00)
GFR, Estimated: 59 mL/min — ABNORMAL LOW
Glucose, Bld: 119 mg/dL — ABNORMAL HIGH (ref 70–99)
Potassium: 3.9 mmol/L (ref 3.5–5.1)
Sodium: 138 mmol/L (ref 135–145)
Total Bilirubin: 1.2 mg/dL (ref 0.3–1.2)
Total Protein: 6.1 g/dL — ABNORMAL LOW (ref 6.5–8.1)

## 2022-02-18 LAB — CBC WITH DIFFERENTIAL/PLATELET
Basophils Absolute: 0 10*3/uL (ref 0.0–0.1)
Basophils Relative: 0.9 % (ref 0.0–3.0)
Eosinophils Absolute: 0.2 10*3/uL (ref 0.0–0.7)
Eosinophils Relative: 3.6 % (ref 0.0–5.0)
HCT: 25.4 % — ABNORMAL LOW (ref 36.0–46.0)
Hemoglobin: 7.6 g/dL — CL (ref 12.0–15.0)
Lymphocytes Relative: 14 % (ref 12.0–46.0)
Lymphs Abs: 0.7 10*3/uL (ref 0.7–4.0)
MCHC: 30 g/dL (ref 30.0–36.0)
MCV: 74 fl — ABNORMAL LOW (ref 78.0–100.0)
Monocytes Absolute: 0.5 10*3/uL (ref 0.1–1.0)
Monocytes Relative: 11 % (ref 3.0–12.0)
Neutro Abs: 3.3 10*3/uL (ref 1.4–7.7)
Neutrophils Relative %: 70.5 % (ref 43.0–77.0)
Platelets: 224 10*3/uL (ref 150.0–400.0)
RBC: 3.44 Mil/uL — ABNORMAL LOW (ref 3.87–5.11)
RDW: 20.1 % — ABNORMAL HIGH (ref 11.5–15.5)
WBC: 4.6 10*3/uL (ref 4.0–10.5)

## 2022-02-18 LAB — IRON AND TIBC
Iron: 38 ug/dL (ref 28–170)
Saturation Ratios: 7 % — ABNORMAL LOW (ref 10.4–31.8)
TIBC: 517 ug/dL — ABNORMAL HIGH (ref 250–450)
UIBC: 479 ug/dL

## 2022-02-18 LAB — ABO/RH: ABO/RH(D): O NEG

## 2022-02-18 LAB — POC OCCULT BLOOD, ED: Fecal Occult Bld: NEGATIVE

## 2022-02-18 LAB — BRAIN NATRIURETIC PEPTIDE: B Natriuretic Peptide: 359.5 pg/mL — ABNORMAL HIGH (ref 0.0–100.0)

## 2022-02-18 LAB — PREPARE RBC (CROSSMATCH): Order Confirmation: POSITIVE

## 2022-02-18 MED ORDER — ATORVASTATIN CALCIUM 40 MG PO TABS
40.0000 mg | ORAL_TABLET | Freq: Every day | ORAL | Status: DC
  Administered 2022-02-19 – 2022-02-20 (×2): 40 mg via ORAL
  Filled 2022-02-18 (×2): qty 1

## 2022-02-18 MED ORDER — POLYETHYLENE GLYCOL 3350 17 G PO PACK
17.0000 g | PACK | Freq: Every day | ORAL | Status: DC | PRN
Start: 2022-02-18 — End: 2022-02-19

## 2022-02-18 MED ORDER — CARVEDILOL 12.5 MG PO TABS
12.5000 mg | ORAL_TABLET | Freq: Two times a day (BID) | ORAL | Status: DC
  Administered 2022-02-19 – 2022-02-20 (×2): 12.5 mg via ORAL
  Filled 2022-02-18: qty 4
  Filled 2022-02-18: qty 1

## 2022-02-18 MED ORDER — GABAPENTIN 100 MG PO CAPS
100.0000 mg | ORAL_CAPSULE | Freq: Every day | ORAL | Status: DC
  Administered 2022-02-19: 100 mg via ORAL
  Filled 2022-02-18: qty 1

## 2022-02-18 MED ORDER — LOSARTAN POTASSIUM 50 MG PO TABS
100.0000 mg | ORAL_TABLET | Freq: Every day | ORAL | Status: DC
  Administered 2022-02-19 – 2022-02-20 (×2): 100 mg via ORAL
  Filled 2022-02-18 (×2): qty 2

## 2022-02-18 MED ORDER — ONDANSETRON HCL 4 MG PO TABS: 4.0000 mg | ORAL_TABLET | Freq: Four times a day (QID) | ORAL | Status: DC | PRN

## 2022-02-18 MED ORDER — ACETAMINOPHEN 325 MG PO TABS
650.0000 mg | ORAL_TABLET | Freq: Four times a day (QID) | ORAL | Status: DC | PRN
  Filled 2022-02-18: qty 2

## 2022-02-18 MED ORDER — FUROSEMIDE 10 MG/ML IJ SOLN
20.0000 mg | Freq: Once | INTRAMUSCULAR | Status: AC
  Administered 2022-02-18: 20 mg via INTRAVENOUS
  Filled 2022-02-18: qty 2

## 2022-02-18 MED ORDER — DICLOFENAC SODIUM 1 % EX GEL
2.0000 g | Freq: Four times a day (QID) | CUTANEOUS | Status: DC | PRN
Start: 2022-02-18 — End: 2022-02-20

## 2022-02-18 MED ORDER — ACETAMINOPHEN 650 MG RE SUPP: 650.0000 mg | Freq: Four times a day (QID) | RECTAL | Status: DC | PRN

## 2022-02-18 MED ORDER — SENNA 8.6 MG PO TABS: 2.0000 | ORAL_TABLET | Freq: Every evening | ORAL | Status: DC | PRN

## 2022-02-18 MED ORDER — FUROSEMIDE 10 MG/ML IJ SOLN
20.0000 mg | Freq: Two times a day (BID) | INTRAMUSCULAR | Status: DC
  Administered 2022-02-19 – 2022-02-20 (×3): 20 mg via INTRAVENOUS
  Filled 2022-02-18 (×3): qty 2

## 2022-02-18 MED ORDER — SODIUM CHLORIDE 0.9% FLUSH
3.0000 mL | Freq: Two times a day (BID) | INTRAVENOUS | Status: DC
  Administered 2022-02-19 – 2022-02-20 (×3): 3 mL via INTRAVENOUS

## 2022-02-18 MED ORDER — SODIUM CHLORIDE 0.9% IV SOLUTION: Freq: Once | INTRAVENOUS | Status: DC

## 2022-02-18 MED ORDER — MECLIZINE HCL 25 MG PO TABS
25.0000 mg | ORAL_TABLET | Freq: Three times a day (TID) | ORAL | Status: DC | PRN
Start: 2022-02-18 — End: 2022-02-20

## 2022-02-18 MED ORDER — ONDANSETRON HCL 4 MG/2ML IJ SOLN: 4.0000 mg | Freq: Four times a day (QID) | INTRAMUSCULAR | Status: DC | PRN

## 2022-02-18 NOTE — ED Provider Notes (Signed)
[facility] EMERGENCY DEPARTMENT Provider Note  CSN: [id number] Arrival date & time: 02/18/22 1513  Chief Complaint(s) Abnormal Lab  HPI Tara Collier is a 76 y.o. female with PMH atrial fibrillation on Eliquis, HTN, HLD, anemia who presents emergency department for evaluation of anemia.  Patient previously was seen in the outpatient gastroenterology clinic by Dr. Tomasa Rand for possible colonoscopy work-up after dropping her hemoglobin from 11 to 9.9, but in the office this improved to 10.7 so colonoscopy was not performed.  History obtained from patient's daughter who states that over the last few weeks she has noticed a significant decrease in the patient's ability to do her ADLs secondary to shortness of breath.  She was seen by her primary care physician today who found the patient have a hemoglobin of 7.6 and sent the patient to the emergency room for evaluation.  Patient denies hematochezia, darker stools, persistent vomiting or other systemic symptoms.  Denies chest pain but does endorse worsening lower extremity edema and orthopnea.  She is currently on oral Lasix.   Past Medical History Past Medical History:  Diagnosis Date   Arthritis    Atrial fibrillation (HCC) 06/03/2013   Last Assessment & Plan:  Formatting of this note might be different from the original. Continue propafenone for rhythm control .Patient has clear symptomatic benefit with sinus rhythm maintenance. CHADS-Vasc score over 2, continue anticoagulation with eliquis.   HTN (hypertension) 06/03/2013   Last Assessment & Plan:  Formatting of this note might be different from the original. Hypertension is well controlled.  Continue current medical regimen and low sodium diet.  Reevaluate at next office visit   Hypercholesterolemia    Restless leg syndrome    Urinary incontinence 08/08/2020   Patient Active Problem List   Diagnosis Date Noted   Acute on chronic anemia 02/18/2022   Secondary  hypercoagulable state (HCC) 10/15/2020   Urinary incontinence 08/08/2020   Arthritis    Hypercholesterolemia    Restless leg syndrome    Atrial fibrillation (HCC) 06/03/2013   HTN (hypertension) 06/03/2013   Home Medication(s) Prior to Admission medications   Medication Sig Start Date End Date Taking? Authorizing Provider  acetaminophen (TYLENOL) 325 MG tablet Take 650 mg by mouth every 6 (six) hours as needed for moderate pain or headache.   Yes [provider]  acetaminophen (TYLENOL) 500 MG tablet Take 500 mg by mouth every 6 (six) hours as needed for mild pain or moderate pain.   Yes [provider]  apixaban (ELIQUIS) 5 MG TABS tablet Take 1 tablet (5 mg total) by mouth 2 (two) times daily. 12/10/21  Yes Fenton, Clint R, PA  atorvastatin (LIPITOR) 40 MG tablet TAKE 1 TABLET BY MOUTH EVERY DAY Patient taking differently: Take 40 mg by mouth daily. 01/09/22  Yes Ardith Dark, MD  carvedilol (COREG) 12.5 MG tablet Take 1 tablet (12.5 mg total) by mouth 2 (two) times daily with a meal. 02/07/21  Yes Parke Poisson, MD  cephALEXin (KEFLEX) 500 MG capsule Take 500 mg by mouth See admin instructions. Tid x 7 days 02/10/22  Yes [provider]  diclofenac Sodium (VOLTAREN) 1 % GEL Apply 2 g topically 4 (four) times daily as needed (knee pain).   Yes [provider]  furosemide (LASIX) 20 MG tablet Take 1 tablet (20 mg total) by mouth daily. 02/04/22  Yes Fenton, Clint R, PA  gabapentin (NEURONTIN) 100 MG capsule Take 1 capsule (100 mg total) by mouth at bedtime. 01/15/21  Yes Ardith Dark, MD  losartan (COZAAR) 100 MG tablet TAKE 1 TABLET BY MOUTH EVERY DAY Patient taking differently: Take 100 mg by mouth daily. 01/28/22  Yes Jarold Motto, PA  meclizine (ANTIVERT) 25 MG tablet Take 1 tablet (25 mg total) by mouth 3 (three) times daily as needed for dizziness. 01/15/21  Yes Ardith Dark, MD  MIRABEGRON ER PO Take 1 tablet by mouth daily. Pt given  samples at doctors office unsure of dose   Yes [provider]  polyethylene glycol powder (GLYCOLAX/MIRALAX) 17 GM/SCOOP powder Take 17 g by mouth daily as needed for mild constipation.   Yes [provider]  senna (SENOKOT) 8.6 MG TABS tablet Take 2 tablets by mouth at bedtime as needed for mild constipation.   Yes [provider]                                                                                                                                    Past Surgical History Past Surgical History:  Procedure Laterality Date   CARDIOVERSION     RADIOFREQUENCY ABLATION     ROTATOR CUFF REPAIR     TUBAL LIGATION     Family History Family History  Problem Relation Age of Onset   Heart disease Mother    Appendicitis Mother    Appendicitis Father    Heart disease Father    Heart disease Maternal Grandmother     Social History Social History   Tobacco Use   Smoking status: Never   Smokeless tobacco: Never  Substance Use Topics   Alcohol use: No   Drug use: No   Allergies Tape, Bee venom, and Norvasc [amlodipine]  Review of Systems Review of Systems  Respiratory:  Positive for shortness of breath.   Cardiovascular:  Positive for leg swelling.    Physical Exam Vital Signs  I have reviewed the triage vital signs BP (!) 169/51   Pulse 63   Temp 98.6 F (37 C) (Oral)   Resp 18   Ht 5\' 2"  (1.575 m)   Wt 112.9 kg   SpO2 100%   BMI 45.54 kg/m   Physical Exam Vitals and nursing note reviewed.  Constitutional:      General: She is not in acute distress.    Appearance: She is well-developed.  HENT:     Head: Normocephalic and atraumatic.  Eyes:     Conjunctiva/sclera: Conjunctivae normal.  Cardiovascular:     Rate and Rhythm: Normal rate and regular rhythm.     Heart sounds: No murmur heard. Pulmonary:     Effort: Pulmonary effort is normal. No respiratory distress.     Breath sounds: Normal breath sounds.  Abdominal:      Palpations: Abdomen is soft.     Tenderness: There is no abdominal tenderness.  Genitourinary:    Rectum: Guaiac result negative.     Comments: External hemorrhoid Musculoskeletal:  General: No swelling.     Cervical back: Neck supple.     Right lower leg: Edema present.     Left lower leg: Edema present.  Skin:    General: Skin is warm and dry.     Capillary Refill: Capillary refill takes less than 2 seconds.  Neurological:     Mental Status: She is alert.  Psychiatric:        Mood and Affect: Mood normal.     ED Results and Treatments Labs (all labs ordered are listed, but only abnormal results are displayed) Labs Reviewed  COMPREHENSIVE METABOLIC PANEL - Abnormal; Notable for the following components:      Result Value   Glucose, Bld 119 (*)    BUN 29 (*)    Calcium 8.6 (*)    Total Protein 6.1 (*)    GFR, Estimated 59 (*)    All other components within normal limits  CBC - Abnormal; Notable for the following components:   RBC 3.40 (*)    Hemoglobin 7.8 (*)    HCT 27.3 (*)    MCH 22.9 (*)    MCHC 28.6 (*)    RDW 18.6 (*)    All other components within normal limits  BRAIN NATRIURETIC PEPTIDE - Abnormal; Notable for the following components:   B Natriuretic Peptide 359.5 (*)    All other components within normal limits  IRON AND TIBC - Abnormal; Notable for the following components:   TIBC 517 (*)    Saturation Ratios 7 (*)    All other components within normal limits  I-STAT VENOUS BLOOD GAS, ED - Abnormal; Notable for the following components:   pH, Ven 7.438 (*)    pCO2, Ven 39.7 (*)    Calcium, Ion 1.11 (*)    HCT 25.0 (*)    Hemoglobin 8.5 (*)    All other components within normal limits  PROTIME-INR  APTT  BLOOD GAS, VENOUS  URINALYSIS, ROUTINE W REFLEX MICROSCOPIC  POC OCCULT BLOOD, ED  TYPE AND SCREEN  ABO/RH  TROPONIN I (HIGH SENSITIVITY)  TROPONIN I (HIGH SENSITIVITY)                                                                                                                           Radiology DG Chest 2 View  Result Date: 02/18/2022 CLINICAL DATA:  Shortness of breath EXAM: CHEST - 2 VIEW COMPARISON:  04/12/2021 FINDINGS: Cardiomegaly, vascular congestion. No overt edema. No confluent opacities or effusions. No acute bony abnormality. IMPRESSION: Cardiomegaly, vascular congestion. Electronically Signed   By: Charlett Nose M.D.   On: 02/18/2022 18:11    Pertinent labs & imaging results that were available during my care of the patient were reviewed by me and considered in my medical decision making (see MDM for details).  Medications Ordered in ED Medications  furosemide (LASIX) injection 20 mg (20 mg Intravenous Given 02/18/22 2023)  Procedures Procedures  (including critical care time)  Medical Decision Making / ED Course   This patient presents to the ED for concern of shortness of breath, anemia, this involves an extensive number of treatment options, and is a complaint that carries with it a high risk of complications and morbidity.  The differential diagnosis includes CHF exacerbation, failure of outpatient diuretic regimen, COPD exacerbation, symptomatic anemia, GI bleed  MDM: Patient seen emergency room for evaluation of shortness of breath.  Physical exam with bilateral lower extremity pitting edema and rales in the lower lobes but is otherwise unremarkable.  Rectal exam with an external hemorrhoid but patient is guaiac negative.  Laboratory evaluation with a hemoglobin of 7.8 with an MCV of 80.3, BNP elevated to 359.5, troponin negative, pH 7.43 with no hypercarbia.  Chest x-ray with pulmonary vascular congestion and cardiomegaly.  IV Lasix initiated and GI was consulted given previous GI notes stating that the patient will likely require colonoscopy if she drops her hemoglobin again.   I spoke with Dr. Adela Lank who states that if she is not to be admitted for anything else she would be safe for discharge with close outpatient follow-up for colonoscopy and he will help arrange this.  However, my reevaluation, patient continues to be short of breath with exertion and will require hospital admission for diuresis.  Patient then admitted to the hospital service for diuresis.   Additional history obtained: -Additional history obtained from daughter -External records from outside source obtained and reviewed including: Chart review including previous notes, labs, imaging, consultation notes   Lab Tests: -I ordered, reviewed, and interpreted labs.   The pertinent results include:   Labs Reviewed  COMPREHENSIVE METABOLIC PANEL - Abnormal; Notable for the following components:      Result Value   Glucose, Bld 119 (*)    BUN 29 (*)    Calcium 8.6 (*)    Total Protein 6.1 (*)    GFR, Estimated 59 (*)    All other components within normal limits  CBC - Abnormal; Notable for the following components:   RBC 3.40 (*)    Hemoglobin 7.8 (*)    HCT 27.3 (*)    MCH 22.9 (*)    MCHC 28.6 (*)    RDW 18.6 (*)    All other components within normal limits  BRAIN NATRIURETIC PEPTIDE - Abnormal; Notable for the following components:   B Natriuretic Peptide 359.5 (*)    All other components within normal limits  IRON AND TIBC - Abnormal; Notable for the following components:   TIBC 517 (*)    Saturation Ratios 7 (*)    All other components within normal limits  I-STAT VENOUS BLOOD GAS, ED - Abnormal; Notable for the following components:   pH, Ven 7.438 (*)    pCO2, Ven 39.7 (*)    Calcium, Ion 1.11 (*)    HCT 25.0 (*)    Hemoglobin 8.5 (*)    All other components within normal limits  PROTIME-INR  APTT  BLOOD GAS, VENOUS  URINALYSIS, ROUTINE W REFLEX MICROSCOPIC  POC OCCULT BLOOD, ED  TYPE AND SCREEN  ABO/RH  TROPONIN I (HIGH SENSITIVITY)  TROPONIN I (HIGH SENSITIVITY)       EKG   EKG Interpretation  Date/Time:  Tuesday February 18 2022 15:19:55 EDT Ventricular Rate:  76 PR Interval:    QRS Duration: 78 QT Interval:  392 QTC Calculation: 441 R Axis:   39 Text Interpretation: Atrial fibrillation Abnormal ECG When compared  with ECG of 25-Dec-2021 15:53, PREVIOUS ECG IS PRESENT Confirmed by Crystallynn Noorani ([id number]) on 02/18/2022 5:38:30 PM         Imaging Studies ordered: I ordered imaging studies including chest x-ray I independently visualized and interpreted imaging. I agree with the radiologist interpretation   Medicines ordered and prescription drug management: Meds ordered this encounter  Medications   furosemide (LASIX) injection 20 mg    -I have reviewed the patients home medicines and have made adjustments as needed  Critical interventions none  Consultations Obtained: I requested consultation with the gastroenterologist,  and discussed lab and imaging findings as well as pertinent plan - they recommend: Outpatient follow-up for colonoscopy versus inpatient CBC trending   Cardiac Monitoring: The patient was maintained on a cardiac monitor.  I personally viewed and interpreted the cardiac monitored which showed an underlying rhythm of: Atrial fibrillation  Social Determinants of Health:  Factors impacting patients care include: none   Reevaluation: After the interventions noted above, I reevaluated the patient and found that they have :improved  Co morbidities that complicate the patient evaluation  Past Medical History:  Diagnosis Date   Arthritis    Atrial fibrillation (HCC) 06/03/2013   Last Assessment & Plan:  Formatting of this note might be different from the original. Continue propafenone for rhythm control .Patient has clear symptomatic benefit with sinus rhythm maintenance. CHADS-Vasc score over 2, continue anticoagulation with eliquis.   HTN (hypertension) 06/03/2013   Last Assessment & Plan:  Formatting of this note  might be different from the original. Hypertension is well controlled.  Continue current medical regimen and low sodium diet.  Reevaluate at next office visit   Hypercholesterolemia    Restless leg syndrome    Urinary incontinence 08/08/2020      Dispostion: I considered admission for this patient, and given persistent shortness of breath and fluid overload, patient will require admission.     Final Clinical Impression(s) / ED Diagnoses Final diagnoses:  Symptomatic anemia  Acute on chronic congestive heart failure, unspecified heart failure type [facility])     @PCDICTATION @    Glendora Score, MD 02/18/22 2219

## 2022-02-18 NOTE — Telephone Encounter (Signed)
Patient and daughter sounded hesitant, but did verbally agree to go to ER

## 2022-02-18 NOTE — Telephone Encounter (Signed)
CRITICAL LAB  Hemoglobin @ 7.6

## 2022-02-18 NOTE — ED Triage Notes (Signed)
Patient went to PCP and was called to come back due to hgb 7.6.  Patient reports seeing a little blood on tissue the other day when wiping but nothing since. Reports takes eliquis for afib.

## 2022-02-18 NOTE — H&P (Signed)
History and Physical    Tara Collier ERD:[id number] DOB: 1945/12/04 DOA: 02/18/2022  PCP: Jarold Motto, PA   Patient coming from: Home   Chief Complaint: SOB, fatigue, low Hgb   HPI: Tara Collier is a pleasant 76 y.o. female with medical history significant for atrial fibrillation on Eliquis, chronic diastolic CHF, hypertension, now presenting to the emergency department with shortness of breath, fatigue, and low hemoglobin on outpatient blood work.  Patient reports that she had severe edema and orthopnea in early May 2023, was started on Lasix at that time and improved, but has since experienced insidiously worsening exertional dyspnea with fatigue and exercise intolerance.  She does have some bilateral lower extremity swelling again as well as orthopnea, though not nearly as bad as in early May.  She saw a tiny amount of blood on the toilet paper when she wiped a few days ago after being constipated but denies melena or hematochezia.  She has had episodes of vaginal bleeding recurring for at least several months, but none in the past few days.  She has never had the vaginal bleeding evaluated.  She denies abdominal pain or vomiting.  She was seen by her PCP for shortness of breath, went for blood work, was notified that her hemoglobin was 7.6, and she was directed to the ED.  ED Course: Upon arrival to the ED, patient is found to be afebrile and saturating well on room air with normal heart rate and stable blood pressure.  EKG features atrial fibrillation and chest x-ray notable for cardiomegaly and vascular congestion.  Chemistry panel with BUN 29 and creatinine 1.0.  CBC notable for hemoglobin 7.8.  BNP is 360 and troponin normal.  Type and screen was performed and patient was given 20 mg IV Lasix in the ED.  Review of Systems:  All other systems reviewed and apart from HPI, are negative.  Past Medical History:  Diagnosis Date   Arthritis    Atrial fibrillation (HCC) 06/03/2013   Last  Assessment & Plan:  Formatting of this note might be different from the original. Continue propafenone for rhythm control .Patient has clear symptomatic benefit with sinus rhythm maintenance. CHADS-Vasc score over 2, continue anticoagulation with eliquis.   HTN (hypertension) 06/03/2013   Last Assessment & Plan:  Formatting of this note might be different from the original. Hypertension is well controlled.  Continue current medical regimen and low sodium diet.  Reevaluate at next office visit   Hypercholesterolemia    Restless leg syndrome    Urinary incontinence 08/08/2020    Past Surgical History:  Procedure Laterality Date   CARDIOVERSION     RADIOFREQUENCY ABLATION     ROTATOR CUFF REPAIR     TUBAL LIGATION      Social History:   reports that she has never smoked. She has never used smokeless tobacco. She reports that she does not drink alcohol and does not use drugs.  Allergies  Allergen Reactions   Tape Rash    USE PAPER TAPE ONLY!!!   Bee Venom Rash   Norvasc [Amlodipine] Swelling    Family History  Problem Relation Age of Onset   Heart disease Mother    Appendicitis Mother    Appendicitis Father    Heart disease Father    Heart disease Maternal Grandmother      Prior to Admission medications   Medication Sig Start Date End Date Taking? Authorizing Provider  acetaminophen (TYLENOL) 325 MG tablet Take 650 mg by mouth every 6 (six)  hours as needed for moderate pain or headache.   Yes [provider]  acetaminophen (TYLENOL) 500 MG tablet Take 500 mg by mouth every 6 (six) hours as needed for mild pain or moderate pain.   Yes [provider]  apixaban (ELIQUIS) 5 MG TABS tablet Take 1 tablet (5 mg total) by mouth 2 (two) times daily. 12/10/21  Yes Fenton, Clint R, PA  atorvastatin (LIPITOR) 40 MG tablet TAKE 1 TABLET BY MOUTH EVERY DAY Patient taking differently: Take 40 mg by mouth daily. 01/09/22  Yes Ardith Dark, MD  carvedilol (COREG) 12.5 MG  tablet Take 1 tablet (12.5 mg total) by mouth 2 (two) times daily with a meal. 02/07/21  Yes Parke Poisson, MD  cephALEXin (KEFLEX) 500 MG capsule Take 500 mg by mouth See admin instructions. Tid x 7 days 02/10/22  Yes [provider]  diclofenac Sodium (VOLTAREN) 1 % GEL Apply 2 g topically 4 (four) times daily as needed (knee pain).   Yes [provider]  furosemide (LASIX) 20 MG tablet Take 1 tablet (20 mg total) by mouth daily. 02/04/22  Yes Fenton, Clint R, PA  gabapentin (NEURONTIN) 100 MG capsule Take 1 capsule (100 mg total) by mouth at bedtime. 01/15/21  Yes Ardith Dark, MD  losartan (COZAAR) 100 MG tablet TAKE 1 TABLET BY MOUTH EVERY DAY Patient taking differently: Take 100 mg by mouth daily. 01/28/22  Yes Jarold Motto, PA  meclizine (ANTIVERT) 25 MG tablet Take 1 tablet (25 mg total) by mouth 3 (three) times daily as needed for dizziness. 01/15/21  Yes Ardith Dark, MD  MIRABEGRON ER PO Take 1 tablet by mouth daily. Pt given samples at doctors office unsure of dose   Yes [provider]  polyethylene glycol powder (GLYCOLAX/MIRALAX) 17 GM/SCOOP powder Take 17 g by mouth daily as needed for mild constipation.   Yes [provider]  senna (SENOKOT) 8.6 MG TABS tablet Take 2 tablets by mouth at bedtime as needed for mild constipation.   Yes [provider]    Physical Exam: Vitals:   02/18/22 2200 02/19/22 0015 02/19/22 0030 02/19/22 0145  BP: (!) 145/51 (!) 160/62 (!) 184/74   Pulse: 69 73 68   Resp: 13 15 19    Temp:  98 F (36.7 C) 97.9 F (36.6 C) 98.2 F (36.8 C)  TempSrc:  Oral Oral Oral  SpO2: 100%  100%   Weight:      Height:        Constitutional: NAD, calm  Eyes: PERTLA, lids and conjunctivae normal ENMT: Mucous membranes are moist. Posterior pharynx clear of any exudate or lesions.   Neck: supple, no masses  Respiratory: no wheezing, no crackles. No accessory muscle use.  Cardiovascular: S1 & S2 heard, regular  rate and rhythm. Pretibial pitting edema bilaterally. Abdomen: No distension, no tenderness, soft. Bowel sounds active.  Musculoskeletal: no clubbing / cyanosis. No joint deformity upper and lower extremities.   Skin: no significant rashes, lesions, ulcers. Warm, dry, well-perfused. Neurologic: CN 2-12 grossly intact. Moving all extremities. Alert and oriented.  Psychiatric: Pleasant. Cooperative.    Labs and Imaging on Admission: I have personally reviewed following labs and imaging studies  CBC: Recent Labs  Lab 02/18/22 1051 02/18/22 1530 02/18/22 2054  WBC 4.6 6.2  --   NEUTROABS 3.3  --   --   HGB 7.6 Repeated and verified X2.* 7.8* 8.5*  HCT 25.4* 27.3* 25.0*  MCV 74.0* 80.3  --  PLT 224.0 258  --    Basic Metabolic Panel: Recent Labs  Lab 02/18/22 1530 02/18/22 2054  NA 138 140  K 3.9 3.7  CL 104  --   CO2 24  --   GLUCOSE 119*  --   BUN 29*  --   CREATININE 1.00  --   CALCIUM 8.6*  --    GFR: Estimated Creatinine Clearance: 58.6 mL/min (by C-G formula based on SCr of 1 mg/dL). Liver Function Tests: Recent Labs  Lab 02/18/22 1530  AST 31  ALT 27  ALKPHOS 119  BILITOT 1.2  PROT 6.1*  ALBUMIN 3.5   No results for input(s): "LIPASE", "AMYLASE" in the last 168 hours. No results for input(s): "AMMONIA" in the last 168 hours. Coagulation Profile: No results for input(s): "INR", "PROTIME" in the last 168 hours. Cardiac Enzymes: No results for input(s): "CKTOTAL", "CKMB", "CKMBINDEX", "TROPONINI" in the last 168 hours. BNP (last 3 results) No results for input(s): "PROBNP" in the last 8760 hours. HbA1C: No results for input(s): "HGBA1C" in the last 72 hours. CBG: No results for input(s): "GLUCAP" in the last 168 hours. Lipid Profile: No results for input(s): "CHOL", "HDL", "LDLCALC", "TRIG", "CHOLHDL", "LDLDIRECT" in the last 72 hours. Thyroid Function Tests: No results for input(s): "TSH", "T4TOTAL", "FREET4", "T3FREE", "THYROIDAB" in the last 72  hours. Anemia Panel: Recent Labs    02/18/22 2048  TIBC 517*  IRON 38   Urine analysis:    Component Value Date/Time   COLORURINE STRAW (A) 02/18/2022 2045   APPEARANCEUR CLEAR 02/18/2022 2045   LABSPEC 1.008 02/18/2022 2045   PHURINE 6.0 02/18/2022 2045   GLUCOSEU NEGATIVE 02/18/2022 2045   HGBUR NEGATIVE 02/18/2022 2045   BILIRUBINUR NEGATIVE 02/18/2022 2045   KETONESUR NEGATIVE 02/18/2022 2045   PROTEINUR NEGATIVE 02/18/2022 2045   UROBILINOGEN 0.2 07/02/2016 1500   NITRITE NEGATIVE 02/18/2022 2045   LEUKOCYTESUR NEGATIVE 02/18/2022 2045   Sepsis Labs: @LABRCNTIP (procalcitonin:4,lacticidven:4) )No results found for this or any previous visit (from the past 240 hour(s)).   Radiological Exams on Admission: US PELVIC COMPLETE WITH TRANSVAGINAL  Result Date: 02/18/2022 CLINICAL DATA:  Postmenopausal uterine bleeding. EXAM: TRANSABDOMINAL AND TRANSVAGINAL ULTRASOUND OF PELVIS TECHNIQUE: Both transabdominal and transvaginal ultrasound examinations of the pelvis were performed. Transabdominal technique was performed for global imaging of the pelvis including uterus, ovaries, adnexal regions, and pelvic cul-de-sac. It was necessary to proceed with endovaginal exam following the transabdominal exam to visualize the uterus endometrium and adnexa. COMPARISON:  Pelvis radiograph 01/30/2021 FINDINGS: Uterus Measurements: 6.7 x 3.5 x 3.1 cm = volume: 38 mL. The uterus is poorly defined. Multiple echogenic shadowing structures in the uterus typical of calcifications. This likely represents of poorly defined fibroid when compared with prior pelvic radiograph, however is not well delineated on the current exam. Endometrium Not well delineated. Right ovary Not seen.  No adnexal mass. Left ovary Not seen.  No adnexal mass Other findings No abnormal free fluid. IMPRESSION: 1. Limited exam. 2. Uterine calcifications, with rounded calcifications on prior pelvic radiograph typical of calcified fibroids.  The uterus and presumed fibroids are not well delineated on the current exam. 3. The endometrium is not well defined, thickness cannot be measured. Given endometrial thickness could not be defined, further workup recommendations connect 3 provided based on the current exam. Recommend gyn consultation for consideration of endometrial sampling or further workup. 4. Neither ovary is seen.  No adnexal mass Electronically Signed   By: Keith Rake M.D.   On: 02/18/2022 23:46  DG Chest 2 View  Result Date: 02/18/2022 CLINICAL DATA:  Shortness of breath EXAM: CHEST - 2 VIEW COMPARISON:  04/12/2021 FINDINGS: Cardiomegaly, vascular congestion. No overt edema. No confluent opacities or effusions. No acute bony abnormality. IMPRESSION: Cardiomegaly, vascular congestion. Electronically Signed   By: Rolm Baptise M.D.   On: 02/18/2022 18:11    EKG: Independently reviewed. Atrial fibrillation.   Assessment/Plan   1. Symptomatic anemia; abnormal uterine bleeding   - Presents with insidiously worsening DOE and fatigue, has some edema and orthopnea (but not nearly as bad as early May 2023 when she started Lasix), also reports occasional vaginal bleeding, and is found to have Hgb 7.8, down from 10.7 in August 2022  - Transfuse 1 unit RBC for symptomatic anemia, check pelvic US, cautiously continue Eliquis given no acute bleeding, discussed with patient and daughter that she will need to see OBGYN on discharge    2. Acute on chronic HFpEF  - Presents with slowly worsening DOE, likely d/t worsening anemia and acute CHF  - Continue diuresis with Lasix 20 mg IV q12h, continue Coreg and losartan, monitor weight and I/Os    3. Atrial fibrillation  - Continue Coreg; no active bleeding, plan to continue Eliquis   4. Hypertension  - Continue Coreg and losartan    DVT prophylaxis: Eliquis  Code Status: Full  Level of Care: Level of care: Telemetry Cardiac Family Communication: Daughter updated at bedside   Disposition Plan:  Patient is from: home  Anticipated d/c is to: Home  Anticipated d/c date is: Possibly as early as 6/28 or 02/20/22  Patient currently: Pending improvement with blood transfusion and diuresis  Consults called: none  Admission status: Observation     Vianne Bulls, MD [facility]  02/19/2022, 2:42 AM

## 2022-02-19 ENCOUNTER — Other Ambulatory Visit (HOSPITAL_COMMUNITY): Payer: Medicare HMO

## 2022-02-19 ENCOUNTER — Observation Stay (HOSPITAL_COMMUNITY): Payer: Medicare HMO

## 2022-02-19 DIAGNOSIS — D649 Anemia, unspecified: Secondary | ICD-10-CM | POA: Diagnosis not present

## 2022-02-19 DIAGNOSIS — R160 Hepatomegaly, not elsewhere classified: Secondary | ICD-10-CM | POA: Diagnosis not present

## 2022-02-19 DIAGNOSIS — D509 Iron deficiency anemia, unspecified: Secondary | ICD-10-CM

## 2022-02-19 DIAGNOSIS — Z7901 Long term (current) use of anticoagulants: Secondary | ICD-10-CM | POA: Diagnosis not present

## 2022-02-19 DIAGNOSIS — R0602 Shortness of breath: Secondary | ICD-10-CM | POA: Diagnosis not present

## 2022-02-19 DIAGNOSIS — K31811 Angiodysplasia of stomach and duodenum with bleeding: Secondary | ICD-10-CM | POA: Diagnosis not present

## 2022-02-19 LAB — TYPE AND SCREEN
ABO/RH(D): O NEG
Antibody Screen: NEGATIVE
Unit division: 0

## 2022-02-19 LAB — BASIC METABOLIC PANEL
Anion gap: 8 (ref 5–15)
BUN: 25 mg/dL — ABNORMAL HIGH (ref 8–23)
CO2: 27 mmol/L (ref 22–32)
Calcium: 8.6 mg/dL — ABNORMAL LOW (ref 8.9–10.3)
Chloride: 105 mmol/L (ref 98–111)
Creatinine, Ser: 0.8 mg/dL (ref 0.44–1.00)
GFR, Estimated: >60 mL/min (ref >60)
Glucose, Bld: 101 mg/dL — ABNORMAL HIGH (ref 70–99)
Potassium: 3.4 mmol/L — ABNORMAL LOW (ref 3.5–5.1)
Sodium: 140 mmol/L (ref 135–145)

## 2022-02-19 LAB — CBC
HCT: 29 % — ABNORMAL LOW (ref 36.0–46.0)
Hemoglobin: 8.4 g/dL — ABNORMAL LOW (ref 12.0–15.0)
MCH: 23 pg — ABNORMAL LOW (ref 26.0–34.0)
MCHC: 29 g/dL — ABNORMAL LOW (ref 30.0–36.0)
MCV: 79.2 fL — ABNORMAL LOW (ref 80.0–100.0)
Platelets: 223 10*3/uL (ref 150–400)
RBC: 3.66 MIL/uL — ABNORMAL LOW (ref 3.87–5.11)
RDW: 18.1 % — ABNORMAL HIGH (ref 11.5–15.5)
WBC: 6.7 10*3/uL (ref 4.0–10.5)
nRBC: 0 % (ref 0.0–0.2)

## 2022-02-19 LAB — APTT: aPTT: 39 seconds — ABNORMAL HIGH (ref 24–36)

## 2022-02-19 LAB — BPAM RBC
Blood Product Expiration Date: 202307262359
ISSUE DATE / TIME: 202306272347
Unit Type and Rh: 9500

## 2022-02-19 LAB — VITAMIN B12: Vitamin B-12: 394 pg/mL (ref 180–914)

## 2022-02-19 LAB — PROTIME-INR
INR: 1.2 (ref 0.8–1.2)
INR: 1.2 (ref 0.8–1.2)
Prothrombin Time: 15.5 seconds — ABNORMAL HIGH (ref 11.4–15.2)
Prothrombin Time: 15 seconds (ref 11.4–15.2)

## 2022-02-19 LAB — FOLATE: Folate: 17.4 ng/mL (ref >5.9)

## 2022-02-19 MED ORDER — BISACODYL 5 MG PO TBEC: 20.0000 mg | DELAYED_RELEASE_TABLET | Freq: Once | ORAL | Status: DC

## 2022-02-19 MED ORDER — POLYETHYLENE GLYCOL 3350 17 G PO PACK: 17.0000 g | PACK | Freq: Every day | ORAL | Status: DC

## 2022-02-19 MED ORDER — SENNA 8.6 MG PO TABS
1.0000 | ORAL_TABLET | Freq: Two times a day (BID) | ORAL | Status: DC
  Administered 2022-02-19 (×2): 8.6 mg via ORAL
  Filled 2022-02-19 (×2): qty 1

## 2022-02-19 MED ORDER — POLYETHYLENE GLYCOL 3350 17 G PO PACK
17.0000 g | PACK | Freq: Every day | ORAL | Status: DC
  Administered 2022-02-19: 17 g via ORAL
  Filled 2022-02-19: qty 1

## 2022-02-19 MED ORDER — BISACODYL 10 MG RE SUPP: 10.0000 mg | Freq: Once | RECTAL | Status: DC

## 2022-02-19 MED ORDER — POTASSIUM CHLORIDE CRYS ER 20 MEQ PO TBCR
40.0000 meq | EXTENDED_RELEASE_TABLET | Freq: Once | ORAL | Status: AC
  Administered 2022-02-19: 40 meq via ORAL
  Filled 2022-02-19: qty 2

## 2022-02-19 MED ORDER — METOCLOPRAMIDE HCL 5 MG/ML IJ SOLN
10.0000 mg | Freq: Four times a day (QID) | INTRAMUSCULAR | Status: AC
  Administered 2022-02-19 (×2): 10 mg via INTRAVENOUS
  Filled 2022-02-19 (×2): qty 2

## 2022-02-19 MED ORDER — APIXABAN 5 MG PO TABS: 5.0000 mg | ORAL_TABLET | Freq: Two times a day (BID) | ORAL | Status: DC

## 2022-02-19 MED ORDER — PEG-KCL-NACL-NASULF-NA ASC-C 100 G PO SOLR
0.5000 | Freq: Once | ORAL | Status: DC
  Filled 2022-02-19: qty 1

## 2022-02-19 MED ORDER — PEG-KCL-NACL-NASULF-NA ASC-C 100 G PO SOLR: 1.0000 | Freq: Once | ORAL | Status: DC

## 2022-02-19 MED ORDER — PEG-KCL-NACL-NASULF-NA ASC-C 100 G PO SOLR
0.5000 | Freq: Once | ORAL | Status: AC
  Administered 2022-02-19: 100 g via ORAL

## 2022-02-19 NOTE — H&P (View-Only) (Signed)
[facility] Consult: 12:07 PM 02/19/2022  LOS: 0 days    Referring Provider: Dr Renford Dills.    Primary Care Physician:  Jarold Motto, [place] Primary Gastroenterologist:  Dr. Tomasa Rand    Reason for Consultation:  anemia.     HPI: Galen Malkowski is a 76 y.o. female.  PMH A fib.  On Eliquis.  2013 cardiac ablation.   Anemia with Hgb 9.9 -10.7 in August 2022.  Morbid obesity.  Suspected OSA.  Hypertension.  Referred and seen by Dr. Tomasa Rand in late 03/2021 regarding chronic anemia.,  Iron studies not strongly suggestive of iron deficiency.  Her GI complaints were minimal with chronic abdominal discomfort and constipation.  BMs occurring up to a week apart, using MiraLAX as needed.  No weight loss.  Previous colonoscopy had been "a long time ago".   She had upcoming appointment regarding cardiac ablation and complained of dyspnea.  Dr. Tomasa Rand planned to follow-up CBC in a couple of weeks and plan colonoscopy, EGD if significant drop in Hgb.  Also would be holding off on any studies to allow for cardiology studies, interventions.  Pt has not fup w GI and no fup CBC (last was 03/2021).   Office visit with cardiology PA in early May with complaints of dyspnea on exertion, orthopnea.  A-fib rate was controlled..  She was started on Lasix 20 mg daily.   Last PCP office visit until yesterday had been a year ago.  At yesterday's visit patient mentioned lower extremity swelling and ongoing DOE, fatigue with minor activity some episodes of dizziness but no syncope..  After office visit yesterday afternoon, lab work returned with Hb 7.6.  She was advised to go to the ED for evaluation.  Patient is a rambling historian.  She has a bowel movement anywhere from every 2 to 3 days.  She uses MiraLAX, yogurt and many wheats to help  her have bowel movements but even then sometimes has small balls of hard stool.  She she has not seen bloody stools but she has seen blood after wiping up after a bowel movement, also has seen some pinkish discharge on her depends underwear.  There is been some question of vaginal bleeding, she had pelvic ultrasound yesterday, see description a few paragraphs down.  Feels like she has had increase in abdominal girth over several months.  Denies abdominal pain.  Denies nausea, vomiting, poor appetite, dysphagia, heartburn.  Denies other unusual bleeding or bruising such as epistaxis, dermatologic bleeding, oral bleeding.  Says her teeth have been slowly eroding though she has good dental hygiene.  Its been a long time since she saw a dentist for evaluation and routine cleaning.  Does not use aspirin, NSAIDs.  Not taking any iron supplements or gastric acid suppressing medications.  Hgb 7.6 .Marland Kitchen 1 PRBC ... 8.4.  MCV 79.  FOBT negative.  INR 1.2.   Iron 38, normal.  TIBC elevated 517.  Iron sat normal.  Low normal ferritin 25.  Folate, B12 normal. BUN 25, creatinine normal.  Potassium 3.4. CXR with cardiomegaly, vascular congestion. Pelvic  ultrasound with uterine calcifications, though not well delineated presumed fibroids.  Indistinct endometrium, thickness not measurable.  Recommend GYN consult for consideration of endometrial sampling or further work-up.  Neither ovary visualized.   Family history includes MI, CAD, brain aneurysm, heart problems, Graves' disease.  A cousin had colon cancer, and niece had lung cancer.  No history colorectal cancers. Lives alone but her daughter lives nearby.  She still drives.  Past Medical History:  Diagnosis Date   Arthritis    Atrial fibrillation (HCC) 06/03/2013   Last Assessment & Plan:  Formatting of this note might be different from the original. Continue propafenone for rhythm control .Patient has clear symptomatic benefit with sinus rhythm maintenance.  CHADS-Vasc score over 2, continue anticoagulation with eliquis.   HTN (hypertension) 06/03/2013   Last Assessment & Plan:  Formatting of this note might be different from the original. Hypertension is well controlled.  Continue current medical regimen and low sodium diet.  Reevaluate at next office visit   Hypercholesterolemia    Restless leg syndrome    Urinary incontinence 08/08/2020    Past Surgical History:  Procedure Laterality Date   CARDIOVERSION     RADIOFREQUENCY ABLATION     ROTATOR CUFF REPAIR     TUBAL LIGATION      Prior to Admission medications   Medication Sig Start Date End Date Taking? Authorizing Provider  acetaminophen (TYLENOL) 325 MG tablet Take 650 mg by mouth every 6 (six) hours as needed for moderate pain or headache.   Yes [provider]  acetaminophen (TYLENOL) 500 MG tablet Take 500 mg by mouth every 6 (six) hours as needed for mild pain or moderate pain.   Yes [provider]  apixaban (ELIQUIS) 5 MG TABS tablet Take 1 tablet (5 mg total) by mouth 2 (two) times daily. 12/10/21  Yes Fenton, Clint R, PA  atorvastatin (LIPITOR) 40 MG tablet TAKE 1 TABLET BY MOUTH EVERY DAY Patient taking differently: Take 40 mg by mouth daily. 01/09/22  Yes Ardith Dark, MD  carvedilol (COREG) 12.5 MG tablet Take 1 tablet (12.5 mg total) by mouth 2 (two) times daily with a meal. 02/07/21  Yes Parke Poisson, MD  cephALEXin (KEFLEX) 500 MG capsule Take 500 mg by mouth See admin instructions. Tid x 7 days 02/10/22  Yes [provider]  diclofenac Sodium (VOLTAREN) 1 % GEL Apply 2 g topically 4 (four) times daily as needed (knee pain).   Yes [provider]  furosemide (LASIX) 20 MG tablet Take 1 tablet (20 mg total) by mouth daily. 02/04/22  Yes Fenton, Clint R, PA  gabapentin (NEURONTIN) 100 MG capsule Take 1 capsule (100 mg total) by mouth at bedtime. 01/15/21  Yes Ardith Dark, MD  losartan (COZAAR) 100 MG tablet TAKE 1 TABLET BY MOUTH  EVERY DAY Patient taking differently: Take 100 mg by mouth daily. 01/28/22  Yes Jarold Motto, PA  meclizine (ANTIVERT) 25 MG tablet Take 1 tablet (25 mg total) by mouth 3 (three) times daily as needed for dizziness. 01/15/21  Yes Ardith Dark, MD  MIRABEGRON ER PO Take 1 tablet by mouth daily. Pt given samples at doctors office unsure of dose   Yes [provider]  polyethylene glycol powder (GLYCOLAX/MIRALAX) 17 GM/SCOOP powder Take 17 g by mouth daily as needed for mild constipation.   Yes [provider]  senna (SENOKOT) 8.6 MG TABS tablet Take 2 tablets by mouth at bedtime as needed for mild constipation.  Yes [provider]    Scheduled Meds:  sodium chloride   Intravenous Once   atorvastatin  40 mg Oral Daily   bisacodyl  10 mg Rectal Once   carvedilol  12.5 mg Oral BID WC   furosemide  20 mg Intravenous Q12H   gabapentin  100 mg Oral QHS   losartan  100 mg Oral Daily   polyethylene glycol  17 g Oral Daily   senna  1 tablet Oral BID   sodium chloride flush  3 mL Intravenous Q12H   Infusions:  PRN Meds: acetaminophen **OR** acetaminophen, diclofenac Sodium, meclizine, ondansetron **OR** ondansetron (ZOFRAN) IV   Allergies as of 02/18/2022 - Review Complete 02/18/2022  Allergen Reaction Noted   Tape Rash 08/08/2020   Bee venom Rash 08/08/2020   Norvasc [amlodipine] Swelling 04/12/2021    Family History  Problem Relation Age of Onset   Heart disease Mother    Appendicitis Mother    Appendicitis Father    Heart disease Father    Heart disease Maternal Grandmother     Social History   Socioeconomic History   Marital status: Single    Spouse name: Not on file   Number of children: Not on file   Years of education: Not on file   Highest education level: Not on file  Occupational History   Not on file  Tobacco Use   Smoking status: Never   Smokeless tobacco: Never  Substance and Sexual Activity   Alcohol use: No   Drug use: No    Sexual activity: Not on file  Other Topics Concern   Not on file  Social History Narrative   Daughter in [place]   Lives by herself   Worked for [employer] for 20+ years   Social Determinants of Health   Financial Resource Strain: Not on file  Food Insecurity: Not on file  Transportation Needs: Not on file  Physical Activity: Not on file  Stress: Not on file  Social Connections: Not on file  Intimate Partner Violence: Not on file    REVIEW OF SYSTEMS: Constitutional: Fatigue, weakness. ENT:  No nose bleeds Pulm: Dyspnea on exertion.  No productive cough. CV:  No palpitations, no LE edema.  No angina GU:  No hematuria, no frequency GI: As per HPI. Heme: See HPI. Transfusions: Got 1 unit overnight.  No prior transfusions. Neuro: Some dizziness with positional change but no presyncope or syncope no headaches, no peripheral tingling or numbness Derm:  No itching, no rash or sores.  Endocrine:  No sweats or chills.  No polyuria or dysuria Immunization: Reviewed Travel: Not queried   PHYSICAL EXAM: Vital signs in last 24 hours: Vitals:   02/19/22 1204 02/19/22 1205  BP:  121/88  Pulse:  (!) 59  Resp:  13  Temp: 98 F (36.7 C)   SpO2:  100%   Wt Readings from Last 3 Encounters:  02/18/22 112.9 kg  02/18/22 113.2 kg  12/25/21 115.2 kg    General: Talkative, obese, does not look acutely ill  mildly chronically ill-appearing. Head: No facial asymmetry or swelling.  No signs of head trauma. Eyes: Conjunctiva pale Ears: Not hard of hearing Nose: No congestion or discharge Mouth: Oral mucosa moist, pink, clear.  Many absent teeth.  Extensive caries with tooth loss down to the gumline where you can see the carries written knobs of teeth Neck: No JVD Lungs: Diminished breath sounds but clear.  No labored breathing or cough at rest Heart: Irregularly irregular,  rate in the mid to upper 50s.  S1, S2 present.  No MRG Abdomen: Obese, not tender, not distended.  Active bowel  sounds.  Fullness/possible hepatomegaly in right upper quadrant.  No masses, no hernias, no bruits.   Rectal: Visible nonthrombosed external hemorrhoids, no palpable internal hemorrhoids.  No blood on exam glove, hard brown stool within reach of the finger, this was not FOBT tested. Musc/Skeltl: No joint redness, swelling or gross deformity Extremities: Nonpitting lower extremity edema. Neurologic: Alert.  Oriented x3.  Moves all 4 limbs.  No tremors or involuntary movement.  Strength not tested. Skin: No obvious sores, rashes, telangiectasia or suspicious lesions Nodes: No cervical adenopathy Psych: Cooperative, verbose/fluid speech.  Intake/Output from previous day: 06/27 0701 - 06/28 0700 In: 280 [Blood:280] Out: -  Intake/Output this shift: Total I/O In: -  Out: 1200 [Urine:1200]  LAB RESULTS: Recent Labs    02/18/22 1051 02/18/22 1530 02/18/22 2054 02/19/22 0343  WBC 4.6 6.2  --  6.7  HGB 7.6 Repeated and verified X2.* 7.8* 8.5* 8.4*  HCT 25.4* 27.3* 25.0* 29.0*  PLT 224.0 258  --  223   BMET Lab Results  Component Value Date   NA 140 02/19/2022   NA 140 02/18/2022   NA 138 02/18/2022   K 3.4 (L) 02/19/2022   K 3.7 02/18/2022   K 3.9 02/18/2022   CL 105 02/19/2022   CL 104 02/18/2022   CL 111 12/25/2021   CO2 27 02/19/2022   CO2 24 02/18/2022   CO2 25 12/25/2021   GLUCOSE 101 (H) 02/19/2022   GLUCOSE 119 (H) 02/18/2022   GLUCOSE 105 (H) 12/25/2021   BUN 25 (H) 02/19/2022   BUN 29 (H) 02/18/2022   BUN 15 12/25/2021   CREATININE 0.80 02/19/2022   CREATININE 1.00 02/18/2022   CREATININE 0.94 12/25/2021   CALCIUM 8.6 (L) 02/19/2022   CALCIUM 8.6 (L) 02/18/2022   CALCIUM 8.7 (L) 12/25/2021   LFT Recent Labs    02/18/22 1530  PROT 6.1*  ALBUMIN 3.5  AST 31  ALT 27  ALKPHOS 119  BILITOT 1.2   PT/INR Lab Results  Component Value Date   INR 1.2 02/19/2022   Hepatitis Panel No results for input(s): "HEPBSAG", "HCVAB", "HEPAIGM", "HEPBIGM" in the  last 72 hours. C-Diff No components found for: "CDIFF" Lipase  No results found for: "LIPASE"  Drugs of Abuse  No results found for: "LABOPIA", "COCAINSCRNUR", "LABBENZ", "AMPHETMU", "THCU", "LABBARB"   RADIOLOGY STUDIES: US PELVIC COMPLETE WITH TRANSVAGINAL  Result Date: 02/18/2022 CLINICAL DATA:  Postmenopausal uterine bleeding. EXAM: TRANSABDOMINAL AND TRANSVAGINAL ULTRASOUND OF PELVIS TECHNIQUE: Both transabdominal and transvaginal ultrasound examinations of the pelvis were performed. Transabdominal technique was performed for global imaging of the pelvis including uterus, ovaries, adnexal regions, and pelvic cul-de-sac. It was necessary to proceed with endovaginal exam following the transabdominal exam to visualize the uterus endometrium and adnexa. COMPARISON:  Pelvis radiograph 01/30/2021 FINDINGS: Uterus Measurements: 6.7 x 3.5 x 3.1 cm = volume: 38 mL. The uterus is poorly defined. Multiple echogenic shadowing structures in the uterus typical of calcifications. This likely represents of poorly defined fibroid when compared with prior pelvic radiograph, however is not well delineated on the current exam. Endometrium Not well delineated. Right ovary Not seen.  No adnexal mass. Left ovary Not seen.  No adnexal mass Other findings No abnormal free fluid. IMPRESSION: 1. Limited exam. 2. Uterine calcifications, with rounded calcifications on prior pelvic radiograph typical of calcified fibroids. The uterus and presumed fibroids are  not well delineated on the current exam. 3. The endometrium is not well defined, thickness cannot be measured. Given endometrial thickness could not be defined, further workup recommendations connect 3 provided based on the current exam. Recommend gyn consultation for consideration of endometrial sampling or further workup. 4. Neither ovary is seen.  No adnexal mass Electronically Signed   By: Keith Rake M.D.   On: 02/18/2022 23:46   DG Chest 2 View  Result Date:  02/18/2022 CLINICAL DATA:  Shortness of breath EXAM: CHEST - 2 VIEW COMPARISON:  04/12/2021 FINDINGS: Cardiomegaly, vascular congestion. No overt edema. No confluent opacities or effusions. No acute bony abnormality. IMPRESSION: Cardiomegaly, vascular congestion. Electronically Signed   By: Rolm Baptise M.D.   On: 02/18/2022 18:11      IMPRESSION:   Progressive normocytic anemia.  FOBT negative currently as well as on testing last year.  Infrequent episodes of minor bleeding per rectum, seen on tissue.  Permanent A-fib.  Chronic Eliquis.  Last dose 6/27 AM.     Suspected OSA.  Not sure if she was ever referred for sleep studies but it has been suggested to patient in past.  Variable compliance with follow-ups with various specialists and primary care provider.    Chronic constipation.  FOBT negative.  PLAN:       EGD and colonoscopy tomorrow.  D/w pt and dtr.     See prep orders.  Patient is already on clear liquids.  Abdominal ultrasound ordered to evaluate what feels like hepatomegaly on today's exam.  Patient is certainly at risk for fatty liver disease.  Platelets, LFTs normal.    Ordered INR.   Azucena Freed  02/19/2022, 12:07 PM Phone [phone]  GI ATTENDING  History, laboratories, x-rays reviewed.  Patient personally seen and examined in the emergency room.  Daughter at bedside.  Agree with comprehensive H&P as outlined above.  76 year old with multiple medical problems as outlined who presents with progressive dyspnea.  Found to have significant interval anemia.  No evidence for iron deficiency or GI bleeding.  Is on chronic anticoagulation which has been held.  Has received transfusions.  Feeling a little better.  Plans are for colonoscopy and upper endoscopy tomorrow to rule out GI mucosal lesions to explain anemia.  If no significant pathology found, we would recommend formal hematology evaluation.  The patient is high risk for her procedures given her body habitus and  comorbidities.  Also, abdominal ultrasound ordered to assess for possible hepatomegaly and evaluate complaints of abdominal distention.  The nature of the procedure, as well as the risks, benefits, and alternatives were carefully and thoroughly reviewed with the patient. Ample time for discussion and questions allowed. The patient understood, was satisfied, and agreed to proceed.  Docia Chuck. Geri Seminole., M.D. [facility] Division of Gastroenterology

## 2022-02-19 NOTE — ED Notes (Signed)
Pure Shonna Chock was removed for patient to walk to bathroom to have a bm.

## 2022-02-19 NOTE — Consult Note (Addendum)
[facility] Consult: 12:07 PM 02/19/2022  LOS: 0 days    Referring Provider: Dr Renford Dills.    Primary Care Physician:  Jarold Motto, [place] Primary Gastroenterologist:  Dr. Tomasa Rand    Reason for Consultation:  anemia.     HPI: Tara Collier is a 76 y.o. female.  PMH A fib.  On Eliquis.  2013 cardiac ablation.   Anemia with Hgb 9.9 -10.7 in August 2022.  Morbid obesity.  Suspected OSA.  Hypertension.  Referred and seen by Dr. Tomasa Rand in late 03/2021 regarding chronic anemia.,  Iron studies not strongly suggestive of iron deficiency.  Her GI complaints were minimal with chronic abdominal discomfort and constipation.  BMs occurring up to a week apart, using MiraLAX as needed.  No weight loss.  Previous colonoscopy had been "a long time ago".   She had upcoming appointment regarding cardiac ablation and complained of dyspnea.  Dr. Tomasa Rand planned to follow-up CBC in a couple of weeks and plan colonoscopy, EGD if significant drop in Hgb.  Also would be holding off on any studies to allow for cardiology studies, interventions.  Pt has not fup w GI and no fup CBC (last was 03/2021).   Office visit with cardiology PA in early May with complaints of dyspnea on exertion, orthopnea.  A-fib rate was controlled..  She was started on Lasix 20 mg daily.   Last PCP office visit until yesterday had been a year ago.  At yesterday's visit patient mentioned lower extremity swelling and ongoing DOE, fatigue with minor activity some episodes of dizziness but no syncope..  After office visit yesterday afternoon, lab work returned with Hb 7.6.  She was advised to go to the ED for evaluation.  Patient is a rambling historian.  She has a bowel movement anywhere from every 2 to 3 days.  She uses MiraLAX, yogurt and many wheats to help  her have bowel movements but even then sometimes has small balls of hard stool.  She she has not seen bloody stools but she has seen blood after wiping up after a bowel movement, also has seen some pinkish discharge on her depends underwear.  There is been some question of vaginal bleeding, she had pelvic ultrasound yesterday, see description a few paragraphs down.  Feels like she has had increase in abdominal girth over several months.  Denies abdominal pain.  Denies nausea, vomiting, poor appetite, dysphagia, heartburn.  Denies other unusual bleeding or bruising such as epistaxis, dermatologic bleeding, oral bleeding.  Says her teeth have been slowly eroding though she has good dental hygiene.  Its been a long time since she saw a dentist for evaluation and routine cleaning.  Does not use aspirin, NSAIDs.  Not taking any iron supplements or gastric acid suppressing medications.  Hgb 7.6 .Marland Kitchen 1 PRBC ... 8.4.  MCV 79.  FOBT negative.  INR 1.2.   Iron 38, normal.  TIBC elevated 517.  Iron sat normal.  Low normal ferritin 25.  Folate, B12 normal. BUN 25, creatinine normal.  Potassium 3.4. CXR with cardiomegaly, vascular congestion. Pelvic  ultrasound with uterine calcifications, though not well delineated presumed fibroids.  Indistinct endometrium, thickness not measurable.  Recommend GYN consult for consideration of endometrial sampling or further work-up.  Neither ovary visualized.   Family history includes MI, CAD, brain aneurysm, heart problems, Graves' disease.  A cousin had colon cancer, and niece had lung cancer.  No history colorectal cancers. Lives alone but her daughter lives nearby.  She still drives.  Past Medical History:  Diagnosis Date   Arthritis    Atrial fibrillation (HCC) 06/03/2013   Last Assessment & Plan:  Formatting of this note might be different from the original. Continue propafenone for rhythm control .Patient has clear symptomatic benefit with sinus rhythm maintenance.  CHADS-Vasc score over 2, continue anticoagulation with eliquis.   HTN (hypertension) 06/03/2013   Last Assessment & Plan:  Formatting of this note might be different from the original. Hypertension is well controlled.  Continue current medical regimen and low sodium diet.  Reevaluate at next office visit   Hypercholesterolemia    Restless leg syndrome    Urinary incontinence 08/08/2020    Past Surgical History:  Procedure Laterality Date   CARDIOVERSION     RADIOFREQUENCY ABLATION     ROTATOR CUFF REPAIR     TUBAL LIGATION      Prior to Admission medications   Medication Sig Start Date End Date Taking? Authorizing Provider  acetaminophen (TYLENOL) 325 MG tablet Take 650 mg by mouth every 6 (six) hours as needed for moderate pain or headache.   Yes [provider]  acetaminophen (TYLENOL) 500 MG tablet Take 500 mg by mouth every 6 (six) hours as needed for mild pain or moderate pain.   Yes [provider]  apixaban (ELIQUIS) 5 MG TABS tablet Take 1 tablet (5 mg total) by mouth 2 (two) times daily. 12/10/21  Yes Fenton, Clint R, PA  atorvastatin (LIPITOR) 40 MG tablet TAKE 1 TABLET BY MOUTH EVERY DAY Patient taking differently: Take 40 mg by mouth daily. 01/09/22  Yes Parker, Caleb M, MD  carvedilol (COREG) 12.5 MG tablet Take 1 tablet (12.5 mg total) by mouth 2 (two) times daily with a meal. 02/07/21  Yes Acharya, Gayatri A, MD  cephALEXin (KEFLEX) 500 MG capsule Take 500 mg by mouth See admin instructions. Tid x 7 days 02/10/22  Yes [provider]  diclofenac Sodium (VOLTAREN) 1 % GEL Apply 2 g topically 4 (four) times daily as needed (knee pain).   Yes [provider]  furosemide (LASIX) 20 MG tablet Take 1 tablet (20 mg total) by mouth daily. 02/04/22  Yes Fenton, Clint R, PA  gabapentin (NEURONTIN) 100 MG capsule Take 1 capsule (100 mg total) by mouth at bedtime. 01/15/21  Yes Parker, Caleb M, MD  losartan (COZAAR) 100 MG tablet TAKE 1 TABLET BY MOUTH  EVERY DAY Patient taking differently: Take 100 mg by mouth daily. 01/28/22  Yes Worley, Samantha, PA  meclizine (ANTIVERT) 25 MG tablet Take 1 tablet (25 mg total) by mouth 3 (three) times daily as needed for dizziness. 01/15/21  Yes Parker, Caleb M, MD  MIRABEGRON ER PO Take 1 tablet by mouth daily. Pt given samples at doctors office unsure of dose   Yes [provider]  polyethylene glycol powder (GLYCOLAX/MIRALAX) 17 GM/SCOOP powder Take 17 g by mouth daily as needed for mild constipation.   Yes [provider]  senna (SENOKOT) 8.6 MG TABS tablet Take 2 tablets by mouth at bedtime as needed for mild constipation.     Yes [provider]    Scheduled Meds:  sodium chloride   Intravenous Once   atorvastatin  40 mg Oral Daily   bisacodyl  10 mg Rectal Once   carvedilol  12.5 mg Oral BID WC   furosemide  20 mg Intravenous Q12H   gabapentin  100 mg Oral QHS   losartan  100 mg Oral Daily   polyethylene glycol  17 g Oral Daily   senna  1 tablet Oral BID   sodium chloride flush  3 mL Intravenous Q12H   Infusions:  PRN Meds: acetaminophen **OR** acetaminophen, diclofenac Sodium, meclizine, ondansetron **OR** ondansetron (ZOFRAN) IV   Allergies as of 02/18/2022 - Review Complete 02/18/2022  Allergen Reaction Noted   Tape Rash 08/08/2020   Bee venom Rash 08/08/2020   Norvasc [amlodipine] Swelling 04/12/2021    Family History  Problem Relation Age of Onset   Heart disease Mother    Appendicitis Mother    Appendicitis Father    Heart disease Father    Heart disease Maternal Grandmother     Social History   Socioeconomic History   Marital status: Single    Spouse name: Not on file   Number of children: Not on file   Years of education: Not on file   Highest education level: Not on file  Occupational History   Not on file  Tobacco Use   Smoking status: Never   Smokeless tobacco: Never  Substance and Sexual Activity   Alcohol use: No   Drug use: No    Sexual activity: Not on file  Other Topics Concern   Not on file  Social History Narrative   Daughter in [place]   Lives by herself   Worked for [employer] for 20+ years   Social Determinants of Health   Financial Resource Strain: Not on file  Food Insecurity: Not on file  Transportation Needs: Not on file  Physical Activity: Not on file  Stress: Not on file  Social Connections: Not on file  Intimate Partner Violence: Not on file    REVIEW OF SYSTEMS: Constitutional: Fatigue, weakness. ENT:  No nose bleeds Pulm: Dyspnea on exertion.  No productive cough. CV:  No palpitations, no LE edema.  No angina GU:  No hematuria, no frequency GI: As per HPI. Heme: See HPI. Transfusions: Got 1 unit overnight.  No prior transfusions. Neuro: Some dizziness with positional change but no presyncope or syncope no headaches, no peripheral tingling or numbness Derm:  No itching, no rash or sores.  Endocrine:  No sweats or chills.  No polyuria or dysuria Immunization: Reviewed Travel: Not queried   PHYSICAL EXAM: Vital signs in last 24 hours: Vitals:   02/19/22 1204 02/19/22 1205  BP:  121/88  Pulse:  (!) 59  Resp:  13  Temp: 98 F (36.7 C)   SpO2:  100%   Wt Readings from Last 3 Encounters:  02/18/22 112.9 kg  02/18/22 113.2 kg  12/25/21 115.2 kg    General: Talkative, obese, does not look acutely ill  mildly chronically ill-appearing. Head: No facial asymmetry or swelling.  No signs of head trauma. Eyes: Conjunctiva pale Ears: Not hard of hearing Nose: No congestion or discharge Mouth: Oral mucosa moist, pink, clear.  Many absent teeth.  Extensive caries with tooth loss down to the gumline where you can see the carries written knobs of teeth Neck: No JVD Lungs: Diminished breath sounds but clear.  No labored breathing or cough at rest Heart: Irregularly irregular,  rate in the mid to upper 50s.  S1, S2 present.  No MRG Abdomen: Obese, not tender, not distended.  Active bowel  sounds.  Fullness/possible hepatomegaly in right upper quadrant.  No masses, no hernias, no bruits.   Rectal: Visible nonthrombosed external hemorrhoids, no palpable internal hemorrhoids.  No blood on exam glove, hard brown stool within reach of the finger, this was not FOBT tested. Musc/Skeltl: No joint redness, swelling or gross deformity Extremities: Nonpitting lower extremity edema. Neurologic: Alert.  Oriented x3.  Moves all 4 limbs.  No tremors or involuntary movement.  Strength not tested. Skin: No obvious sores, rashes, telangiectasia or suspicious lesions Nodes: No cervical adenopathy Psych: Cooperative, verbose/fluid speech.  Intake/Output from previous day: 06/27 0701 - 06/28 0700 In: 280 [Blood:280] Out: -  Intake/Output this shift: Total I/O In: -  Out: 1200 [Urine:1200]  LAB RESULTS: Recent Labs    02/18/22 1051 02/18/22 1530 02/18/22 2054 02/19/22 0343  WBC 4.6 6.2  --  6.7  HGB 7.6 Repeated and verified X2.* 7.8* 8.5* 8.4*  HCT 25.4* 27.3* 25.0* 29.0*  PLT 224.0 258  --  223   BMET Lab Results  Component Value Date   NA 140 02/19/2022   NA 140 02/18/2022   NA 138 02/18/2022   K 3.4 (L) 02/19/2022   K 3.7 02/18/2022   K 3.9 02/18/2022   CL 105 02/19/2022   CL 104 02/18/2022   CL 111 12/25/2021   CO2 27 02/19/2022   CO2 24 02/18/2022   CO2 25 12/25/2021   GLUCOSE 101 (H) 02/19/2022   GLUCOSE 119 (H) 02/18/2022   GLUCOSE 105 (H) 12/25/2021   BUN 25 (H) 02/19/2022   BUN 29 (H) 02/18/2022   BUN 15 12/25/2021   CREATININE 0.80 02/19/2022   CREATININE 1.00 02/18/2022   CREATININE 0.94 12/25/2021   CALCIUM 8.6 (L) 02/19/2022   CALCIUM 8.6 (L) 02/18/2022   CALCIUM 8.7 (L) 12/25/2021   LFT Recent Labs    02/18/22 1530  PROT 6.1*  ALBUMIN 3.5  AST 31  ALT 27  ALKPHOS 119  BILITOT 1.2   PT/INR Lab Results  Component Value Date   INR 1.2 02/19/2022   Hepatitis Panel No results for input(s): "HEPBSAG", "HCVAB", "HEPAIGM", "HEPBIGM" in the  last 72 hours. C-Diff No components found for: "CDIFF" Lipase  No results found for: "LIPASE"  Drugs of Abuse  No results found for: "LABOPIA", "COCAINSCRNUR", "LABBENZ", "AMPHETMU", "THCU", "LABBARB"   RADIOLOGY STUDIES: US PELVIC COMPLETE WITH TRANSVAGINAL  Result Date: 02/18/2022 CLINICAL DATA:  Postmenopausal uterine bleeding. EXAM: TRANSABDOMINAL AND TRANSVAGINAL ULTRASOUND OF PELVIS TECHNIQUE: Both transabdominal and transvaginal ultrasound examinations of the pelvis were performed. Transabdominal technique was performed for global imaging of the pelvis including uterus, ovaries, adnexal regions, and pelvic cul-de-sac. It was necessary to proceed with endovaginal exam following the transabdominal exam to visualize the uterus endometrium and adnexa. COMPARISON:  Pelvis radiograph 01/30/2021 FINDINGS: Uterus Measurements: 6.7 x 3.5 x 3.1 cm = volume: 38 mL. The uterus is poorly defined. Multiple echogenic shadowing structures in the uterus typical of calcifications. This likely represents of poorly defined fibroid when compared with prior pelvic radiograph, however is not well delineated on the current exam. Endometrium Not well delineated. Right ovary Not seen.  No adnexal mass. Left ovary Not seen.  No adnexal mass Other findings No abnormal free fluid. IMPRESSION: 1. Limited exam. 2. Uterine calcifications, with rounded calcifications on prior pelvic radiograph typical of calcified fibroids. The uterus and presumed fibroids are  not well delineated on the current exam. 3. The endometrium is not well defined, thickness cannot be measured. Given endometrial thickness could not be defined, further workup recommendations connect 3 provided based on the current exam. Recommend gyn consultation for consideration of endometrial sampling or further workup. 4. Neither ovary is seen.  No adnexal mass Electronically Signed   By: Keith Rake M.D.   On: 02/18/2022 23:46   DG Chest 2 View  Result Date:  02/18/2022 CLINICAL DATA:  Shortness of breath EXAM: CHEST - 2 VIEW COMPARISON:  04/12/2021 FINDINGS: Cardiomegaly, vascular congestion. No overt edema. No confluent opacities or effusions. No acute bony abnormality. IMPRESSION: Cardiomegaly, vascular congestion. Electronically Signed   By: Rolm Baptise M.D.   On: 02/18/2022 18:11      IMPRESSION:   Progressive normocytic anemia.  FOBT negative currently as well as on testing last year.  Infrequent episodes of minor bleeding per rectum, seen on tissue.  Permanent A-fib.  Chronic Eliquis.  Last dose 6/27 AM.     Suspected OSA.  Not sure if she was ever referred for sleep studies but it has been suggested to patient in past.  Variable compliance with follow-ups with various specialists and primary care provider.    Chronic constipation.  FOBT negative.  PLAN:       EGD and colonoscopy tomorrow.  D/w pt and dtr.     See prep orders.  Patient is already on clear liquids.  Abdominal ultrasound ordered to evaluate what feels like hepatomegaly on today's exam.  Patient is certainly at risk for fatty liver disease.  Platelets, LFTs normal.    Ordered INR.   Azucena Freed  02/19/2022, 12:07 PM Phone [phone]  GI ATTENDING  History, laboratories, x-rays reviewed.  Patient personally seen and examined in the emergency room.  Daughter at bedside.  Agree with comprehensive H&P as outlined above.  76 year old with multiple medical problems as outlined who presents with progressive dyspnea.  Found to have significant interval anemia.  No evidence for iron deficiency or GI bleeding.  Is on chronic anticoagulation which has been held.  Has received transfusions.  Feeling a little better.  Plans are for colonoscopy and upper endoscopy tomorrow to rule out GI mucosal lesions to explain anemia.  If no significant pathology found, we would recommend formal hematology evaluation.  The patient is high risk for her procedures given her body habitus and  comorbidities.  Also, abdominal ultrasound ordered to assess for possible hepatomegaly and evaluate complaints of abdominal distention.  The nature of the procedure, as well as the risks, benefits, and alternatives were carefully and thoroughly reviewed with the patient. Ample time for discussion and questions allowed. The patient understood, was satisfied, and agreed to proceed.  Docia Chuck. Geri Seminole., M.D. [facility] Division of Gastroenterology

## 2022-02-19 NOTE — Progress Notes (Addendum)
PROGRESS NOTE  Tara Collier  [id number] DOB: 1945/11/13 DOA: 02/18/2022 PCP: Inda Coke, PA   Brief Narrative:  Patient is a 76 year old female with history of permanent A-fib on Eliquis, chronic diastolic CHF, hypertension who presented for the evaluation of shortness of breath, fatigue.  Her PCP did lab work at the office and was found to have low hemoglobin and she was sent to the ED.  She also reported bilateral lower extremity edema, orthopnea.  Patient also reported blood on the toilet per when she wiped a few days ago, no history of hematochezia or melena.  On presentation her hemoglobin was in the range of 7.  Patient was given at unit of blood transfusion, started on IV Lasix.  Assessment & Plan:  Principal Problem:   Symptomatic anemia Active Problems:   Atrial fibrillation (HCC)   HTN (hypertension)   Uterine bleeding   Acute on chronic diastolic CHF (congestive heart failure) (HCC)   Normocytic anemia: Her hemoglobin was in the range of 11 about 10 months ago.  Hemoglobin has dropped to the range of 7.  Given a unit of transfusion, hemoglobin this morning in the range of 8. She was evaluated by GI in the past and there was a plan for colonoscopy.  Though FOBT was negative and she declines hematochezia or melena, I will request GI consultation for further work-up for this unexplainable anemia.  Iron panel shows normal iron level.  Vaginal/uterine bleeding: Saw some of blood in the toilet paper on wiping.  Ultrasound of the pelvis does not show any significant finding with fibroids or endometrial thickening but it was a poor study .  We recommend to follow-up as an outpatient with gynecology.  Acute on chronic diastolic congestive heart failure: Presented with bilateral lower EXTR edema, orthopnea, weakness.  Found to be volume overloaded on presentation.  Elevated BNP.  Chest CT showed vascular congestion.  Started on Lasix 20 mg IV every 12 ,we will continue that for  now.  Continue losartan  Permanent A-fib: Follows with cardiology.  Remains in A-fib.  Rate controlled with Coreg.  Hypertension: Continue current medication.  Monitor blood pressure.  Currently normotensive  Constipation: Continue bowel regimen  Morbid obesity: BMI 45.5  Hypokalemia: Supplemented with potassium  Debiltiy/deconditioning: Patient reported increased weakness, fatigue on ambulation.  We will request for PT/OT evaluation           DVT prophylaxis:SCDs Start: 02/18/22 2246 apixaban (ELIQUIS) tablet 5 mg     Code Status: Full Code  Family Communication: Daughter at bedside  Patient status:Inpatient  Patient is from :Home  Anticipated discharge NE:[id number]  Estimated DC date:1-2 days   Consultants: None  Procedures:None  Antimicrobials:  Anti-infectives (From admission, onward)    None       Subjective: Patient seen and examined at the bedside this morning.  Hemodynamically stable.  On room air.  Complains of weakness, fatigue.  Did not report any melena or hematochezia overnight.  Daughter at the bedside.  Able to go home.  Complains of not having bowel movement  Objective: Vitals:   02/19/22 0600 02/19/22 0629 02/19/22 0725 02/19/22 0800  BP:  (!) 162/84 120/79 (!) 143/71  Pulse: 61 64 (!) 55 (!) 56  Resp: 19 18 19 18   Temp:      TempSrc:      SpO2: 99% 99% 98% 99%  Weight:      Height:        Intake/Output Summary (Last 24 hours) at 02/19/2022 0848 Last  data filed at 02/19/2022 0145 Gross per 24 hour  Intake 280 ml  Output --  Net 280 ml   Filed Weights   02/18/22 1527  Weight: 112.9 kg    Examination:  General exam: Overall comfortable, not in distress but weak, looks deconditioned, morbidly obese HEENT: PERRL Respiratory system:  no wheezes or crackles  Cardiovascular system: S1 & S2 heard, RRR.  Gastrointestinal system: Abdomen is nondistended, soft and nontender. Central nervous system: Alert and oriented Extremities:  Trace bilateral lower extremity pitting edema, no clubbing ,no cyanosis Skin: No rashes, no ulcers,no icterus     Data Reviewed: I have personally reviewed following labs and imaging studies  CBC: Recent Labs  Lab 02/18/22 1051 02/18/22 1530 02/18/22 2054 02/19/22 0343  WBC 4.6 6.2  --  6.7  NEUTROABS 3.3  --   --   --   HGB 7.6 Repeated and verified X2.* 7.8* 8.5* 8.4*  HCT 25.4* 27.3* 25.0* 29.0*  MCV 74.0* 80.3  --  79.2*  PLT 224.0 258  --  223   Basic Metabolic Panel: Recent Labs  Lab 02/18/22 1530 02/18/22 2054 02/19/22 0343  NA 138 140 140  K 3.9 3.7 3.4*  CL 104  --  105  CO2 24  --  27  GLUCOSE 119*  --  101*  BUN 29*  --  25*  CREATININE 1.00  --  0.80  CALCIUM 8.6*  --  8.6*     No results found for this or any previous visit (from the past 240 hour(s)).   Radiology Studies: US PELVIC COMPLETE WITH TRANSVAGINAL  Result Date: 02/18/2022 CLINICAL DATA:  Postmenopausal uterine bleeding. EXAM: TRANSABDOMINAL AND TRANSVAGINAL ULTRASOUND OF PELVIS TECHNIQUE: Both transabdominal and transvaginal ultrasound examinations of the pelvis were performed. Transabdominal technique was performed for global imaging of the pelvis including uterus, ovaries, adnexal regions, and pelvic cul-de-sac. It was necessary to proceed with endovaginal exam following the transabdominal exam to visualize the uterus endometrium and adnexa. COMPARISON:  Pelvis radiograph 01/30/2021 FINDINGS: Uterus Measurements: 6.7 x 3.5 x 3.1 cm = volume: 38 mL. The uterus is poorly defined. Multiple echogenic shadowing structures in the uterus typical of calcifications. This likely represents of poorly defined fibroid when compared with prior pelvic radiograph, however is not well delineated on the current exam. Endometrium Not well delineated. Right ovary Not seen.  No adnexal mass. Left ovary Not seen.  No adnexal mass Other findings No abnormal free fluid. IMPRESSION: 1. Limited exam. 2. Uterine  calcifications, with rounded calcifications on prior pelvic radiograph typical of calcified fibroids. The uterus and presumed fibroids are not well delineated on the current exam. 3. The endometrium is not well defined, thickness cannot be measured. Given endometrial thickness could not be defined, further workup recommendations connect 3 provided based on the current exam. Recommend gyn consultation for consideration of endometrial sampling or further workup. 4. Neither ovary is seen.  No adnexal mass Electronically Signed   By: Narda Rutherford M.D.   On: 02/18/2022 23:46   DG Chest 2 View  Result Date: 02/18/2022 CLINICAL DATA:  Shortness of breath EXAM: CHEST - 2 VIEW COMPARISON:  04/12/2021 FINDINGS: Cardiomegaly, vascular congestion. No overt edema. No confluent opacities or effusions. No acute bony abnormality. IMPRESSION: Cardiomegaly, vascular congestion. Electronically Signed   By: Charlett Nose M.D.   On: 02/18/2022 18:11    Scheduled Meds:  sodium chloride   Intravenous Once   apixaban  5 mg Oral BID   atorvastatin  40  mg Oral Daily   bisacodyl  10 mg Rectal Once   carvedilol  12.5 mg Oral BID WC   furosemide  20 mg Intravenous Q12H   gabapentin  100 mg Oral QHS   losartan  100 mg Oral Daily   polyethylene glycol  17 g Oral Daily   potassium chloride  40 mEq Oral Once   senna  1 tablet Oral BID   sodium chloride flush  3 mL Intravenous Q12H   Continuous Infusions:   LOS: 0 days   Burnadette Pop, MD [facility] P6/28/2023, 8:48 AM

## 2022-02-20 ENCOUNTER — Encounter (HOSPITAL_COMMUNITY)
Admission: EM | Disposition: A | Discharge status: Home / Self Care | Payer: Self-pay | Source: Home / Self Care | Attending: Internal Medicine

## 2022-02-20 ENCOUNTER — Observation Stay (HOSPITAL_COMMUNITY): Payer: Medicare HMO

## 2022-02-20 ENCOUNTER — Observation Stay (HOSPITAL_BASED_OUTPATIENT_CLINIC_OR_DEPARTMENT_OTHER): Payer: Medicare HMO | Admitting: Certified Registered"

## 2022-02-20 ENCOUNTER — Encounter (HOSPITAL_COMMUNITY): Payer: Self-pay | Admitting: Family Medicine

## 2022-02-20 ENCOUNTER — Observation Stay (HOSPITAL_COMMUNITY): Payer: Medicare HMO | Admitting: Certified Registered"

## 2022-02-20 DIAGNOSIS — K449 Diaphragmatic hernia without obstruction or gangrene: Secondary | ICD-10-CM | POA: Diagnosis present

## 2022-02-20 DIAGNOSIS — Z79899 Other long term (current) drug therapy: Secondary | ICD-10-CM | POA: Diagnosis not present

## 2022-02-20 DIAGNOSIS — R160 Hepatomegaly, not elsewhere classified: Secondary | ICD-10-CM | POA: Diagnosis present

## 2022-02-20 DIAGNOSIS — Z6841 Body Mass Index (BMI) 40.0 and over, adult: Secondary | ICD-10-CM | POA: Diagnosis not present

## 2022-02-20 DIAGNOSIS — I4891 Unspecified atrial fibrillation: Secondary | ICD-10-CM | POA: Diagnosis not present

## 2022-02-20 DIAGNOSIS — I5033 Acute on chronic diastolic (congestive) heart failure: Secondary | ICD-10-CM | POA: Diagnosis present

## 2022-02-20 DIAGNOSIS — I4821 Permanent atrial fibrillation: Secondary | ICD-10-CM | POA: Diagnosis present

## 2022-02-20 DIAGNOSIS — K648 Other hemorrhoids: Secondary | ICD-10-CM | POA: Diagnosis present

## 2022-02-20 DIAGNOSIS — K5909 Other constipation: Secondary | ICD-10-CM | POA: Diagnosis present

## 2022-02-20 DIAGNOSIS — I10 Essential (primary) hypertension: Secondary | ICD-10-CM

## 2022-02-20 DIAGNOSIS — D509 Iron deficiency anemia, unspecified: Secondary | ICD-10-CM | POA: Diagnosis present

## 2022-02-20 DIAGNOSIS — Z801 Family history of malignant neoplasm of trachea, bronchus and lung: Secondary | ICD-10-CM | POA: Diagnosis not present

## 2022-02-20 DIAGNOSIS — Z8249 Family history of ischemic heart disease and other diseases of the circulatory system: Secondary | ICD-10-CM | POA: Diagnosis not present

## 2022-02-20 DIAGNOSIS — D649 Anemia, unspecified: Secondary | ICD-10-CM | POA: Diagnosis present

## 2022-02-20 DIAGNOSIS — E876 Hypokalemia: Secondary | ICD-10-CM | POA: Diagnosis present

## 2022-02-20 DIAGNOSIS — Z91030 Bee allergy status: Secondary | ICD-10-CM | POA: Diagnosis not present

## 2022-02-20 DIAGNOSIS — K31811 Angiodysplasia of stomach and duodenum with bleeding: Secondary | ICD-10-CM | POA: Diagnosis present

## 2022-02-20 DIAGNOSIS — Z800 Family history of malignant neoplasm of digestive organs: Secondary | ICD-10-CM | POA: Diagnosis not present

## 2022-02-20 DIAGNOSIS — R5383 Other fatigue: Secondary | ICD-10-CM | POA: Diagnosis present

## 2022-02-20 DIAGNOSIS — Z7901 Long term (current) use of anticoagulants: Secondary | ICD-10-CM | POA: Diagnosis not present

## 2022-02-20 DIAGNOSIS — G2581 Restless legs syndrome: Secondary | ICD-10-CM | POA: Diagnosis present

## 2022-02-20 DIAGNOSIS — R531 Weakness: Secondary | ICD-10-CM

## 2022-02-20 DIAGNOSIS — R0602 Shortness of breath: Secondary | ICD-10-CM | POA: Diagnosis not present

## 2022-02-20 DIAGNOSIS — R0609 Other forms of dyspnea: Secondary | ICD-10-CM

## 2022-02-20 DIAGNOSIS — N938 Other specified abnormal uterine and vaginal bleeding: Secondary | ICD-10-CM | POA: Diagnosis present

## 2022-02-20 DIAGNOSIS — R7989 Other specified abnormal findings of blood chemistry: Secondary | ICD-10-CM | POA: Diagnosis present

## 2022-02-20 DIAGNOSIS — I110 Hypertensive heart disease with heart failure: Secondary | ICD-10-CM | POA: Diagnosis present

## 2022-02-20 DIAGNOSIS — Z888 Allergy status to other drugs, medicaments and biological substances status: Secondary | ICD-10-CM | POA: Diagnosis not present

## 2022-02-20 DIAGNOSIS — E7800 Pure hypercholesterolemia, unspecified: Secondary | ICD-10-CM | POA: Diagnosis present

## 2022-02-20 HISTORY — PX: COLONOSCOPY WITH PROPOFOL: SHX5780

## 2022-02-20 HISTORY — PX: ESOPHAGOGASTRODUODENOSCOPY (EGD) WITH PROPOFOL: SHX5813

## 2022-02-20 HISTORY — PX: HOT HEMOSTASIS: SHX5433

## 2022-02-20 LAB — ECHOCARDIOGRAM COMPLETE
AR max vel: 2.92 cm2
AV Peak grad: 5.3 mmHg
Ao pk vel: 1.15 m/s
Area-P 1/2: 3.72 cm2
Calc EF: 67.3 %
Height: 62 in
S' Lateral: 3.1 cm
Single Plane A2C EF: 66.3 %
Single Plane A4C EF: 66.6 %
Weight: 3984 oz

## 2022-02-20 LAB — CBC
HCT: 32 % — ABNORMAL LOW (ref 36.0–46.0)
Hemoglobin: 9.2 g/dL — ABNORMAL LOW (ref 12.0–15.0)
MCH: 22.7 pg — ABNORMAL LOW (ref 26.0–34.0)
MCHC: 28.8 g/dL — ABNORMAL LOW (ref 30.0–36.0)
MCV: 79 fL — ABNORMAL LOW (ref 80.0–100.0)
Platelets: 254 10*3/uL (ref 150–400)
RBC: 4.05 MIL/uL (ref 3.87–5.11)
RDW: 18.2 % — ABNORMAL HIGH (ref 11.5–15.5)
WBC: 6 10*3/uL (ref 4.0–10.5)
nRBC: 0 % (ref 0.0–0.2)

## 2022-02-20 LAB — BASIC METABOLIC PANEL
Anion gap: 8 (ref 5–15)
BUN: 17 mg/dL (ref 8–23)
CO2: 28 mmol/L (ref 22–32)
Calcium: 8.9 mg/dL (ref 8.9–10.3)
Chloride: 108 mmol/L (ref 98–111)
Creatinine, Ser: 0.92 mg/dL (ref 0.44–1.00)
GFR, Estimated: >60 mL/min (ref >60)
Glucose, Bld: 109 mg/dL — ABNORMAL HIGH (ref 70–99)
Potassium: 3.8 mmol/L (ref 3.5–5.1)
Sodium: 144 mmol/L (ref 135–145)

## 2022-02-20 SURGERY — ESOPHAGOGASTRODUODENOSCOPY (EGD) WITH PROPOFOL
Anesthesia: Monitor Anesthesia Care

## 2022-02-20 MED ORDER — APIXABAN 5 MG PO TABS: 5.0000 mg | ORAL_TABLET | Freq: Two times a day (BID) | ORAL | 1 refills | Status: DC

## 2022-02-20 MED ORDER — PROPOFOL 10 MG/ML IV BOLUS
INTRAVENOUS | Status: DC | PRN
  Administered 2022-02-20 (×2): 20 mg via INTRAVENOUS

## 2022-02-20 MED ORDER — LACTATED RINGERS IV SOLN: INTRAVENOUS | Status: DC | PRN

## 2022-02-20 MED ORDER — EPHEDRINE SULFATE-NACL 50-0.9 MG/10ML-% IV SOSY
PREFILLED_SYRINGE | INTRAVENOUS | Status: DC | PRN
  Administered 2022-02-20: 10 mg via INTRAVENOUS

## 2022-02-20 MED ORDER — PERFLUTREN LIPID MICROSPHERE
1.0000 mL | INTRAVENOUS | Status: AC | PRN
  Administered 2022-02-20: 2 mL via INTRAVENOUS

## 2022-02-20 MED ORDER — FERROUS SULFATE 325 (65 FE) MG PO TABS: 325.0000 mg | ORAL_TABLET | Freq: Every day | ORAL | 1 refills | Status: AC

## 2022-02-20 MED ORDER — PROPOFOL 500 MG/50ML IV EMUL
INTRAVENOUS | Status: DC | PRN
  Administered 2022-02-20: 125 ug/kg/min via INTRAVENOUS

## 2022-02-20 SURGICAL SUPPLY — 25 items

## 2022-02-20 NOTE — Transfer of Care (Signed)
Immediate Anesthesia Transfer of Care Note  Patient: Tara Collier  Procedure(s) Performed: ESOPHAGOGASTRODUODENOSCOPY (EGD) WITH PROPOFOL COLONOSCOPY WITH PROPOFOL HOT HEMOSTASIS (ARGON PLASMA COAGULATION/BICAP)  Patient Location: PACU  Anesthesia Type:MAC  Level of Consciousness: drowsy and patient cooperative  Airway & Oxygen Therapy: Patient Spontanous Breathing  Post-op Assessment: Report given to RN and Post -op Vital signs reviewed and stable  Post vital signs: Reviewed and stable  Last Vitals:  Vitals Value Taken Time  BP 84/56 02/20/22 1132  Temp    Pulse 77 02/20/22 1134  Resp 18 02/20/22 1134  SpO2 97 % 02/20/22 1134  Vitals shown include unvalidated device data.  Last Pain:  Vitals:   02/20/22 1000  TempSrc: Tympanic  PainSc: 0-No pain         Complications: No notable events documented.

## 2022-02-20 NOTE — Discharge Summary (Signed)
Physician Discharge Summary  Portland [name] [id number] DOB: 1946-01-19 DOA: 02/18/2022  PCP: Inda Coke, PA  Admit date: 02/18/2022 Discharge date: 02/20/2022  Admitted From: Home Disposition:  Home  Discharge Condition:Stable CODE STATUS:FULL Diet recommendation: Heart Healthy   Brief/Interim Summary:  Patient is a 76 year old female with history of permanent A-fib on Eliquis, chronic diastolic CHF, hypertension who presented for the evaluation of shortness of breath, fatigue.  Her PCP did lab work at the office and was found to have low hemoglobin and she was sent to the ED.  She also reported bilateral lower extremity edema, orthopnea.  Patient also reported blood on the toilet per when she wiped a few days ago, no history of hematochezia or melena.  On presentation her hemoglobin was in the range of 7.  Patient was given at unit of blood transfusion, started on IV Lasix.  GI consulted, underwent EGD and colonoscopy with finding of bleeding angiodysplastic lesion in the duodenum, treated with APC.  Hemodynamically stable for discharge today.  Following problems were addressed during her hospitalization:  Normocytic anemia: Her hemoglobin was in the range of 11 about 10 months ago.  Hemoglobin has dropped to the range of 7.  Given a unit of transfusion, hemoglobin this morning in the range of 9. She was evaluated by GI in the past and there was a plan for colonoscopy.  Though FOBT was negative and she declines hematochezia or melena, we requested GI consultation for further work-up for this unexplainable anemia.  Iron panel shows normal iron level.underwent EGD and colonoscopy with finding of bleeding angiodysplastic lesion in the duodenum, treated with APC.  Continue iron supplementation on discharge.  Vaginal/uterine bleeding: Saw some of blood in the toilet paper on wiping.  Ultrasound of the pelvis does not show any significant finding with fibroids or endometrial thickening but it  was a poor study .  We recommend to follow-up as an outpatient with gynecology.   Acute on chronic diastolic congestive heart failure: Presented with bilateral lower EXTR edema, orthopnea, weakness.  Found to be volume overloaded on presentation.  Elevated BNP.  Chest CT showed vascular congestion.  Started on Lasix 20 mg IV every 12 .  Now appears euvolemic.  Continue Lasix at home dose.  Continue losartan   Permanent A-fib: Follows with cardiology.  Remains in A-fib.  Rate controlled with Coreg.   Hypertension: Continue current medication.  Monitor blood pressure.  Currently normotensive   Constipation: Continue bowel regimen   Morbid obesity: BMI 45.5   Hypokalemia: Supplemented with potassium   Debiltiy/deconditioning: Patient reported increased weakness, fatigue on ambulation.  We requested PT evaluation.   Discharge Diagnoses:  Principal Problem:   Symptomatic anemia Active Problems:   Atrial fibrillation (HCC)   HTN (hypertension)   Uterine bleeding   Acute on chronic diastolic CHF (congestive heart failure) (HCC)   Angiodysplasia of duodenum with hemorrhage   Weakness    Discharge Instructions  Discharge Instructions     Diet - low sodium heart healthy   Complete by: As directed    Discharge instructions   Complete by: As directed    1)Please take prescribed medications as instructed 2)Follow up with your PCP in a week. Do a CBC test during the follow up to check your hemoglobin 3)Start taking Eliquis only from tomorrow.   Increase activity slowly   Complete by: As directed       Allergies as of 02/20/2022       Reactions   Tape Rash  USE PAPER TAPE ONLY!!!   Bee Venom Rash   Norvasc [amlodipine] Swelling        Medication List     STOP taking these medications    cephALEXin 500 MG capsule Commonly known as: KEFLEX       TAKE these medications    acetaminophen 500 MG tablet Commonly known as: TYLENOL Take 500 mg by mouth every 6 (six)  hours as needed for mild pain or moderate pain.   acetaminophen 325 MG tablet Commonly known as: TYLENOL Take 650 mg by mouth every 6 (six) hours as needed for moderate pain or headache.   apixaban 5 MG Tabs tablet Commonly known as: Eliquis Take 1 tablet (5 mg total) by mouth 2 (two) times daily. Start taking tomorrow What changed: additional instructions   atorvastatin 40 MG tablet Commonly known as: LIPITOR TAKE 1 TABLET BY MOUTH EVERY DAY   carvedilol 12.5 MG tablet Commonly known as: COREG Take 1 tablet (12.5 mg total) by mouth 2 (two) times daily with a meal.   diclofenac Sodium 1 % Gel Commonly known as: VOLTAREN Apply 2 g topically 4 (four) times daily as needed (knee pain).   ferrous sulfate 325 (65 FE) MG tablet Take 1 tablet (325 mg total) by mouth daily.   furosemide 20 MG tablet Commonly known as: LASIX Take 1 tablet (20 mg total) by mouth daily.   gabapentin 100 MG capsule Commonly known as: Neurontin Take 1 capsule (100 mg total) by mouth at bedtime.   losartan 100 MG tablet Commonly known as: COZAAR TAKE 1 TABLET BY MOUTH EVERY DAY   meclizine 25 MG tablet Commonly known as: ANTIVERT Take 1 tablet (25 mg total) by mouth 3 (three) times daily as needed for dizziness.   MIRABEGRON ER PO Take 1 tablet by mouth daily. Pt given samples at doctors office unsure of dose   polyethylene glycol powder 17 GM/SCOOP powder Commonly known as: GLYCOLAX/MIRALAX Take 17 g by mouth daily as needed for mild constipation.   senna 8.6 MG Tabs tablet Commonly known as: SENOKOT Take 2 tablets by mouth at bedtime as needed for mild constipation.        Follow-up Information     Jarold Motto, [place]. Schedule an appointment as soon as possible for a visit in 1 week(s).   Specialty: Physician Assistant Contact information: [address] [place] [address] [phone]                Allergies  Allergen Reactions   Tape Rash    USE PAPER TAPE  ONLY!!!   Bee Venom Rash   Norvasc [Amlodipine] Swelling    Consultations: GI   Procedures/Studies: ECHOCARDIOGRAM COMPLETE  Result Date: 02/20/2022    ECHOCARDIOGRAM REPORT   Patient Name:   Tara Collier Date of Exam: 02/20/2022 Medical Rec #:  [id number]    Height:       62.0 in Accession #:    [id number]   Weight:       249.0 lb Date of Birth:  [birth year]    BSA:          2.098 m Patient Age:    75 years     BP:           136/48 mmHg Patient Gender: F            HR:           70 bpm. Exam Location:  Inpatient Procedure: 2D Echo, Cardiac Doppler, Color Doppler and  Intracardiac            Opacification Agent Indications:    Dyspnea  History:        Patient has prior history of Echocardiogram examinations.                 Arrythmias:Atrial Fibrillation; Risk Factors:Hypertension.  Sonographer:    Jyl Heinz Referring Phys: [id number] Chesnee Floren  Sonographer Comments: Image acquisition challenging due to patient body habitus. IMPRESSIONS  1. Left ventricular ejection fraction, by estimation, is 60 to 65%. The left ventricle has normal function. The left ventricle has no regional wall motion abnormalities. Left ventricular diastolic function could not be evaluated.  2. Right ventricular systolic function is normal. The right ventricular size is normal.  3. Left atrial size was mildly dilated.  4. Right atrial size was mildly dilated.  5. The mitral valve is normal in structure. Trivial mitral valve regurgitation. No evidence of mitral stenosis.  6. The aortic valve is tricuspid. Aortic valve regurgitation is not visualized. Aortic valve sclerosis/calcification is present, without any evidence of aortic stenosis. Aortic valve Vmax measures 1.15 m/s.  7. The inferior vena cava is normal in size with greater than 50% respiratory variability, suggesting right atrial pressure of 3 mmHg. FINDINGS  Left Ventricle: Left ventricular ejection fraction, by estimation, is 60 to 65%. The left ventricle has normal  function. The left ventricle has no regional wall motion abnormalities. Definity contrast agent was given IV to delineate the left ventricular  endocardial borders. The left ventricular internal cavity size was normal in size. There is no left ventricular hypertrophy. Left ventricular diastolic function could not be evaluated due to atrial fibrillation. Left ventricular diastolic function could  not be evaluated. Right Ventricle: The right ventricular size is normal. No increase in right ventricular wall thickness. Right ventricular systolic function is normal. Left Atrium: Left atrial size was mildly dilated. Right Atrium: Right atrial size was mildly dilated. Pericardium: There is no evidence of pericardial effusion. Mitral Valve: The mitral valve is normal in structure. Mild mitral annular calcification. Trivial mitral valve regurgitation. No evidence of mitral valve stenosis. Tricuspid Valve: The tricuspid valve is normal in structure. Tricuspid valve regurgitation is trivial. No evidence of tricuspid stenosis. Aortic Valve: The aortic valve is tricuspid. Aortic valve regurgitation is not visualized. Aortic valve sclerosis/calcification is present, without any evidence of aortic stenosis. Aortic valve peak gradient measures 5.3 mmHg. Pulmonic Valve: The pulmonic valve was normal in structure. Pulmonic valve regurgitation is trivial. No evidence of pulmonic stenosis. Aorta: The aortic root is normal in size and structure. Venous: The inferior vena cava is normal in size with greater than 50% respiratory variability, suggesting right atrial pressure of 3 mmHg. IAS/Shunts: No atrial level shunt detected by color flow Doppler.  LEFT VENTRICLE PLAX 2D LVIDd:         4.70 cm      Diastology LVIDs:         3.10 cm      LV e' medial:    8.81 cm/s LV PW:         1.00 cm      LV E/e' medial:  12.3 LV IVS:        0.90 cm      LV e' lateral:   9.90 cm/s LVOT diam:     2.00 cm      LV E/e' lateral: 10.9 LV SV:         62 LV  SV Index:  29 LVOT Area:     3.14 cm  LV Volumes (MOD) LV vol d, MOD A2C: 119.0 ml LV vol d, MOD A4C: 106.0 ml LV vol s, MOD A2C: 40.1 ml LV vol s, MOD A4C: 35.4 ml LV SV MOD A2C:     78.9 ml LV SV MOD A4C:     106.0 ml LV SV MOD BP:      77.8 ml RIGHT VENTRICLE             IVC RV Basal diam:  3.60 cm     IVC diam: 1.60 cm RV Mid diam:    3.10 cm RV S prime:     11.90 cm/s TAPSE (M-mode): 1.7 cm LEFT ATRIUM             Index        RIGHT ATRIUM           Index LA diam:        4.80 cm 2.29 cm/m   RA Area:     22.50 cm LA Vol (A2C):   81.2 ml 38.70 ml/m  RA Volume:   66.70 ml  31.79 ml/m LA Vol (A4C):   81.3 ml 38.75 ml/m LA Biplane Vol: 82.0 ml 39.08 ml/m  AORTIC VALVE AV Area (Vmax): 2.92 cm AV Vmax:        115.00 cm/s AV Peak Grad:   5.3 mmHg LVOT Vmax:      107.00 cm/s LVOT Vmean:     75.700 cm/s LVOT VTI:       0.197 m  AORTA Ao Root diam: 2.80 cm Ao Asc diam:  3.10 cm MITRAL VALVE                TRICUSPID VALVE MV Area (PHT): 3.72 cm     TR Peak grad:   40.2 mmHg MV Decel Time: 204 msec     TR Vmax:        317.00 cm/s MV E velocity: 108.00 cm/s                             SHUNTS                             Systemic VTI:  0.20 m                             Systemic Diam: 2.00 cm Fransico Him MD Electronically signed by Fransico Him MD Signature Date/Time: 02/20/2022/10:17:51 AM    Final    US Abdomen Complete  Result Date: 02/19/2022 CLINICAL DATA:  Hepatomegaly. EXAM: ABDOMEN ULTRASOUND COMPLETE COMPARISON:  None Available. FINDINGS: Gallbladder: No gallstones or wall thickening visualized. No sonographic Murphy sign noted by sonographer. Common bile duct: Diameter: 3.3 mm Liver: No focal lesion identified. Within normal limits in parenchymal echogenicity. Portal vein is patent on color Doppler imaging with normal direction of blood flow towards the liver. IVC: No abnormality visualized. Pancreas: Visualized portion unremarkable. Spleen: Size and appearance within normal limits. Right Kidney:  Length: 9.7 cm. Echogenicity within normal limits. No mass or hydronephrosis visualized. Left Kidney: Length: 8.6 cm. Echogenicity within normal limits. No mass or hydronephrosis visualized. Abdominal aorta: No aneurysm visualized. Mid and distal aorta are not well seen secondary to overlying bowel gas. Other findings: None. IMPRESSION: 1. No acute abnormality. 2. Limited evaluation of the abdominal aorta  and pancreas secondary to overlying bowel gas. Electronically Signed   By: Ronney Asters M.D.   On: 02/19/2022 22:03   US PELVIC COMPLETE WITH TRANSVAGINAL  Result Date: 02/18/2022 CLINICAL DATA:  Postmenopausal uterine bleeding. EXAM: TRANSABDOMINAL AND TRANSVAGINAL ULTRASOUND OF PELVIS TECHNIQUE: Both transabdominal and transvaginal ultrasound examinations of the pelvis were performed. Transabdominal technique was performed for global imaging of the pelvis including uterus, ovaries, adnexal regions, and pelvic cul-de-sac. It was necessary to proceed with endovaginal exam following the transabdominal exam to visualize the uterus endometrium and adnexa. COMPARISON:  Pelvis radiograph 01/30/2021 FINDINGS: Uterus Measurements: 6.7 x 3.5 x 3.1 cm = volume: 38 mL. The uterus is poorly defined. Multiple echogenic shadowing structures in the uterus typical of calcifications. This likely represents of poorly defined fibroid when compared with prior pelvic radiograph, however is not well delineated on the current exam. Endometrium Not well delineated. Right ovary Not seen.  No adnexal mass. Left ovary Not seen.  No adnexal mass Other findings No abnormal free fluid. IMPRESSION: 1. Limited exam. 2. Uterine calcifications, with rounded calcifications on prior pelvic radiograph typical of calcified fibroids. The uterus and presumed fibroids are not well delineated on the current exam. 3. The endometrium is not well defined, thickness cannot be measured. Given endometrial thickness could not be defined, further workup  recommendations connect 3 provided based on the current exam. Recommend gyn consultation for consideration of endometrial sampling or further workup. 4. Neither ovary is seen.  No adnexal mass Electronically Signed   By: Keith Rake M.D.   On: 02/18/2022 23:46   DG Chest 2 View  Result Date: 02/18/2022 CLINICAL DATA:  Shortness of breath EXAM: CHEST - 2 VIEW COMPARISON:  04/12/2021 FINDINGS: Cardiomegaly, vascular congestion. No overt edema. No confluent opacities or effusions. No acute bony abnormality. IMPRESSION: Cardiomegaly, vascular congestion. Electronically Signed   By: Rolm Baptise M.D.   On: 02/18/2022 18:11      Subjective: Patient Seen and examined the bedside this morning.  Hemodynamically stable.  Medically stable for discharge today  Discharge Exam: Vitals:   02/20/22 1145 02/20/22 1200  BP: (!) 116/43 (!) 123/53  Pulse: 69 69  Resp: 15 16  Temp:  98.1 F (36.7 C)  SpO2: 96% 95%   Vitals:   02/20/22 1132 02/20/22 1134 02/20/22 1145 02/20/22 1200  BP: (!) 84/56 (!) 99/56 (!) 116/43 (!) 123/53  Pulse: 78 77 69 69  Resp: 18 18 15 16   Temp: 98.9 F (37.2 C)   98.1 F (36.7 C)  TempSrc:      SpO2: 94% 97% 96% 95%  Weight:      Height:        General: Pt is alert, awake, not in acute distress Cardiovascular: RRR, S1/S2 +, no rubs, no gallops Respiratory: CTA bilaterally, no wheezing, no rhonchi Abdominal: Soft, NT, ND, bowel sounds + Extremities: no edema, no cyanosis    The results of significant diagnostics from this hospitalization (including imaging, microbiology, ancillary and laboratory) are listed below for reference.     Microbiology: No results found for this or any previous visit (from the past 240 hour(s)).   Labs: BNP (last 3 results) Recent Labs    12/25/21 1530 02/18/22 1753  BNP 646.3* [id number]*   Basic Metabolic Panel: Recent Labs  Lab 02/18/22 1530 02/18/22 2054 02/19/22 0343 02/20/22 0359  NA 138 140 140 144  K 3.9 3.7 3.4*  3.8  CL 104  --  105 108  CO2 24  --  27 28  GLUCOSE 119*  --  101* 109*  BUN 29*  --  25* 17  CREATININE 1.00  --  0.80 0.92  CALCIUM 8.6*  --  8.6* 8.9   Liver Function Tests: Recent Labs  Lab 02/18/22 1530  AST 31  ALT 27  ALKPHOS 119  BILITOT 1.2  PROT 6.1*  ALBUMIN 3.5   No results for input(s): "LIPASE", "AMYLASE" in the last 168 hours. No results for input(s): "AMMONIA" in the last 168 hours. CBC: Recent Labs  Lab 02/18/22 1051 02/18/22 1530 02/18/22 2054 02/19/22 0343 02/20/22 0359  WBC 4.6 6.2  --  6.7 6.0  NEUTROABS 3.3  --   --   --   --   HGB 7.6 Repeated and verified X2.* 7.8* 8.5* 8.4* 9.2*  HCT 25.4* 27.3* 25.0* 29.0* 32.0*  MCV 74.0* 80.3  --  79.2* 79.0*  PLT 224.0 258  --  223 254   Cardiac Enzymes: No results for input(s): "CKTOTAL", "CKMB", "CKMBINDEX", "TROPONINI" in the last 168 hours. BNP: Invalid input(s): "POCBNP" CBG: No results for input(s): "GLUCAP" in the last 168 hours. D-Dimer No results for input(s): "DDIMER" in the last 72 hours. Hgb A1c No results for input(s): "HGBA1C" in the last 72 hours. Lipid Profile No results for input(s): "CHOL", "HDL", "LDLCALC", "TRIG", "CHOLHDL", "LDLDIRECT" in the last 72 hours. Thyroid function studies No results for input(s): "TSH", "T4TOTAL", "T3FREE", "THYROIDAB" in the last 72 hours.  Invalid input(s): "FREET3" Anemia work up Recent Labs    02/18/22 2048 02/19/22 0343  VITAMINB12  --  394  FOLATE  --  17.4  TIBC 517*  --   IRON 38  --    Urinalysis    Component Value Date/Time   COLORURINE STRAW (A) 02/18/2022 2045   APPEARANCEUR CLEAR 02/18/2022 2045   LABSPEC 1.008 02/18/2022 2045   PHURINE 6.0 02/18/2022 2045   GLUCOSEU NEGATIVE 02/18/2022 2045   HGBUR NEGATIVE 02/18/2022 2045   BILIRUBINUR NEGATIVE 02/18/2022 2045   KETONESUR NEGATIVE 02/18/2022 2045   PROTEINUR NEGATIVE 02/18/2022 2045   UROBILINOGEN 0.2 07/02/2016 1500   NITRITE NEGATIVE 02/18/2022 2045   LEUKOCYTESUR  NEGATIVE 02/18/2022 2045   Sepsis Labs Recent Labs  Lab 02/18/22 1051 02/18/22 1530 02/19/22 0343 02/20/22 0359  WBC 4.6 6.2 6.7 6.0   Microbiology No results found for this or any previous visit (from the past 240 hour(s)).  Please note: You were cared for by a hospitalist during your hospital stay. Once you are discharged, your primary care physician will handle any further medical issues. Please note that NO REFILLS for any discharge medications will be authorized once you are discharged, as it is imperative that you return to your primary care physician (or establish a relationship with a primary care physician if you do not have one) for your post hospital discharge needs so that they can reassess your need for medications and monitor your lab values.    Time coordinating discharge: 40 minutes  SIGNED:   Burnadette Pop, MD  [facility] 02/20/2022, 1:27 PM Pager [phone]  If 7PM-7AM, please contact night-coverage [email or web address] Password [email or web address]

## 2022-02-20 NOTE — Op Note (Signed)
[facility] Patient Name: Tara Collier Procedure Date : 02/20/2022 MRN: [id number] Attending MD: Wilhemina Bonito. Marina Goodell , MD Date of Birth: 1946/04/11 CSN: [id number] Age: 76 Admit Type: Inpatient Procedure:                Colonoscopy Indications:              Iron deficiency anemia Providers:                Wilhemina Bonito. Marina Goodell, MD, Vicki Mallet, RN, [facility]                            Technician, Technician Referring MD:             [facility] Medicines:                Monitored Anesthesia Care Complications:            No immediate complications. Estimated blood loss:                            None. Estimated Blood Loss:     Estimated blood loss: none. Procedure:                Pre-Anesthesia Assessment:                           - Prior to the procedure, a History and Physical                            was performed, and patient medications and                            allergies were reviewed. The patient's tolerance of                            previous anesthesia was also reviewed. The risks                            and benefits of the procedure and the sedation                            options and risks were discussed with the patient.                            All questions were answered, and informed consent                            was obtained. Prior Anticoagulants: The patient has                            taken Eliquis (apixaban), last dose was 2 days                            prior to procedure. ASA Grade Assessment: III - A  patient with severe systemic disease. After                            reviewing the risks and benefits, the patient was                            deemed in satisfactory condition to undergo the                            procedure.                           After obtaining informed consent, the colonoscope                            was passed under direct vision. Throughout the                             procedure, the patient's blood pressure, pulse, and                            oxygen saturations were monitored continuously. The                            [id number]-HQ190L ([id number]) Olympus coloscope was                            introduced through the anus and advanced to the the                            cecum, identified by appendiceal orifice and                            ileocecal valve. The ileocecal valve, appendiceal                            orifice, and rectum were photographed. The quality                            of the bowel preparation was excellent. The                            colonoscopy was performed without difficulty. The                            patient tolerated the procedure well. The bowel                            preparation used was MoviPrep via split dose                            instruction. Scope In: 10:59:01 AM Scope Out: 11:11:26 AM Scope Withdrawal Time: 0 hours 6 minutes 58 seconds  Total Procedure Duration: 0 hours 12 minutes 25 seconds  Findings:  Internal hemorrhoids were found during retroflexion. The hemorrhoids       were moderate.      The exam was otherwise without abnormality on direct and retroflexion       views. Impression:               - Internal hemorrhoids.                           - The examination was otherwise normal on direct                            and retroflexion views.                           - No specimens collected. Recommendation:           - Repeat colonoscopy is not recommended for                            screening purposes.                           - Resume Eliquis (apixaban) tomorrow at prior dose.                           - Patient has a contact number available for                            emergencies. The signs and symptoms of potential                            delayed complications were discussed with the                            patient. Return to normal activities tomorrow.                             Written discharge instructions were provided to the                            patient.                           - Resume previous diet.                           - Continue present medications.                           ?" EGD today. Please see report Procedure Code(s):        --- Professional ---                           [phone], Colonoscopy, flexible; diagnostic, including                            collection of specimen(s) by brushing or washing,  when performed (separate procedure) Diagnosis Code(s):        --- Professional ---                           K64.8, Other hemorrhoids                           D50.9, Iron deficiency anemia, unspecified CPT copyright 2019 [employer]. All rights reserved. The codes documented in this report are preliminary and upon coder review may  be revised to meet current compliance requirements. Wilhemina Bonito. Marina Goodell, MD 02/20/2022 11:29:01 AM This report has been signed electronically. Number of Addenda: 0

## 2022-02-20 NOTE — Anesthesia Preprocedure Evaluation (Signed)
Anesthesia Evaluation  Patient identified by MRN, date of birth, ID band Patient awake    Reviewed: Allergy & Precautions, H&P , NPO status , Patient's Chart, lab work & pertinent test results  Airway Mallampati: II   Neck ROM: full    Dental   Pulmonary neg pulmonary ROS,    breath sounds clear to auscultation       Cardiovascular hypertension, + dysrhythmias Atrial Fibrillation  Rhythm:irregular Rate:Normal     Neuro/Psych    GI/Hepatic GI bleeding   Endo/Other    Renal/GU      Musculoskeletal  (+) Arthritis ,   Abdominal   Peds  Hematology  (+) Blood dyscrasia, anemia ,   Anesthesia Other Findings   Reproductive/Obstetrics                             Anesthesia Physical Anesthesia Plan  ASA: 3  Anesthesia Plan: MAC   Post-op Pain Management:    Induction: Intravenous  PONV Risk Score and Plan: 2 and Propofol infusion and Treatment may vary due to age or medical condition  Airway Management Planned: Nasal Cannula  Additional Equipment:   Intra-op Plan:   Post-operative Plan:   Informed Consent: I have reviewed the patients History and Physical, chart, labs and discussed the procedure including the risks, benefits and alternatives for the proposed anesthesia with the patient or authorized representative who has indicated his/her understanding and acceptance.     Dental advisory given  Plan Discussed with: CRNA, Anesthesiologist and Surgeon  Anesthesia Plan Comments:         Anesthesia Quick Evaluation

## 2022-02-20 NOTE — Evaluation (Signed)
Physical Therapy Evaluation Patient Details Name: Tara Collier MRN: [id number] DOB: [birth year] Today's Date: 02/20/2022  History of Present Illness  Pt is a 76 y/o female admitted secondary to anemia and SOB. Pt is s/p EGD and colonoscopy on 6/29. PMH includes a fib, dCHF, and HTN.  Clinical Impression  Pt admitted secondary to problem above with deficits below. Pt requiring min guard A for mobility tasks this session. Pt reporting mild wooziness, likely from medications given during EGD. Educated about using cane for increased stability. Also recommended talking to primary care MD about outpatient PT if she feels she needs when she is discharged. Will continue to follow acutely.        Recommendations for follow up therapy are one component of a multi-disciplinary discharge planning process, led by the attending physician.  Recommendations may be updated based on patient status, additional functional criteria and insurance authorization.  Follow Up Recommendations Other (comment) (outpatient PT once cleared by MD)      Assistance Recommended at Discharge Intermittent Supervision/Assistance  Patient can return home with the following  Assistance with cooking/housework;Assist for transportation;Help with stairs or ramp for entrance;A little help with bathing/dressing/bathroom    Equipment Recommendations None recommended by PT  Recommendations for Other Services       Functional Status Assessment Patient has had a recent decline in their functional status and demonstrates the ability to make significant improvements in function in a reasonable and predictable amount of time.     Precautions / Restrictions Precautions Precautions: Fall Restrictions Weight Bearing Restrictions: No      Mobility  Bed Mobility Overal bed mobility: Modified Independent                  Transfers Overall transfer level: Needs assistance Equipment used: None Transfers: Sit to/from Stand Sit  to Stand: Min guard           General transfer comment: Min guard for safety    Ambulation/Gait Ambulation/Gait assistance: Min guard Gait Distance (Feet): 50 Feet Assistive device: 1 person hand held assist Gait Pattern/deviations: Step-through pattern, Decreased stride length Gait velocity: Decreased     General Gait Details: Pt reports breathing has improved during mobility. Mild wooziness and instability, likely from meds given during EGD and colonoscopy. Educated about using cane for increased support  Information systems manager Rankin (Stroke Patients Only)       Balance Overall balance assessment: Mild deficits observed, not formally tested                                           Pertinent Vitals/Pain Pain Assessment Pain Assessment: No/denies pain    Home Living Family/patient expects to be discharged to:: Private residence Living Arrangements: Alone Available Help at Discharge: Family;Available PRN/intermittently Type of Home: House Home Access: Stairs to enter Entrance Stairs-Rails: Doctor, general practice of Steps: 4   Home Layout: One level Home Equipment: Cane - quad      Prior Function Prior Level of Function : Independent/Modified Independent                     Hand Dominance        Extremity/Trunk Assessment   Upper Extremity Assessment Upper Extremity Assessment: Overall WFL for tasks assessed    Lower Extremity Assessment Lower  Extremity Assessment: Generalized weakness    Cervical / Trunk Assessment Cervical / Trunk Assessment: Normal  Communication   Communication: No difficulties  Cognition Arousal/Alertness: Suspect due to medications Behavior During Therapy: WFL for tasks assessed/performed Overall Cognitive Status: Within Functional Limits for tasks assessed                                          General Comments      Exercises      Assessment/Plan    PT Assessment Patient needs continued PT services  PT Problem List Decreased strength;Decreased balance;Decreased mobility;Decreased activity tolerance;Decreased knowledge of use of DME;Decreased knowledge of precautions       PT Treatment Interventions DME instruction;Gait training;Stair training;Functional mobility training;Therapeutic activities;Therapeutic exercise;Balance training;Patient/family education    PT Goals (Current goals can be found in the Care Plan section)  Acute Rehab PT Goals Patient Stated Goal: to go home PT Goal Formulation: With patient Time For Goal Achievement: 03/06/22 Potential to Achieve Goals: Good    Frequency Min 3X/week     Co-evaluation               AM-PAC PT "6 Clicks" Mobility  Outcome Measure Help needed turning from your back to your side while in a flat bed without using bedrails?: None Help needed moving from lying on your back to sitting on the side of a flat bed without using bedrails?: None Help needed moving to and from a bed to a chair (including a wheelchair)?: A Little Help needed standing up from a chair using your arms (e.g., wheelchair or bedside chair)?: A Little Help needed to walk in hospital room?: A Little Help needed climbing 3-5 steps with a railing? : A Lot 6 Click Score: 19    End of Session Equipment Utilized During Treatment: Gait belt Activity Tolerance: Patient tolerated treatment well Patient left: in chair;with call bell/phone within reach;with family/visitor present Nurse Communication: Mobility status PT Visit Diagnosis: Unsteadiness on feet (R26.81);Muscle weakness (generalized) (M62.81)    Time: 8676-7209 PT Time Calculation (min) (ACUTE ONLY): 24 min   Charges:   PT Evaluation $PT Eval Low Complexity: 1 Low PT Treatments $Gait Training: 8-22 mins        Cindee Salt, DPT  Acute Rehabilitation Services  Office: [phone]   Lehman Prom 02/20/2022, 2:45 PM

## 2022-02-20 NOTE — Progress Notes (Signed)
PT Cancellation Note  Patient Details Name: Tara Collier MRN: [id number] DOB: 1946-01-19   Cancelled Treatment:    Reason Eval/Treat Not Completed: Patient at procedure or test/unavailable (Pt in Endoscopy. Will return as able.)   Bevelyn Buckles 02/20/2022, 10:54 AM Emma-Lee Oddo M,PT Acute Rehab Services [phone]

## 2022-02-20 NOTE — Op Note (Signed)
[facility] Patient Name: Tara Collier Procedure Date : 02/20/2022 MRN: [id number] Attending MD: Wilhemina Bonito. Marina Goodell , MD Date of Birth: [birth year] CSN: [id number] Age: 76 Admit Type: Inpatient Procedure:                Upper GI endoscopy with control of bleeding Indications:              Iron deficiency anemia Providers:                Wilhemina Bonito. Marina Goodell, MD, Vicki Mallet, RN, [facility]                            Technician, Technician Referring MD:             [facility] hospitalist Medicines:                Monitored Anesthesia Care Complications:            No immediate complications. Estimated Blood Loss:     Estimated blood loss: none. Procedure:                Pre-Anesthesia Assessment:                           - Prior to the procedure, a History and Physical                            was performed, and patient medications and                            allergies were reviewed. The patient's tolerance of                            previous anesthesia was also reviewed. The risks                            and benefits of the procedure and the sedation                            options and risks were discussed with the patient.                            All questions were answered, and informed consent                            was obtained. Prior Anticoagulants: The patient has                            taken Eliquis (apixaban), last dose was 2 days                            prior to procedure. ASA Grade Assessment: III - A                            patient with severe systemic disease. After  reviewing the risks and benefits, the patient was                            deemed in satisfactory condition to undergo the                            procedure.                           After obtaining informed consent, the endoscope was                            passed under direct vision. Throughout the                            procedure, the  patient's blood pressure, pulse, and                            oxygen saturations were monitored continuously. The                            [id number] Olympus endoscope was introduced                            through the mouth, and advanced to the second part                            of duodenum. The upper GI endoscopy was                            accomplished without difficulty. The patient                            tolerated the procedure well. Scope In: Scope Out: Findings:      The esophagus was normal.      The stomach was normal. Small hiatal hernia present.      A single small angiodysplastic lesion with bleeding was found in the       second portion of the duodenum. Coagulation for hemostasis using argon       plasma was successful.      The examined duodenum was otherwise normal.      The cardia and gastric fundus were normal on retroflexion. Impression:               - Normal esophagus.                           - Normal stomach. Small hiatal hernia.                           - A single bleeding angiodysplastic lesion in the                            duodenum. Treated with argon plasma coagulation                            (  APC). This explains the patient's iron deficiency                            anemia.                           - Normal examined duodenum, otherwise.                           - No specimens collected. Recommendation:           1. Resume previous diet.                           2. Continue present medications.                           3. Resume Eliquis (apixaban) at prior dose tomorrow.                           4. This patient should stay on iron sulfate 325 mg                            DAILY INDEFINITELY to reduce the risk of recurrent                            iron deficiency anemia                           5. Okay for discharge from GI perspective                           6. Patient's PCP should monitor her blood counts                             and iron levels                           7. No outpatient GI follow-up required at this                            time. If GI outpatient services needed, the patient                            would follow-up with Dr. Candis Schatz                           Discussed with the patient. She was provided copies                            of her procedure reports. We will sign off. Procedure Code(s):        --- Professional ---                           [phone], Esophagogastroduodenoscopy, flexible,  transoral; with control of bleeding, any method Diagnosis Code(s):        --- Professional ---                           K31.811, Angiodysplasia of stomach and duodenum                            with bleeding                           D50.9, Iron deficiency anemia, unspecified CPT copyright 2019 [employer] Association. All rights reserved. The codes documented in this report are preliminary and upon coder review may  be revised to meet current compliance requirements. Wilhemina Bonito. Marina Goodell, MD 02/20/2022 11:39:08 AM This report has been signed electronically. Number of Addenda: 0

## 2022-02-20 NOTE — Interval H&P Note (Signed)
History and Physical Interval Note:  02/20/2022 10:07 AM  Tara Collier  has presented today for surgery, with the diagnosis of Chronic anemia.  Minor bleeding per rectum..  The various methods of treatment have been discussed with the patient and family. After consideration of risks, benefits and other options for treatment, the patient has consented to  Procedure(s): ESOPHAGOGASTRODUODENOSCOPY (EGD) WITH PROPOFOL (N/A) COLONOSCOPY WITH PROPOFOL (N/A) as a surgical intervention.  The patient's history has been reviewed, patient examined, no change in status, stable for surgery.  I have reviewed the patient's chart and labs.  Questions were answered to the patient's satisfaction.     Yancey Flemings

## 2022-02-20 NOTE — Interval H&P Note (Signed)
History and Physical Interval Note:  02/20/2022 10:06 AM  Tara Collier  has presented today for surgery, with the diagnosis of Chronic anemia.  Minor bleeding per rectum..  The various methods of treatment have been discussed with the patient and family. After consideration of risks, benefits and other options for treatment, the patient has consented to  Procedure(s): ESOPHAGOGASTRODUODENOSCOPY (EGD) WITH PROPOFOL (N/A) COLONOSCOPY WITH PROPOFOL (N/A) as a surgical intervention.  The patient's history has been reviewed, patient examined, no change in status, stable for surgery.  I have reviewed the patient's chart and labs.  Questions were answered to the patient's satisfaction.     Yancey Flemings

## 2022-02-20 NOTE — Progress Notes (Signed)
Heart Failure Navigator Progress Note  Assessed for Heart & Vascular TOC clinic readiness.  Patient does not meet criteria due to No Acute HF, Has a cardiology appointment with Dr. Lalla Brothers on 02/27/2022.Rhae Hammock, BSN, RN Heart Failure Teacher, adult education Only

## 2022-02-21 ENCOUNTER — Encounter (HOSPITAL_COMMUNITY): Payer: Self-pay | Admitting: Internal Medicine

## 2022-02-21 ENCOUNTER — Telehealth: Payer: Self-pay

## 2022-02-21 NOTE — Telephone Encounter (Signed)
Transition Care Management Follow-up Telephone Call Date of discharge and from where: [facility] 02/20/22 How have you been since you were released from the hospital? Doing better  Any questions or concerns? No  Items Reviewed: Did the pt receive and understand the discharge instructions provided? Yes  Medications obtained and verified? Yes  Other? No  Any new allergies since your discharge? No  Dietary orders reviewed? Yes Do you have support at home? Yes   Home Care and Equipment/Supplies: Were home health services ordered? not applicable If so, what is the name of the agency?   Has the agency set up a time to come to the patient's home? not applicable Were any new equipment or medical supplies ordered?  No What is the name of the medical supply agency?  Were you able to get the supplies/equipment? not applicable Do you have any questions related to the use of the equipment or supplies? No  Functional Questionnaire: (I = Independent and D = Dependent) ADLs: I  Bathing/Dressing- I  Meal Prep- I  Eating- I  Maintaining continence- D  Transferring/Ambulation- I  Managing Meds- I  Follow up appointments reviewed:  PCP Hospital f/u appt confirmed? No  PT DAUGHTER WILL CALL FOR APPT  Specialist Hospital f/u appt confirmed? Yes  Scheduled to see CVD on 02/27/22 @ 8:50. Are transportation arrangements needed? No  If their condition worsens, is the pt aware to call PCP or go to the Emergency Dept.? Yes Was the patient provided with contact information for the PCP's office or ED? Yes Was to pt encouraged to call back with questions or concerns? Yes

## 2022-02-21 NOTE — Anesthesia Postprocedure Evaluation (Signed)
Anesthesia Post Note  Patient: Kamrynn Melott  Procedure(s) Performed: ESOPHAGOGASTRODUODENOSCOPY (EGD) WITH PROPOFOL COLONOSCOPY WITH PROPOFOL HOT HEMOSTASIS (ARGON PLASMA COAGULATION/BICAP)     Patient location during evaluation: Endoscopy Anesthesia Type: MAC Level of consciousness: awake and alert Pain management: pain level controlled Vital Signs Assessment: post-procedure vital signs reviewed and stable Respiratory status: spontaneous breathing, nonlabored ventilation, respiratory function stable and patient connected to nasal cannula oxygen Cardiovascular status: stable and blood pressure returned to baseline Postop Assessment: no apparent nausea or vomiting Anesthetic complications: no   No notable events documented.  Last Vitals:  Vitals:   02/20/22 1145 02/20/22 1200  BP: (!) 116/43 (!) 123/53  Pulse: 69 69  Resp: 15 16  Temp:  36.7 C  SpO2: 96% 95%    Last Pain:  Vitals:   02/20/22 1200  TempSrc:   PainSc: 0-No pain                 Jennise Both S

## 2022-02-26 NOTE — Progress Notes (Unsigned)
Cardiology Office Note:    Date:  02/27/2022   ID:  Tara Collier, DOB 09-25-1945, MRN [id number]  PCP:  Tara Collier, [place] Cardiologist: Elouise Munroe, MD   Reason for visit: Follow-up  History of Present Illness:    Tara Collier is a 76 y.o. female with a hx of atrial fibrillation, hypertension hyperlipidemia.  She was initially diagnosed with atrial fibrillation and atrial flutter remotely and underwent flutter ablation in 2013. She has previously been on propafenone but she was lost to cardiology follow up and discontinued this on her own  She last saw Dr. Margaretann Loveless in June 2022.  She noted more palpitations and shortness of breath.  She was referred to the A-fib clinic for antiarrhythmics.  She was restarted amlodipine for blood pressure control.  She has follow-up with A-fib clinic.  Antiarrhythmics plus cardioversion vs. rate control was discussed.  Patient wanted to work on lifestyle modification.  She was referred to the [facility] exercise program and to sleep medicine for sleep apnea evaluation.   She last saw the A-fib clinic on Dec 25, 2021 and noted more shortness of breath with exertion and orthopnea.  She was started on Lasix 20 mg daily recommended to follow-up with cardiology.  Admitted to the hospital on February 18, 2022 with shortness of breath & fatigue.  Her hemoglobin was found to be 7.  She was given a unit of blood and started on IV Lasix.  EGD and colonoscopy with finding of bleeding angiodysplastic lesion in the duodenum, treated with APC.  Today, she states her weight is down from 256 pounds to 242 pounds post inpatient diuresis.  She is on Lasix 20 mg daily.  LE swelling resolved.  Though improved, she continues with dyspnea on exertion.  She has apneic episodes at night.  She has occasional heart pounding.  Denies chest pain.  She has chronic intermittent nausea and motion sickness.    Past Medical History:  Diagnosis Date   Arthritis    Atrial  fibrillation ([place]) 06/03/2013   Last Assessment & Plan:  Formatting of this note might be different from the original. Continue propafenone for rhythm control .Patient has clear symptomatic benefit with sinus rhythm maintenance. CHADS-Vasc score over 2, continue anticoagulation with eliquis.   HTN (hypertension) 06/03/2013   Last Assessment & Plan:  Formatting of this note might be different from the original. Hypertension is well controlled.  Continue current medical regimen and low sodium diet.  Reevaluate at next office visit   Hypercholesterolemia    Restless leg syndrome    Urinary incontinence 08/08/2020    Past Surgical History:  Procedure Laterality Date   CARDIOVERSION     COLONOSCOPY WITH PROPOFOL N/A 02/20/2022   Procedure: COLONOSCOPY WITH PROPOFOL;  Surgeon: Irene Shipper, MD;  Location: [place];  Service: Gastroenterology;  Laterality: N/A;   ESOPHAGOGASTRODUODENOSCOPY (EGD) WITH PROPOFOL N/A 02/20/2022   Procedure: ESOPHAGOGASTRODUODENOSCOPY (EGD) WITH PROPOFOL;  Surgeon: Irene Shipper, MD;  Location: [place];  Service: Gastroenterology;  Laterality: N/A;   HOT HEMOSTASIS N/A 02/20/2022   Procedure: HOT HEMOSTASIS (ARGON PLASMA COAGULATION/BICAP);  Surgeon: Irene Shipper, MD;  Location: [place];  Service: Gastroenterology;  Laterality: N/A;   RADIOFREQUENCY ABLATION     ROTATOR CUFF REPAIR     TUBAL LIGATION      Current Medications: Current Meds  Medication Sig   acetaminophen (TYLENOL) 325 MG tablet Take 650 mg by mouth every 6 (six) hours as needed for moderate pain  or headache.   acetaminophen (TYLENOL) 500 MG tablet Take 500 mg by mouth every 6 (six) hours as needed for mild pain or moderate pain.   apixaban (ELIQUIS) 5 MG TABS tablet Take 1 tablet (5 mg total) by mouth 2 (two) times daily. Start taking tomorrow   atorvastatin (LIPITOR) 40 MG tablet TAKE 1 TABLET BY MOUTH EVERY DAY (Patient taking differently: Take 40 mg by mouth daily.)   carvedilol  (COREG) 12.5 MG tablet Take 1 tablet (12.5 mg total) by mouth 2 (two) times daily with a meal.   diclofenac Sodium (VOLTAREN) 1 % GEL Apply 2 g topically 4 (four) times daily as needed (knee pain).   ferrous sulfate 325 (65 FE) MG tablet Take 1 tablet (325 mg total) by mouth daily.   furosemide (LASIX) 20 MG tablet Take 1 tablet (20 mg total) by mouth daily.   gabapentin (NEURONTIN) 100 MG capsule Take 1 capsule (100 mg total) by mouth at bedtime.   meclizine (ANTIVERT) 25 MG tablet Take 1 tablet (25 mg total) by mouth 3 (three) times daily as needed for dizziness.   MIRABEGRON ER PO Take 1 tablet by mouth daily. Pt given samples at doctors office unsure of dose   polyethylene glycol powder (GLYCOLAX/MIRALAX) 17 GM/SCOOP powder Take 17 g by mouth daily as needed for mild constipation.   senna (SENOKOT) 8.6 MG TABS tablet Take 2 tablets by mouth at bedtime as needed for mild constipation.   [DISCONTINUED] losartan (COZAAR) 100 MG tablet TAKE 1 TABLET BY MOUTH EVERY DAY (Patient taking differently: Take 100 mg by mouth daily.)     Allergies:   Tape, Bee venom, and Norvasc [amlodipine]   Social History   Socioeconomic History   Marital status: Single    Spouse name: Not on file   Number of children: Not on file   Years of education: Not on file   Highest education level: Not on file  Occupational History   Not on file  Tobacco Use   Smoking status: Never   Smokeless tobacco: Never  Substance and Sexual Activity   Alcohol use: No   Drug use: No   Sexual activity: Not on file  Other Topics Concern   Not on file  Social History Narrative   Daughter in [place]   Lives by herself   Worked for [employer] for 20+ years   Social Determinants of Health   Financial Resource Strain: Not on file  Food Insecurity: Not on file  Transportation Needs: Not on file  Physical Activity: Not on file  Stress: Not on file  Social Connections: Not on file     Family History: The patient's family  history includes Appendicitis in her father and mother; Heart disease in her father, maternal grandmother, and mother.  ROS:   Please see the history of present illness.     EKGs/Labs/Other Studies Reviewed:    EKG:  The ekg ordered today demonstrates atrial fibrillation with heart rate 72  Recent Labs: 02/18/2022: ALT 27; B Natriuretic Peptide 359.5 02/20/2022: BUN 17; Creatinine, Ser 0.92; Hemoglobin 9.2; Platelets 254; Potassium 3.8; Sodium 144   Recent Lipid Panel Lab Results  Component Value Date/Time   CHOL 236 (H) 08/08/2020 09:35 AM   TRIG 120 08/08/2020 09:35 AM   HDL 45 (L) 08/08/2020 09:35 AM   LDLCALC 166 (H) 08/08/2020 09:35 AM    Physical Exam:    VS:  BP 128/70   Pulse 72   Ht 5\' 4"  (1.626 m)  Wt 242 lb 3.2 oz (109.9 kg)   SpO2 97%   BMI 41.57 kg/m    No data found.  Wt Readings from Last 3 Encounters:  02/27/22 242 lb 3.2 oz (109.9 kg)  02/20/22 254 lb (115.2 kg)  02/18/22 249 lb 8 oz (113.2 kg)     GEN:  Well nourished, well developed in no acute distress, obese HEENT: Normal NECK: No JVD; No carotid bruits CARDIAC: Irregular regular RESPIRATORY:  Clear to auscultation without rales, wheezing or rhonchi  ABDOMEN: Soft, non-tender, non-distended MUSCULOSKELETAL: No edema; No deformity  SKIN: Warm and dry NEUROLOGIC:  Alert and oriented PSYCHIATRIC:  Normal affect    ASSESSMENT AND PLAN   Persistent atrial fibrillation -Currently rate controlled on carvedilol 12.5 mg twice daily. -Continue Eliquis for stroke prophylaxis -monitor CBC with recent anemia. -Refer to Dr. Steffanie Dunn with previous discussions from A-fib clinic for efficacy of antiarrhythmics + cardioversion -Recommended to follow-up on sleep study per EP appt -Weight loss recommended per EP appt -previously referred to the [facility] prep program  Chronic diastolic heart failure, euvolemic -2D echo 02/20/22 65%, mildly dilated left atria (LA 4.7cm) and no significant valve  disease. -Continue Lasix 20 mg daily. -Check BMET and BNP today.  Dyspnea on exertion -Multifactorial including atrial fibrillation, obesity, deconditioning and anemia  Sleep apnea -Apneic episodes, obese with BMI of 41 -Referred for sleep study -Weight loss encouraged.  Anemia -Denies gross bleeding. -EGD and colonoscopy with finding of bleeding angiodysplastic lesion in the duodenum, treated with APC in 01/2022 -Continues on ferrous sulfate -Monitor CBC with Eliquis use.  Hypertension, well controlled -Continue carvedilol and losartan. -Goal BP is <130/80.  Recommend DASH diet (high in vegetables, fruits, low-fat dairy products, whole grains, poultry, fish, and nuts and low in sweets, sugar-sweetened beverages, and red meats), salt restriction and increase physical activity.  Obesity -Discussed how even a 5-10% weight loss can have cardiovascular benefits.   -Recommend moderate intensity activity for 30 minutes 5 days/week and the DASH diet. -We discussed the importance of weight loss and obesity's contribution to sleep apnea and atrial fibrillation.  Disposition - Follow-up in 3 months with Dr. Steffanie Dunn and in 6 months with Dr. Jacques Navy or myself.   Medication Adjustments/Labs and Tests Ordered: Current medicines are reviewed at length with the patient today.  Concerns regarding medicines are outlined above.  Orders Placed This Encounter  Procedures   Brain natriuretic peptide   Basic metabolic panel   Ambulatory referral to Cardiac Electrophysiology   EKG 12-Lead   Split night study   No orders of the defined types were placed in this encounter.   Patient Instructions  Medication Instructions:  No Changes *If you need a refill on your cardiac medications before your next appointment, please call your pharmacy*   Lab Work: BMET, BNP Today. If you have labs (blood work) drawn today and your tests are completely normal, you will receive your results only  by: MyChart Message (if you have MyChart) OR A paper copy in the mail If you have any lab test that is abnormal or we need to change your treatment, we will call you to review the results.   Testing/Procedures: [facility]. Your physician has recommended that you have a sleep study. This test records several body functions during sleep, including: brain activity, eye movement, oxygen and carbon dioxide blood levels, heart rate and rhythm, breathing rate and rhythm, the flow of air through your mouth and nose, snoring, body muscle movements,  and chest and belly movement.    Follow-Up: At [facility], you and your health needs are our priority.  As part of our continuing mission to provide you with exceptional heart care, we have created designated Provider Care Teams.  These Care Teams include your primary Cardiologist (physician) and Advanced Practice Providers (APPs -  Physician Assistants and Nurse Practitioners) who all work together to provide you with the care you need, when you need it.  We recommend signing up for the patient portal called "MyChart".  Sign up information is provided on this After Visit Summary.  MyChart is used to connect with patients for Virtual Visits (Telemedicine).  Patients are able to view lab/test results, encounter notes, upcoming appointments, etc.  Non-urgent messages can be sent to your provider as well.   To learn more about what you can do with MyChart, go to [email or web address].    Your next appointment:   6 month(s)  The format for your next appointment:   In Person  Provider:   Juanda Crumble, PA-C  or , Parke Poisson, MD.    Other Instructions Recommend Weight Loss and Sleep Study prior EP appointment.  Important Information About Sugar         Signed, Bernette Mayers  02/27/2022 1:35 PM    [facility]

## 2022-02-27 ENCOUNTER — Encounter: Payer: Self-pay | Admitting: Physician Assistant

## 2022-02-27 ENCOUNTER — Other Ambulatory Visit: Payer: Self-pay | Admitting: *Deleted

## 2022-02-27 ENCOUNTER — Ambulatory Visit: Payer: Medicare HMO | Admitting: Physician Assistant

## 2022-02-27 ENCOUNTER — Telehealth: Payer: Self-pay | Admitting: *Deleted

## 2022-02-27 VITALS — BP 128/70 | HR 72 | Ht 64.0 in | Wt 242.2 lb

## 2022-02-27 DIAGNOSIS — I10 Essential (primary) hypertension: Secondary | ICD-10-CM | POA: Diagnosis not present

## 2022-02-27 DIAGNOSIS — I5033 Acute on chronic diastolic (congestive) heart failure: Secondary | ICD-10-CM

## 2022-02-27 DIAGNOSIS — I4811 Longstanding persistent atrial fibrillation: Secondary | ICD-10-CM | POA: Diagnosis not present

## 2022-02-27 DIAGNOSIS — G4730 Sleep apnea, unspecified: Secondary | ICD-10-CM | POA: Diagnosis not present

## 2022-02-27 MED ORDER — LOSARTAN POTASSIUM 100 MG PO TABS: 100.0000 mg | ORAL_TABLET | Freq: Every day | ORAL | 1 refills | Status: DC

## 2022-02-27 NOTE — Patient Instructions (Signed)
Medication Instructions:  No Changes *If you need a refill on your cardiac medications before your next appointment, please call your pharmacy*   Lab Work: BMET, BNP Today. If you have labs (blood work) drawn today and your tests are completely normal, you will receive your results only by: MyChart Message (if you have MyChart) OR A paper copy in the mail If you have any lab test that is abnormal or we need to change your treatment, we will call you to review the results.   Testing/Procedures: [facility]. Your physician has recommended that you have a sleep study. This test records several body functions during sleep, including: brain activity, eye movement, oxygen and carbon dioxide blood levels, heart rate and rhythm, breathing rate and rhythm, the flow of air through your mouth and nose, snoring, body muscle movements, and chest and belly movement.    Follow-Up: At [facility], you and your health needs are our priority.  As part of our continuing mission to provide you with exceptional heart care, we have created designated Provider Care Teams.  These Care Teams include your primary Cardiologist (physician) and Advanced Practice Providers (APPs -  Physician Assistants and Nurse Practitioners) who all work together to provide you with the care you need, when you need it.  We recommend signing up for the patient portal called "MyChart".  Sign up information is provided on this After Visit Summary.  MyChart is used to connect with patients for Virtual Visits (Telemedicine).  Patients are able to view lab/test results, encounter notes, upcoming appointments, etc.  Non-urgent messages can be sent to your provider as well.   To learn more about what you can do with MyChart, go to [email or web address].    Your next appointment:   6 month(s)  The format for your next appointment:   In Person  Provider:   Juanda Crumble, PA-C  or , Parke Poisson, MD.    Other  Instructions Recommend Weight Loss and Sleep Study prior EP appointment.  Important Information About Sugar

## 2022-02-27 NOTE — Telephone Encounter (Signed)
Prior Authorization for split  night sleep study sent to [facility] via web portal. Tracking Number [id number].

## 2022-02-28 ENCOUNTER — Other Ambulatory Visit: Payer: Self-pay | Admitting: *Deleted

## 2022-02-28 LAB — BASIC METABOLIC PANEL
BUN/Creatinine Ratio: 25 (ref 12–28)
BUN: 29 mg/dL — ABNORMAL HIGH (ref 8–27)
CO2: 25 mmol/L (ref 20–29)
Calcium: 9.1 mg/dL (ref 8.7–10.3)
Chloride: 103 mmol/L (ref 96–106)
Creatinine, Ser: 1.16 mg/dL — ABNORMAL HIGH (ref 0.57–1.00)
Glucose: 104 mg/dL — ABNORMAL HIGH (ref 70–99)
Potassium: 4.7 mmol/L (ref 3.5–5.2)
Sodium: 142 mmol/L (ref 134–144)
eGFR: 49 mL/min/{1.73_m2} — ABNORMAL LOW (ref >59)

## 2022-02-28 LAB — BRAIN NATRIURETIC PEPTIDE: BNP: 175.4 pg/mL — ABNORMAL HIGH (ref 0.0–100.0)

## 2022-02-28 MED ORDER — GABAPENTIN 100 MG PO CAPS: 100.0000 mg | ORAL_CAPSULE | Freq: Every day | ORAL | 1 refills | Status: DC

## 2022-02-28 MED ORDER — ATORVASTATIN CALCIUM 40 MG PO TABS: 40.0000 mg | ORAL_TABLET | Freq: Every day | ORAL | 1 refills | Status: DC

## 2022-02-28 NOTE — Telephone Encounter (Signed)
Pt requesting refills for Atorvastatin and Gabapentin through her mail order.

## 2022-03-03 ENCOUNTER — Telehealth: Payer: Self-pay

## 2022-03-03 NOTE — Telephone Encounter (Addendum)
Called patient regarding results. Leftmessage for patient to call office.----- Message from Cannon Kettle, PA-C sent at 03/01/2022  9:07 AM EDT ----- BNP improved from 646 in May --> 359 on June 27 -->175 today.   Consider BNP 175 your baseline when your fluid is at a good level.  Kidney function mildly elevated.   Let's try to decrease lasix to 20mg  1/2 tablet daily.  If leg swelling occurs, can 1 whole tablet on those days.  Recheck BMET in 2-3 weeks.

## 2022-03-04 ENCOUNTER — Ambulatory Visit (INDEPENDENT_AMBULATORY_CARE_PROVIDER_SITE_OTHER): Payer: Medicare HMO | Admitting: Physician Assistant

## 2022-03-04 ENCOUNTER — Encounter: Payer: Self-pay | Admitting: Physician Assistant

## 2022-03-04 VITALS — BP 106/70 | HR 67 | Temp 98.3°F | Ht 64.0 in | Wt 243.5 lb

## 2022-03-04 DIAGNOSIS — I10 Essential (primary) hypertension: Secondary | ICD-10-CM | POA: Diagnosis not present

## 2022-03-04 DIAGNOSIS — M170 Bilateral primary osteoarthritis of knee: Secondary | ICD-10-CM

## 2022-03-04 DIAGNOSIS — K31811 Angiodysplasia of stomach and duodenum with bleeding: Secondary | ICD-10-CM | POA: Diagnosis not present

## 2022-03-04 DIAGNOSIS — N950 Postmenopausal bleeding: Secondary | ICD-10-CM

## 2022-03-04 DIAGNOSIS — R29818 Other symptoms and signs involving the nervous system: Secondary | ICD-10-CM

## 2022-03-04 DIAGNOSIS — R710 Precipitous drop in hematocrit: Secondary | ICD-10-CM

## 2022-03-04 MED ORDER — DICLOFENAC SODIUM 1 % EX GEL: 2.0000 g | Freq: Four times a day (QID) | CUTANEOUS | 1 refills | Status: DC | PRN

## 2022-03-04 NOTE — Progress Notes (Signed)
Tara Collier is a 76 y.o. female here for a follow up on shortness of breath.   History of Present Illness:   Chief Complaint  Patient presents with   Hospitalization Follow-up    Pt here for f/u admitted to hospital on 6/27.    HPI   Hospital follow-up Patient presented to ED with shortness of breath, fatigue and low hemoglobin on 02/18/2022. We have seen her earlier that day for the similar issue. Had blood work in the office which showed hemoglobin of 7.6 and was send to ED. In the ED, pt reported blood on the toilet upon wiping about few days ago. Had work-up in the ED including labs and chest x-rays. Hemoglobin of 7.8 with MCV of 80.3. Her BNP was elevated to 359.5 and troponin was negative. Chest x-ray showed vascular congestion and cardiomegaly. She was given IV lasix and GI was consulted. She was also given a unit of blood. Patient was then admitted in the ED for diuresis. In the ED, she was advised to have colonoscopy and upper endoscopy. Patient had colonoscopy on 02/20/2022 which showed internal hemorrhoids but otherwise normal. Had endoscopy which showed finding of bleeding angiodysplastic lesion in the duodenum and she was treated with APC. She was then discharged home in stable condition.   Today, she notes she has been doing well since being home. She is here with her daughter today. She states her shortness of breath has improved. She was recommended to take iron supplements. Has not started taking this recently. She does admit having vaginal bleeding every 3 months or less. She described this as "pinkish discharge or spotting". She is requesting referral to OB/GYN. She had a vaginal u/s in the ER that showed:  IMPRESSION: 1. Limited exam. 2. Uterine calcifications, with rounded calcifications on prior pelvic radiograph typical of calcified fibroids. The uterus and presumed fibroids are not well delineated on the current exam. 3. The endometrium is not well defined, thickness  cannot be measured. Given endometrial thickness could not be defined, further workup recommendations connect 3 provided based on the current exam. Recommend gyn consultation for consideration of endometrial sampling or further workup. 4. Neither ovary is seen.  No adnexal mass  Suspected Sleep Apnea  Patient has had few apneic episodes at  night. She would like to get sleep study done. Has had increased fatigue as well. Denies any other concerning sx.   Elevated Kidney function  Patient has followed up with cardiologist since being discharged. She had blood work done on 02/27/2022 which showed mildly elevated kidney function. States she has been urinating a lot recently. She was also taking Lasix 20 mg daily for LE swelling but this was decreased to 1/2 tablet daily per cardiology.   HTN Patient notes her blood pressure was dropping in the ED. She was advised to checked this regularly by cardiology. Currently taking Coreg 12.5 twice daily and Losartan 100 mg daily. At home blood pressure readings are: checked regularly. Patient denies chest pain, SOB, blurred vision, dizziness, unusual headaches, lower leg swelling. Patient is  compliant with medication. Denies excessive caffeine intake, stimulant usage, excessive alcohol intake, or increase in salt consumption.  BP Readings from Last 3 Encounters:  03/04/22 106/70  02/27/22 128/70  02/20/22 (!) 123/53    Home BP Log  131/64-July 3rd (am) 133/70-(am) 148/75 -July 4th (pm) 151/85 (pm) 136/59 July 5th (am) 144/64 -July 7th (am) 146/71-July 8th (am) 144/77-July 9th (am) 139/68 -July 10 (am) 139/79 -July 11 (am)  Knee pain Uses topical voltaren gel and is doing well with this. She would like a refill of this medication - it is cheaper as an rx.  Past Medical History:  Diagnosis Date   Arthritis    Atrial fibrillation ([place]) 06/03/2013   Last Assessment & Plan:  Formatting of this note might be different from the original. Continue  propafenone for rhythm control .Patient has clear symptomatic benefit with sinus rhythm maintenance. CHADS-Vasc score over 2, continue anticoagulation with eliquis.   HTN (hypertension) 06/03/2013   Last Assessment & Plan:  Formatting of this note might be different from the original. Hypertension is well controlled.  Continue current medical regimen and low sodium diet.  Reevaluate at next office visit   Hypercholesterolemia    Restless leg syndrome    Urinary incontinence 08/08/2020     Social History   Tobacco Use   Smoking status: Never   Smokeless tobacco: Never  Substance Use Topics   Alcohol use: No   Drug use: No    Past Surgical History:  Procedure Laterality Date   CARDIOVERSION     COLONOSCOPY WITH PROPOFOL N/A 02/20/2022   Procedure: COLONOSCOPY WITH PROPOFOL;  Surgeon: Irene Shipper, MD;  Location: [place];  Service: Gastroenterology;  Laterality: N/A;   ESOPHAGOGASTRODUODENOSCOPY (EGD) WITH PROPOFOL N/A 02/20/2022   Procedure: ESOPHAGOGASTRODUODENOSCOPY (EGD) WITH PROPOFOL;  Surgeon: Irene Shipper, MD;  Location: [place];  Service: Gastroenterology;  Laterality: N/A;   HOT HEMOSTASIS N/A 02/20/2022   Procedure: HOT HEMOSTASIS (ARGON PLASMA COAGULATION/BICAP);  Surgeon: Irene Shipper, MD;  Location: [place];  Service: Gastroenterology;  Laterality: N/A;   RADIOFREQUENCY ABLATION     ROTATOR CUFF REPAIR     TUBAL LIGATION      Family History  Problem Relation Age of Onset   Heart disease Mother    Appendicitis Mother    Appendicitis Father    Heart disease Father    Heart disease Maternal Grandmother     Allergies  Allergen Reactions   Tape Rash    USE PAPER TAPE ONLY!!!   Bee Venom Rash   Norvasc [Amlodipine] Swelling    Current Medications:   Current Outpatient Medications:    acetaminophen (TYLENOL) 325 MG tablet, Take 650 mg by mouth every 6 (six) hours as needed for moderate pain or headache., Disp: , Rfl:    acetaminophen (TYLENOL)  500 MG tablet, Take 500 mg by mouth every 6 (six) hours as needed for mild pain or moderate pain., Disp: , Rfl:    apixaban (ELIQUIS) 5 MG TABS tablet, Take 1 tablet (5 mg total) by mouth 2 (two) times daily. Start taking tomorrow, Disp: 60 tablet, Rfl: 1   atorvastatin (LIPITOR) 40 MG tablet, Take 1 tablet (40 mg total) by mouth daily., Disp: 90 tablet, Rfl: 1   carvedilol (COREG) 12.5 MG tablet, Take 1 tablet (12.5 mg total) by mouth 2 (two) times daily with a meal., Disp: 180 tablet, Rfl: 3   diclofenac Sodium (VOLTAREN) 1 % GEL, Apply 2 g topically 4 (four) times daily as needed (knee pain)., Disp: , Rfl:    ferrous sulfate 325 (65 FE) MG tablet, Take 1 tablet (325 mg total) by mouth daily., Disp: 30 tablet, Rfl: 1   furosemide (LASIX) 20 MG tablet, Take 1 tablet (20 mg total) by mouth daily., Disp: 90 tablet, Rfl: 1   gabapentin (NEURONTIN) 100 MG capsule, Take 1 capsule (100 mg total) by mouth at bedtime., Disp: 90 capsule, Rfl: 1  losartan (COZAAR) 100 MG tablet, Take 1 tablet (100 mg total) by mouth daily., Disp: 90 tablet, Rfl: 1   meclizine (ANTIVERT) 25 MG tablet, Take 1 tablet (25 mg total) by mouth 3 (three) times daily as needed for dizziness., Disp: 30 tablet, Rfl: 3   MIRABEGRON ER PO, Take 1 tablet by mouth daily. Pt given samples at doctors office unsure of dose, Disp: , Rfl:    polyethylene glycol powder (GLYCOLAX/MIRALAX) 17 GM/SCOOP powder, Take 17 g by mouth daily as needed for mild constipation., Disp: , Rfl:    senna (SENOKOT) 8.6 MG TABS tablet, Take 2 tablets by mouth at bedtime as needed for mild constipation., Disp: , Rfl:    Review of Systems:   ROS Negative unless otherwise specified per HPI.   Vitals:   Vitals:   03/04/22 1443  BP: 106/70  Pulse: 67  Temp: 98.3 F (36.8 C)  TempSrc: Temporal  SpO2: 96%  Weight: 243 lb 8 oz (110.5 kg)  Height: 5\' 4"  (1.626 m)     Body mass index is 41.8 kg/m.  Physical Exam:   Physical Exam Vitals and nursing note  reviewed.  Constitutional:      General: She is not in acute distress.    Appearance: She is well-developed. She is not ill-appearing or toxic-appearing.  Cardiovascular:     Rate and Rhythm: Normal rate and regular rhythm.     Pulses: Normal pulses.     Heart sounds: Normal heart sounds, S1 normal and S2 normal.  Pulmonary:     Effort: Pulmonary effort is normal.     Breath sounds: Normal breath sounds.  Skin:    General: Skin is warm and dry.  Neurological:     Mental Status: She is alert.     GCS: GCS eye subscore is 4. GCS verbal subscore is 5. GCS motor subscore is 6.  Psychiatric:        Speech: Speech normal.        Behavior: Behavior normal. Behavior is cooperative.     Assessment and Plan:   Decreased hemoglobin; Angiodysplasia of duodenum with hemorrhage Hospital records reviewed Discussed need for life long iron Discussed q other day iron with vit C Will recheck in 3 months, sooner if concerns She was told she did not need GI follow-up  Post-menopausal bleeding She has never had this evaluated Referral to gyn  Suspected sleep apnea Prior referral closed  We will resubmit today She needs to have sleep study prior to seeing EP  Primary hypertension Overall well controlled Mgmt per cardiology  Primary osteoarthritis of both knees Suspect OA Refill voltaren gel Follow-up prn   I,Savera Zaman,acting as a scribe for , PA.,have documented all relevant documentation on the behalf of [employer], PA,as directed by  Jarold Motto, PA while in the presence of Jarold Motto, Jarold Motto.   I, [place], Jarold Motto, have reviewed all documentation for this visit. The documentation on 03/04/22 for the exam, diagnosis, procedures, and orders are all accurate and complete.  Time spent with patient today was 57 minutes which consisted of chart review, discussing diagnosis, work up, treatment answering questions and documentation.   05/05/22,  PA-C

## 2022-03-04 NOTE — Telephone Encounter (Signed)
Daughter was returning call. Please advise  ?

## 2022-03-04 NOTE — Patient Instructions (Signed)
It was great to see you!  Oral iron I would like for you to start supplementing with oral iron.Per Dr. Marina Goodell -- you will need this LIFELONG. For best absorption, I recommend taking a Vitamin C supplement with your Iron supplement. Please go to your local drug store or grocery store and purchase an over-the-counter Iron 65 mg supplement (ferrous sulfate 325 mg) such as Nature[employer] brand (however any brand is fine) and also purchase an over-the-counter vitamin C supplement of [phone] mg.  I recommend taking the iron and vitamin C supplement of your choice together, on an empty stomach. Take this combination every other day.  We can recheck your levels as soon as 3 months to see how your ferritin is responding.  Referral placed for Gynecology and Sleep Studies  We will recheck your kidney function today   Take care,  Jarold Motto PA-C

## 2022-03-05 LAB — BASIC METABOLIC PANEL
BUN: 27 mg/dL — ABNORMAL HIGH (ref 6–23)
CO2: 28 mEq/L (ref 19–32)
Calcium: 9.4 mg/dL (ref 8.4–10.5)
Chloride: 103 mEq/L (ref 96–112)
Creatinine, Ser: 1.02 mg/dL (ref 0.40–1.20)
GFR: 54.12 mL/min — ABNORMAL LOW (ref >60.00)
Glucose, Bld: 100 mg/dL — ABNORMAL HIGH (ref 70–99)
Potassium: 4.2 mEq/L (ref 3.5–5.1)
Sodium: 140 mEq/L (ref 135–145)

## 2022-03-12 ENCOUNTER — Telehealth: Payer: Self-pay

## 2022-03-12 DIAGNOSIS — I10 Essential (primary) hypertension: Secondary | ICD-10-CM

## 2022-03-12 NOTE — Telephone Encounter (Addendum)
Called patient regarding results. Left detailed message for patient. Advised patient in message will sent order for repeat blood work in 2-3 weeks.----- Message from Cannon Kettle, PA-C sent at 03/01/2022  9:07 AM EDT ----- BNP improved from 646 in May --> 359 on June 27 -->175 today.   Consider BNP 175 your baseline when your fluid is at a good level.  Kidney function mildly elevated.   Let's try to decrease lasix to 20mg  1/2 tablet daily.  If leg swelling occurs, can 1 whole tablet on those days.  Recheck BMET in 2-3 weeks.

## 2022-03-20 DIAGNOSIS — R350 Frequency of micturition: Secondary | ICD-10-CM | POA: Diagnosis not present

## 2022-03-20 DIAGNOSIS — N3946 Mixed incontinence: Secondary | ICD-10-CM | POA: Diagnosis not present

## 2022-03-21 NOTE — Progress Notes (Deleted)
GYNECOLOGY  VISIT   HPI: 76 y.o.   Single White or Caucasian Not Hispanic or Latino  female   No obstetric history on file. with No LMP recorded. Patient is postmenopausal.   here for PMB. Went to ER on 02/18/2022 sent by PCP for low HGB of 7.6. Pt then reported seeing blood in toilet upon wiping and also admitted to PCP about seeing vaginal bleeding q70mo or less. Described it as pinkish d/c or spotting. Pt had limited US done @ ER--unable to calculate endo thickness.     GYNECOLOGIC HISTORY: No LMP recorded. Patient is postmenopausal. Contraception: PM Menopausal hormone therapy: none        OB History   No obstetric history on file.        Patient Active Problem List   Diagnosis Date Noted   Weakness 02/20/2022   Angiodysplasia of duodenum with hemorrhage    Symptomatic anemia 02/18/2022   Uterine bleeding 02/18/2022   Acute on chronic diastolic CHF (congestive heart failure) (HCC) 02/18/2022   Secondary hypercoagulable state (HCC) 10/15/2020   Urinary incontinence 08/08/2020   Arthritis    Hypercholesterolemia    Restless leg syndrome    Atrial fibrillation (HCC) 06/03/2013   HTN (hypertension) 06/03/2013    Past Medical History:  Diagnosis Date   Arthritis    Atrial fibrillation (HCC) 06/03/2013   Last Assessment & Plan:  Formatting of this note might be different from the original. Continue propafenone for rhythm control .Patient has clear symptomatic benefit with sinus rhythm maintenance. CHADS-Vasc score over 2, continue anticoagulation with eliquis.   HTN (hypertension) 06/03/2013   Last Assessment & Plan:  Formatting of this note might be different from the original. Hypertension is well controlled.  Continue current medical regimen and low sodium diet.  Reevaluate at next office visit   Hypercholesterolemia    Restless leg syndrome    Urinary incontinence 08/08/2020    Past Surgical History:  Procedure Laterality Date   CARDIOVERSION     COLONOSCOPY WITH  PROPOFOL N/A 02/20/2022   Procedure: COLONOSCOPY WITH PROPOFOL;  Surgeon: Hilarie Fredrickson, MD;  Location: [facility] ENDOSCOPY;  Service: Gastroenterology;  Laterality: N/A;   ESOPHAGOGASTRODUODENOSCOPY (EGD) WITH PROPOFOL N/A 02/20/2022   Procedure: ESOPHAGOGASTRODUODENOSCOPY (EGD) WITH PROPOFOL;  Surgeon: Hilarie Fredrickson, MD;  Location: [facility] ENDOSCOPY;  Service: Gastroenterology;  Laterality: N/A;   HOT HEMOSTASIS N/A 02/20/2022   Procedure: HOT HEMOSTASIS (ARGON PLASMA COAGULATION/BICAP);  Surgeon: Hilarie Fredrickson, MD;  Location: [facility] ENDOSCOPY;  Service: Gastroenterology;  Laterality: N/A;   RADIOFREQUENCY ABLATION     ROTATOR CUFF REPAIR     TUBAL LIGATION      Current Outpatient Medications  Medication Sig Dispense Refill   acetaminophen (TYLENOL) 325 MG tablet Take 650 mg by mouth every 6 (six) hours as needed for moderate pain or headache.     acetaminophen (TYLENOL) 500 MG tablet Take 500 mg by mouth every 6 (six) hours as needed for mild pain or moderate pain.     apixaban (ELIQUIS) 5 MG TABS tablet Take 1 tablet (5 mg total) by mouth 2 (two) times daily. Start taking tomorrow 60 tablet 1   atorvastatin (LIPITOR) 40 MG tablet Take 1 tablet (40 mg total) by mouth daily. 90 tablet 1   carvedilol (COREG) 12.5 MG tablet Take 1 tablet (12.5 mg total) by mouth 2 (two) times daily with a meal. 180 tablet 3   diclofenac Sodium (VOLTAREN) 1 % GEL Apply 2 g topically 4 (four) times daily as  needed (knee pain). 50 g 1   ferrous sulfate 325 (65 FE) MG tablet Take 1 tablet (325 mg total) by mouth daily. 30 tablet 1   furosemide (LASIX) 20 MG tablet Take 1 tablet (20 mg total) by mouth daily. 90 tablet 1   gabapentin (NEURONTIN) 100 MG capsule Take 1 capsule (100 mg total) by mouth at bedtime. 90 capsule 1   losartan (COZAAR) 100 MG tablet Take 1 tablet (100 mg total) by mouth daily. 90 tablet 1   meclizine (ANTIVERT) 25 MG tablet Take 1 tablet (25 mg total) by mouth 3 (three) times daily as needed for dizziness. 30  tablet 3   MIRABEGRON ER PO Take 1 tablet by mouth daily. Pt given samples at doctors office unsure of dose     polyethylene glycol powder (GLYCOLAX/MIRALAX) 17 GM/SCOOP powder Take 17 g by mouth daily as needed for mild constipation.     senna (SENOKOT) 8.6 MG TABS tablet Take 2 tablets by mouth at bedtime as needed for mild constipation.     No current facility-administered medications for this visit.     ALLERGIES: Tape, Bee venom, and Norvasc [amlodipine]  Family History  Problem Relation Age of Onset   Heart disease Mother    Appendicitis Mother    Appendicitis Father    Heart disease Father    Heart disease Maternal Grandmother     Social History   Socioeconomic History   Marital status: Single    Spouse name: Not on file   Number of children: Not on file   Years of education: Not on file   Highest education level: Not on file  Occupational History   Not on file  Tobacco Use   Smoking status: Never   Smokeless tobacco: Never  Substance and Sexual Activity   Alcohol use: No   Drug use: No   Sexual activity: Not on file  Other Topics Concern   Not on file  Social History Narrative   Daughter in [place]   Lives by herself   Worked for [employer] for 20+ years   Social Determinants of Health   Financial Resource Strain: Not on file  Food Insecurity: Not on file  Transportation Needs: Not on file  Physical Activity: Not on file  Stress: Not on file  Social Connections: Not on file  Intimate Partner Violence: Not on file    ROS  PHYSICAL EXAMINATION:    There were no vitals taken for this visit.    General appearance: alert, cooperative and appears stated age Neck: no adenopathy, supple, symmetrical, trachea midline and thyroid {CHL AMB PHY EX THYROID NORM [phone]::"normal to inspection and palpation"} Breasts: {Exam; breast:13139::"normal appearance, no masses or tenderness"} Abdomen: soft, non-tender; non distended, no masses,  no  organomegaly  Pelvic: External genitalia:  no lesions              Urethra:  normal appearing urethra with no masses, tenderness or lesions              Bartholins and Skenes: normal                 Vagina: normal appearing vagina with normal color and discharge, no lesions              Cervix: {CHL AMB PHY EX CERVIX NORM DEFAULT:[phone]::"no lesions"}              Bimanual Exam:  Uterus:  {CHL AMB PHY EX UTERUS NORM [phone]::"normal size, contour, position,  consistency, mobility, non-tender"}              Adnexa: {CHL AMB PHY EX ADNEXA NO MASS DEFAULT:[phone]::"no mass, fullness, tenderness"}              Rectovaginal: {yes no:[id number]}.  Confirms.              Anus:  normal sphincter tone, no lesions  Chaperone was present for exam.  ASSESSMENT     PLAN    An After Visit Summary was printed and given to the patient.  *** minutes face to face time of which over 50% was spent in counseling.

## 2022-04-03 ENCOUNTER — Telehealth: Payer: Self-pay | Admitting: Internal Medicine

## 2022-04-03 MED ORDER — CARVEDILOL 12.5 MG PO TABS: 12.5000 mg | ORAL_TABLET | Freq: Two times a day (BID) | ORAL | 2 refills | Status: DC

## 2022-04-03 MED ORDER — CARVEDILOL 12.5 MG PO TABS: 12.5000 mg | ORAL_TABLET | Freq: Two times a day (BID) | ORAL | 0 refills | Status: DC

## 2022-04-03 NOTE — Telephone Encounter (Signed)
Patient states she just received a call from our office. Was unable to find documentation as to what it was in regards to. Please advise.

## 2022-04-03 NOTE — Telephone Encounter (Signed)
*  STAT* If patient is at the pharmacy, call can be transferred to refill team.   1. Which medications need to be refilled? (please list name of each medication and dose if known)  carvedilol (COREG) 12.5 MG tablet  2. Which pharmacy/location (including street and city if local pharmacy) is medication to be sent to? [facility] - carvedilol (COREG) 12.5 MG tablet [facility] - [place], [place] - [address]  3. Do they need a 30 day or 90 day supply?   Patient's daughter states the patient is almost completely out of medication. She is requesting to have a small emergency supply [facility] to their local [facility] to last until mail order arrives and a 90 day supply sent to [facility].

## 2022-04-03 NOTE — Telephone Encounter (Signed)
Spoke to patient she requested a 30 day Coreg refill until she receives mail order.Refill sent to pharmacy.

## 2022-04-04 ENCOUNTER — Ambulatory Visit: Payer: Medicare HMO | Admitting: Obstetrics and Gynecology

## 2022-04-07 ENCOUNTER — Other Ambulatory Visit (HOSPITAL_COMMUNITY)
Admission: RE | Admit: 2022-04-07 | Discharge: 2022-04-07 | Disposition: A | Discharge status: Home / Self Care | Payer: Medicare HMO | Source: Ambulatory Visit | Attending: Obstetrics and Gynecology | Admitting: Obstetrics and Gynecology

## 2022-04-07 ENCOUNTER — Encounter: Payer: Self-pay | Admitting: Obstetrics and Gynecology

## 2022-04-07 ENCOUNTER — Other Ambulatory Visit: Payer: Self-pay

## 2022-04-07 ENCOUNTER — Ambulatory Visit (INDEPENDENT_AMBULATORY_CARE_PROVIDER_SITE_OTHER): Payer: Medicare HMO | Admitting: Obstetrics and Gynecology

## 2022-04-07 ENCOUNTER — Telehealth: Payer: Self-pay | Admitting: Obstetrics and Gynecology

## 2022-04-07 VITALS — BP 138/76 | HR 73 | Ht 62.5 in | Wt 244.0 lb

## 2022-04-07 DIAGNOSIS — N950 Postmenopausal bleeding: Secondary | ICD-10-CM

## 2022-04-07 DIAGNOSIS — Z113 Encounter for screening for infections with a predominantly sexual mode of transmission: Secondary | ICD-10-CM | POA: Diagnosis not present

## 2022-04-07 DIAGNOSIS — Z1151 Encounter for screening for human papillomavirus (HPV): Secondary | ICD-10-CM | POA: Insufficient documentation

## 2022-04-07 DIAGNOSIS — Z124 Encounter for screening for malignant neoplasm of cervix: Secondary | ICD-10-CM | POA: Insufficient documentation

## 2022-04-07 NOTE — Telephone Encounter (Signed)
Order placed in Epic.  Per DPR access note I spoke with patient's daughter and provided scheduler's phone number so she can call and schedule MRI. I explained to her that there are screening questions regarding the patient for her to answer.  I will monitor so that I can forward to KimAlexis to check PA.

## 2022-04-07 NOTE — Patient Instructions (Addendum)
Endometrial Biopsy  An endometrial biopsy is a procedure to remove tissue samples from the endometrium, which is the lining of the uterus. The tissue that is removed can then be checked under a microscope for disease. This procedure is used to diagnose conditions such as endometrial cancer, endometrial tuberculosis, polyps, or other inflammatory conditions. This procedure may also be used to investigate uterine bleeding to determine where you are in your menstrual cycle or how your hormone levels are affecting the lining of the uterus. Tell a health care provider about: Any allergies you have. All medicines you are taking, including vitamins, herbs, eye drops, creams, and over-the-counter medicines. Any problems you or family members have had with anesthetic medicines. Any bleeding problems you have. Any surgeries you have had. Any medical conditions you have. Whether you are pregnant or may be pregnant. What are the risks? Your health care provider will talk with you about risks. These may include: Bleeding. Pelvic infection. Puncture of the wall of the uterus with the biopsy device (rare). Allergic reactions to medicines. What happens before the procedure? Keep a record of your menstrual cycles as told by your health care provider. You may need to schedule your procedure for a specific time in your cycle. Bring a sanitary pad in case you need to wear one after the procedure. Ask your health care provider about: Changing or stopping your regular medicines. These include any diabetes medicines or blood thinners you take. Taking medicines such as aspirin and ibuprofen. These medicines can thin your blood. Do not take these medicines unless your health care provider tells you to. Taking over-the-counter medicines, vitamins, herbs, and supplements. Plan to have someone take you home from the hospital or clinic. What happens during the procedure? You will lie on an exam table with your feet  and legs supported as in a pelvic exam. Your health care provider will insert an instrument into your vagina to see your cervix. Your cervix will be cleansed with an antiseptic solution. A medicine (local anesthetic) will be used to numb the cervix. A forceps instrument will be used to hold your cervix steady for the biopsy. A thin, rod-like instrument (uterine sound) will be inserted through your cervix to determine the length of your uterus and the location where the biopsy sample will be removed. A thin, flexible tube (catheter) will be inserted through your cervix and into the uterus. The catheter will be used to collect the biopsy sample from your endometrial tissue. The tube and instruments will be removed, and the tissue sample will be sent to a lab for examination. The procedure may vary among health care providers and hospitals. What happens after the procedure? Your blood pressure, heart rate, breathing rate, and blood oxygen level will be monitored until you leave the hospital or clinic. It is up to you to get the results of your procedure. Ask your health care provider, or the department that is doing the procedure, when your results will be ready. Summary An endometrial biopsy is a procedure to remove tissue samples from the endometrium, which is the lining of the uterus. This procedure is used to diagnose conditions such as endometrial cancer, endometrial tuberculosis, polyps, or other inflammatory conditions. It is up to you to get the results of your procedure. Ask your health care provider, or the department that is doing the procedure, when your results will be ready. This information is not intended to replace advice given to you by your health care provider. Make sure you   discuss any questions you have with your health care provider. Document Revised: 11/26/2021 Document Reviewed: 11/26/2021 Elsevier Patient Education  2023 Elsevier Inc.   Postmenopausal  Bleeding Postmenopausal bleeding is any bleeding that a woman has after she has entered menopause. Menopause is the end of a woman's fertile years. After menopause, a woman no longer ovulates and does not have menstrual periods. Therefore, she should no longer have bleeding from her vagina. Postmenopausal bleeding may have various causes, including: Menopausal hormone therapy (MHT). Endometrial atrophy. After menopause, low estrogen hormone levels cause the membrane that lines the uterus (endometrium) to become thin. You may have bleeding as the endometrium thins. Endometrial hyperplasia. This condition is caused by excess estrogen hormones and low levels of progesterone hormones. The excess estrogen causes the endometrium to thicken, which can lead to bleeding. In some cases, this can lead to cancer of the uterus. Endometrial cancer. Noncancerous growths (polyps) on the endometrium, the lining of the uterus, or the cervix. Uterine fibroids. These are noncancerous growths in or around the uterus muscle tissue that can cause heavy bleeding. Any type of postmenopausal bleeding, even if it appears to be a typical menstrual period, should be checked by your health care provider. Treatment will depend on the cause of the bleeding. Follow these instructions at home:  Pay attention to any changes in your symptoms. Let your health care provider know about them. Avoid using tampons and douches as told by your health care provider. Change your pads regularly. Get regular pelvic exams, including Pap tests, as told by your health care provider. Take iron supplements as told by your health care provider. Take over-the-counter and prescription medicines only as told by your health care provider. Keep all follow-up visits. This is important. Contact a health care provider if: You have new bleeding from the vagina after menopause. You have pain in your abdomen. Get help right away if: You have a fever or  chills. You have severe pain with bleeding. You are passing blood clots. You have heavy bleeding, need more than 1 pad an hour, and have never experienced this before. You have headaches or feel faint or dizzy. Summary Postmenopausal bleeding is any bleeding that a woman has after she has entered into menopause. Postmenopausal bleeding may have various causes. Treatment will depend on the cause of the bleeding. Any type of postmenopausal bleeding, even if it appears to be a typical menstrual period, should be checked by your health care provider. Be sure to pay attention to any changes in your symptoms and keep all follow-up visits. This information is not intended to replace advice given to you by your health care provider. Make sure you discuss any questions you have with your health care provider. Document Revised: 01/26/2020 Document Reviewed: 01/26/2020 Elsevier Patient Education  2023 Elsevier Inc.  

## 2022-04-07 NOTE — Telephone Encounter (Signed)
Please schedule pelvic MRI for postmenopausal bleeding and pelvic ultrasound which was limited and not able to define anatomy.

## 2022-04-07 NOTE — Progress Notes (Unsigned)
GYNECOLOGY  VISIT   HPI: 76 y.o.   Single  Caucasian  female   [id number] with No LMP recorded. Patient is postmenopausal.   here for postmenopausal bleeding off and on x 6 months to 1 year.   Daughter present for the visit today except for the physical exam.  Felt like her period was going to start.  Some cramping and does endorse constipation.   Menopause about age 4 yo. No HRT now or in the past.  Also had bleeding after 2000.   Hx anemia.  Hx transfusion.  Last Hgb was 9.2 on 02/20/22.  She had colonoscopy   Pelvic US done 02/18/22:  Narrative & Impression  CLINICAL DATA:  Postmenopausal uterine bleeding.   EXAM: TRANSABDOMINAL AND TRANSVAGINAL ULTRASOUND OF PELVIS   TECHNIQUE: Both transabdominal and transvaginal ultrasound examinations of the pelvis were performed. Transabdominal technique was performed for global imaging of the pelvis including uterus, ovaries, adnexal regions, and pelvic cul-de-sac. It was necessary to proceed with endovaginal exam following the transabdominal exam to visualize the uterus endometrium and adnexa.   COMPARISON:  Pelvis radiograph 01/30/2021   FINDINGS: Uterus   Measurements: 6.7 x 3.5 x 3.1 cm = volume: 38 mL. The uterus is poorly defined. Multiple echogenic shadowing structures in the uterus typical of calcifications. This likely represents of poorly defined fibroid when compared with prior pelvic radiograph, however is not well delineated on the current exam.   Endometrium   Not well delineated.   Right ovary   Not seen.  No adnexal mass.   Left ovary   Not seen.  No adnexal mass   Other findings   No abnormal free fluid.   IMPRESSION: 1. Limited exam. 2. Uterine calcifications, with rounded calcifications on prior pelvic radiograph typical of calcified fibroids. The uterus and presumed fibroids are not well delineated on the current exam. 3. The endometrium is not well defined, thickness cannot be measured.  Given endometrial thickness could not be defined, further workup recommendations connect 3 provided based on the current exam. Recommend gyn consultation for consideration of endometrial sampling or further workup. 4. Neither ovary is seen.  No adnexal mass    GYNECOLOGIC HISTORY: No LMP recorded. Patient is postmenopausal. Contraception:  PMP Menopausal hormone therapy:  none Last mammogram:   Years ago per patient--normal Last pap smear:   Year ago--always normal        OB History     Gravida  3   Para  2   Term      Preterm      AB  1   Living  2      SAB  1   IAB      Ectopic      Multiple      Live Births                 Patient Active Problem List   Diagnosis Date Noted   Weakness 02/20/2022   Angiodysplasia of duodenum with hemorrhage    Symptomatic anemia 02/18/2022   Uterine bleeding 02/18/2022   Acute on chronic diastolic CHF (congestive heart failure) (HCC) 02/18/2022   Secondary hypercoagulable state (HCC) 10/15/2020   Urinary incontinence 08/08/2020   Arthritis    Hypercholesterolemia    Restless leg syndrome    Atrial fibrillation (HCC) 06/03/2013   HTN (hypertension) 06/03/2013    Past Medical History:  Diagnosis Date   Abnormal uterine bleeding    Arthritis    Atrial fibrillation (HCC) 06/03/2013  Last Assessment & Plan:  Formatting of this note might be different from the original. Continue propafenone for rhythm control .Patient has clear symptomatic benefit with sinus rhythm maintenance. CHADS-Vasc score over 2, continue anticoagulation with eliquis.   HTN (hypertension) 06/03/2013   Last Assessment & Plan:  Formatting of this note might be different from the original. Hypertension is well controlled.  Continue current medical regimen and low sodium diet.  Reevaluate at next office visit   Hypercholesterolemia    Restless leg syndrome    Urinary incontinence 08/08/2020    Past Surgical History:  Procedure Laterality Date    CARDIOVERSION     COLONOSCOPY WITH PROPOFOL N/A 02/20/2022   Procedure: COLONOSCOPY WITH PROPOFOL;  Surgeon: Hilarie Fredrickson, MD;  Location: [facility] ENDOSCOPY;  Service: Gastroenterology;  Laterality: N/A;   ESOPHAGOGASTRODUODENOSCOPY (EGD) WITH PROPOFOL N/A 02/20/2022   Procedure: ESOPHAGOGASTRODUODENOSCOPY (EGD) WITH PROPOFOL;  Surgeon: Hilarie Fredrickson, MD;  Location: [facility] ENDOSCOPY;  Service: Gastroenterology;  Laterality: N/A;   HOT HEMOSTASIS N/A 02/20/2022   Procedure: HOT HEMOSTASIS (ARGON PLASMA COAGULATION/BICAP);  Surgeon: Hilarie Fredrickson, MD;  Location: [facility] ENDOSCOPY;  Service: Gastroenterology;  Laterality: N/A;   RADIOFREQUENCY ABLATION     ROTATOR CUFF REPAIR     TUBAL LIGATION      Current Outpatient Medications  Medication Sig Dispense Refill   acetaminophen (TYLENOL) 325 MG tablet Take 650 mg by mouth every 6 (six) hours as needed for moderate pain or headache.     acetaminophen (TYLENOL) 500 MG tablet Take 500 mg by mouth every 6 (six) hours as needed for mild pain or moderate pain.     apixaban (ELIQUIS) 5 MG TABS tablet Take 1 tablet (5 mg total) by mouth 2 (two) times daily. Start taking tomorrow 60 tablet 1   atorvastatin (LIPITOR) 40 MG tablet Take 1 tablet (40 mg total) by mouth daily. 90 tablet 1   carvedilol (COREG) 12.5 MG tablet Take 1 tablet (12.5 mg total) by mouth 2 (two) times daily with a meal. 60 tablet 0   diclofenac Sodium (VOLTAREN) 1 % GEL Apply 2 g topically 4 (four) times daily as needed (knee pain). 50 g 1   ferrous sulfate 325 (65 FE) MG tablet Take 1 tablet (325 mg total) by mouth daily. 30 tablet 1   furosemide (LASIX) 20 MG tablet Take 1 tablet (20 mg total) by mouth daily. 90 tablet 1   gabapentin (NEURONTIN) 100 MG capsule Take 1 capsule (100 mg total) by mouth at bedtime. 90 capsule 1   losartan (COZAAR) 100 MG tablet Take 1 tablet (100 mg total) by mouth daily. 90 tablet 1   meclizine (ANTIVERT) 25 MG tablet Take 1 tablet (25 mg total) by mouth 3 (three)  times daily as needed for dizziness. 30 tablet 3   MYRBETRIQ 50 MG TB24 tablet Take 50 mg by mouth daily.     polyethylene glycol powder (GLYCOLAX/MIRALAX) 17 GM/SCOOP powder Take 17 g by mouth daily as needed for mild constipation.     senna (SENOKOT) 8.6 MG TABS tablet Take 2 tablets by mouth at bedtime as needed for mild constipation.     No current facility-administered medications for this visit.     ALLERGIES: Tape, Bee venom, and Norvasc [amlodipine]  Family History  Problem Relation Age of Onset   Heart disease Mother    Appendicitis Mother    Appendicitis Father    Heart disease Father    Heart disease Maternal Grandmother     Social  History   Socioeconomic History   Marital status: Single    Spouse name: Not on file   Number of children: Not on file   Years of education: Not on file   Highest education level: Not on file  Occupational History   Not on file  Tobacco Use   Smoking status: Never   Smokeless tobacco: Never  Vaping Use   Vaping Use: Never used  Substance and Sexual Activity   Alcohol use: No   Drug use: No   Sexual activity: Not Currently    Birth control/protection: Surgical    Comment: tubal  Other Topics Concern   Not on file  Social History Narrative   Daughter in [place]   Lives by herself   Worked for [employer] for 20+ years   Social Determinants of Health   Financial Resource Strain: Not on file  Food Insecurity: Not on file  Transportation Needs: Not on file  Physical Activity: Not on file  Stress: Not on file  Social Connections: Not on file  Intimate Partner Violence: Not on file    Review of Systems  Genitourinary:        Postmenopausal bleeding.  All other systems reviewed and are negative.   PHYSICAL EXAMINATION:    BP 138/76   Pulse 73   Ht 5' 2.5" (1.588 m)   Wt 244 lb (110.7 kg)   SpO2 97%   BMI 43.92 kg/m     General appearance: alert, cooperative and appears stated age Head: Normocephalic, without obvious  abnormality, atraumatic Neck: no adenopathy, supple, symmetrical, trachea midline and thyroid normal to inspection and palpation Lungs: clear to auscultation bilaterally Heart: regular rate and rhythm Abdomen: soft, non-tender, no masses,  no organomegaly Extremities: extremities normal, atraumatic, no cyanosis or edema Skin: Skin color, texture, turgor normal. No rashes or lesions No abnormal inguinal nodes palpated Neurologic: Grossly normal  Pelvic: External genitalia:  no lesions              Urethra:  normal appearing urethra with no masses, tenderness or lesions              Bartholins and Skenes: normal                 Vagina: normal appearing vagina with normal color and discharge, no lesions              Cervix: no lesions.  Cervix flush with vaginal cuff.  Os is open with pap collection.                 Bimanual Exam:  Uterus:  normal size, contour, position, consistency, mobility, non-tender              Adnexa: no mass, fullness, tenderness              Rectal exam: yes.  Confirms.              Anus:  normal sphincter tone, no lesions  Chaperone was present for exam:  Noreene Larsson, RN  ASSESSMENT  Postmenopausal bleeding.  Recurrent. Uterine calcifications, possible fibroids.  Limited pelvic US to define anatomy.  Cervical cancer screening. Atrial fibrillation. On Eliquis.   PLAN  Etiologies of postmenopausal bleeding reviewed:  atrophy, infection, polyps, precancer, and cancer.  Pap and HR HPV.  Return for endometrial biopsy.  Procedure explained.  Risks and benefits reviewed.  Take 2 tylenol 1 hour prior to EMB.  Does not need to stop Eliquis.   Will  schedule pelvic MRI.  Patient will update her mammogram. List of facilities to patient and her daughter.    An After Visit Summary was printed and given to the patient.  36 min  total time was spent for this patient encounter, including preparation, face-to-face counseling with the patient, coordination of care, and  documentation of the encounter.

## 2022-04-08 NOTE — Progress Notes (Unsigned)
GYNECOLOGY  VISIT   HPI: 76 y.o.   Single  Caucasian  female   [id number] with No LMP recorded. Patient is postmenopausal.   here for EMB.    Had some bleeding this weekend, brown blood.   GYNECOLOGIC HISTORY: No LMP recorded. Patient is postmenopausal. Contraception:  PMP Menopausal hormone therapy:  none Last mammogram:  Years ago per patient--normal Last pap smear:  04-07-22, neg HR HPV        OB History     Gravida  3   Para  2   Term      Preterm      AB  1   Living  2      SAB  1   IAB      Ectopic      Multiple      Live Births                 Patient Active Problem List   Diagnosis Date Noted   Weakness 02/20/2022   Angiodysplasia of duodenum with hemorrhage    Symptomatic anemia 02/18/2022   Uterine bleeding 02/18/2022   Acute on chronic diastolic CHF (congestive heart failure) (HCC) 02/18/2022   Secondary hypercoagulable state (HCC) 10/15/2020   Urinary incontinence 08/08/2020   Arthritis    Hypercholesterolemia    Restless leg syndrome    Atrial fibrillation (HCC) 06/03/2013   HTN (hypertension) 06/03/2013    Past Medical History:  Diagnosis Date   Abnormal uterine bleeding    Arthritis    Atrial fibrillation (HCC) 06/03/2013   Last Assessment & Plan:  Formatting of this note might be different from the original. Continue propafenone for rhythm control .Patient has clear symptomatic benefit with sinus rhythm maintenance. CHADS-Vasc score over 2, continue anticoagulation with eliquis.   HTN (hypertension) 06/03/2013   Last Assessment & Plan:  Formatting of this note might be different from the original. Hypertension is well controlled.  Continue current medical regimen and low sodium diet.  Reevaluate at next office visit   Hypercholesterolemia    Restless leg syndrome    Urinary incontinence 08/08/2020    Past Surgical History:  Procedure Laterality Date   CARDIOVERSION     COLONOSCOPY WITH PROPOFOL N/A 02/20/2022   Procedure:  COLONOSCOPY WITH PROPOFOL;  Surgeon: Hilarie Fredrickson, MD;  Location: [facility] ENDOSCOPY;  Service: Gastroenterology;  Laterality: N/A;   ESOPHAGOGASTRODUODENOSCOPY (EGD) WITH PROPOFOL N/A 02/20/2022   Procedure: ESOPHAGOGASTRODUODENOSCOPY (EGD) WITH PROPOFOL;  Surgeon: Hilarie Fredrickson, MD;  Location: [facility] ENDOSCOPY;  Service: Gastroenterology;  Laterality: N/A;   HOT HEMOSTASIS N/A 02/20/2022   Procedure: HOT HEMOSTASIS (ARGON PLASMA COAGULATION/BICAP);  Surgeon: Hilarie Fredrickson, MD;  Location: [facility] ENDOSCOPY;  Service: Gastroenterology;  Laterality: N/A;   RADIOFREQUENCY ABLATION     ROTATOR CUFF REPAIR     TUBAL LIGATION      Current Outpatient Medications  Medication Sig Dispense Refill   acetaminophen (TYLENOL) 325 MG tablet Take 650 mg by mouth every 6 (six) hours as needed for moderate pain or headache.     acetaminophen (TYLENOL) 500 MG tablet Take 500 mg by mouth every 6 (six) hours as needed for mild pain or moderate pain.     apixaban (ELIQUIS) 5 MG TABS tablet Take 1 tablet (5 mg total) by mouth 2 (two) times daily. Start taking tomorrow 60 tablet 1   atorvastatin (LIPITOR) 40 MG tablet Take 1 tablet (40 mg total) by mouth daily. 90 tablet 1   carvedilol (COREG) 12.5 MG  tablet Take 1 tablet (12.5 mg total) by mouth 2 (two) times daily with a meal. 60 tablet 0   diclofenac Sodium (VOLTAREN) 1 % GEL Apply 2 g topically 4 (four) times daily as needed (knee pain). 50 g 1   ferrous sulfate 325 (65 FE) MG tablet Take 1 tablet (325 mg total) by mouth daily. 30 tablet 1   furosemide (LASIX) 20 MG tablet Take 1 tablet (20 mg total) by mouth daily. 90 tablet 1   gabapentin (NEURONTIN) 100 MG capsule Take 1 capsule (100 mg total) by mouth at bedtime. 90 capsule 1   losartan (COZAAR) 100 MG tablet Take 1 tablet (100 mg total) by mouth daily. 90 tablet 1   meclizine (ANTIVERT) 25 MG tablet Take 1 tablet (25 mg total) by mouth 3 (three) times daily as needed for dizziness. 30 tablet 3   MYRBETRIQ 50 MG TB24 tablet  Take 50 mg by mouth daily.     polyethylene glycol powder (GLYCOLAX/MIRALAX) 17 GM/SCOOP powder Take 17 g by mouth daily as needed for mild constipation.     senna (SENOKOT) 8.6 MG TABS tablet Take 2 tablets by mouth at bedtime as needed for mild constipation.     No current facility-administered medications for this visit.     ALLERGIES: Tape, Bee venom, and Norvasc [amlodipine]  Family History  Problem Relation Age of Onset   Heart disease Mother    Appendicitis Mother    Appendicitis Father    Heart disease Father    Heart disease Maternal Grandmother     Social History   Socioeconomic History   Marital status: Single    Spouse name: Not on file   Number of children: Not on file   Years of education: Not on file   Highest education level: Not on file  Occupational History   Not on file  Tobacco Use   Smoking status: Never   Smokeless tobacco: Never  Vaping Use   Vaping Use: Never used  Substance and Sexual Activity   Alcohol use: No   Drug use: No   Sexual activity: Not Currently    Birth control/protection: Surgical    Comment: tubal  Other Topics Concern   Not on file  Social History Narrative   Daughter in [place]   Lives by herself   Worked for [employer] for 20+ years   Social Determinants of Health   Financial Resource Strain: Not on file  Food Insecurity: Not on file  Transportation Needs: Not on file  Physical Activity: Not on file  Stress: Not on file  Social Connections: Not on file  Intimate Partner Violence: Not on file    Review of Systems  All other systems reviewed and are negative.   PHYSICAL EXAMINATION:    BP 130/80   Pulse 76   Ht 5' 2.5" (1.588 m)   Wt 244 lb (110.7 kg)   SpO2 96%   BMI 43.92 kg/m     General appearance: alert, cooperative and appears stated age  EMB  Sterile prep with Hibiclens. Cervix flush with vaginal apex.  Paracervical block with 10 cc 1% lidocaine, lot [id number], exp 08/26/23.  Os finder used.  Pipelle  passed to 7 cm x 2.  Tissue to pathology.  Minimal EBL.  No complications.   Chaperone was present for exam:  Marchelle Folks, CMA  ASSESSMENT  Postmenopausal bleeding.  Pelvic US with limited evaluation of reproductive anatomy.   PLAN  Fu EMB.  Pelvic MRI.  I recommend  a mammogram.  Information on sites to patient.    An After Visit Summary was printed and given to the patient.  ______ minutes face to face time of which over 50% was spent in counseling.

## 2022-04-09 LAB — CYTOLOGY - PAP
Comment: NEGATIVE
Diagnosis: NEGATIVE
High risk HPV: NEGATIVE

## 2022-04-14 ENCOUNTER — Other Ambulatory Visit: Payer: Self-pay

## 2022-04-14 DIAGNOSIS — N950 Postmenopausal bleeding: Secondary | ICD-10-CM

## 2022-04-15 ENCOUNTER — Other Ambulatory Visit (HOSPITAL_COMMUNITY)
Admission: RE | Admit: 2022-04-15 | Discharge: 2022-04-15 | Disposition: A | Discharge status: Home / Self Care | Payer: Medicare HMO | Source: Ambulatory Visit | Attending: Obstetrics and Gynecology | Admitting: Obstetrics and Gynecology

## 2022-04-15 ENCOUNTER — Encounter: Payer: Self-pay | Admitting: Obstetrics and Gynecology

## 2022-04-15 ENCOUNTER — Ambulatory Visit: Payer: Medicare HMO | Admitting: Obstetrics and Gynecology

## 2022-04-15 DIAGNOSIS — N950 Postmenopausal bleeding: Secondary | ICD-10-CM | POA: Insufficient documentation

## 2022-04-15 NOTE — Patient Instructions (Signed)
Endometrial Biopsy  An endometrial biopsy is a procedure to remove tissue samples from the endometrium, which is the lining of the uterus. The tissue that is removed can then be checked under a microscope for disease. This procedure is used to diagnose conditions such as endometrial cancer, endometrial tuberculosis, polyps, or other inflammatory conditions. This procedure may also be used to investigate uterine bleeding to determine where you are in your menstrual cycle or how your hormone levels are affecting the lining of the uterus. Tell a health care provider about: Any allergies you have. All medicines you are taking, including vitamins, herbs, eye drops, creams, and over-the-counter medicines. Any problems you or family members have had with anesthetic medicines. Any bleeding problems you have. Any surgeries you have had. Any medical conditions you have. Whether you are pregnant or may be pregnant. What are the risks? Your health care provider will talk with you about risks. These may include: Bleeding. Pelvic infection. Puncture of the wall of the uterus with the biopsy device (rare). Allergic reactions to medicines. What happens before the procedure? Keep a record of your menstrual cycles as told by your health care provider. You may need to schedule your procedure for a specific time in your cycle. Bring a sanitary pad in case you need to wear one after the procedure. Ask your health care provider about: Changing or stopping your regular medicines. These include any diabetes medicines or blood thinners you take. Taking medicines such as aspirin and ibuprofen. These medicines can thin your blood. Do not take these medicines unless your health care provider tells you to. Taking over-the-counter medicines, vitamins, herbs, and supplements. Plan to have someone take you home from the hospital or clinic. What happens during the procedure? You will lie on an exam table with your feet  and legs supported as in a pelvic exam. Your health care provider will insert an instrument into your vagina to see your cervix. Your cervix will be cleansed with an antiseptic solution. A medicine (local anesthetic) will be used to numb the cervix. A forceps instrument will be used to hold your cervix steady for the biopsy. A thin, rod-like instrument (uterine sound) will be inserted through your cervix to determine the length of your uterus and the location where the biopsy sample will be removed. A thin, flexible tube (catheter) will be inserted through your cervix and into the uterus. The catheter will be used to collect the biopsy sample from your endometrial tissue. The tube and instruments will be removed, and the tissue sample will be sent to a lab for examination. The procedure may vary among health care providers and hospitals. What happens after the procedure? Your blood pressure, heart rate, breathing rate, and blood oxygen level will be monitored until you leave the hospital or clinic. It is up to you to get the results of your procedure. Ask your health care provider, or the department that is doing the procedure, when your results will be ready. Summary An endometrial biopsy is a procedure to remove tissue samples from the endometrium, which is the lining of the uterus. This procedure is used to diagnose conditions such as endometrial cancer, endometrial tuberculosis, polyps, or other inflammatory conditions. It is up to you to get the results of your procedure. Ask your health care provider, or the department that is doing the procedure, when your results will be ready. This information is not intended to replace advice given to you by your health care provider. Make sure you   discuss any questions you have with your health care provider. Document Revised: 11/26/2021 Document Reviewed: 11/26/2021 Elsevier Patient Education  2023 [employer]  

## 2022-04-17 LAB — SURGICAL PATHOLOGY

## 2022-04-18 NOTE — Telephone Encounter (Signed)
[facility] left message on 04/10/22, 04/15/22 to call.

## 2022-04-22 ENCOUNTER — Telehealth: Payer: Self-pay | Admitting: *Deleted

## 2022-04-22 ENCOUNTER — Other Ambulatory Visit: Payer: Self-pay

## 2022-04-22 DIAGNOSIS — N950 Postmenopausal bleeding: Secondary | ICD-10-CM

## 2022-04-22 NOTE — Telephone Encounter (Signed)
Prior Authorization for split night sleep study sent to [facility] via web portal. Approval  Number [id number]. Valid dates 03/17/22 to 06/15/22.

## 2022-04-25 ENCOUNTER — Other Ambulatory Visit: Payer: Self-pay | Admitting: Internal Medicine

## 2022-04-29 DIAGNOSIS — R350 Frequency of micturition: Secondary | ICD-10-CM | POA: Diagnosis not present

## 2022-04-29 DIAGNOSIS — N3946 Mixed incontinence: Secondary | ICD-10-CM | POA: Diagnosis not present

## 2022-04-30 ENCOUNTER — Ambulatory Visit (INDEPENDENT_AMBULATORY_CARE_PROVIDER_SITE_OTHER): Payer: Medicare HMO | Admitting: Neurology

## 2022-04-30 ENCOUNTER — Encounter: Payer: Self-pay | Admitting: Neurology

## 2022-04-30 VITALS — BP 158/77 | HR 68 | Ht 63.0 in | Wt 249.0 lb

## 2022-04-30 DIAGNOSIS — R351 Nocturia: Secondary | ICD-10-CM | POA: Diagnosis not present

## 2022-04-30 DIAGNOSIS — F513 Sleepwalking [somnambulism]: Secondary | ICD-10-CM

## 2022-04-30 DIAGNOSIS — R0683 Snoring: Secondary | ICD-10-CM | POA: Diagnosis not present

## 2022-04-30 DIAGNOSIS — R0602 Shortness of breath: Secondary | ICD-10-CM

## 2022-04-30 DIAGNOSIS — G4730 Sleep apnea, unspecified: Secondary | ICD-10-CM | POA: Diagnosis not present

## 2022-04-30 DIAGNOSIS — I480 Paroxysmal atrial fibrillation: Secondary | ICD-10-CM | POA: Diagnosis not present

## 2022-04-30 DIAGNOSIS — D649 Anemia, unspecified: Secondary | ICD-10-CM

## 2022-04-30 DIAGNOSIS — E66813 Obesity, class 3: Secondary | ICD-10-CM

## 2022-04-30 NOTE — Telephone Encounter (Signed)
Left message for patient daughter Kenney Houseman to call regarding MRI.

## 2022-04-30 NOTE — Progress Notes (Signed)
SLEEP MEDICINE CLINIC    Provider:  Melvyn Novas, MD  Primary Care Physician:  Jarold Motto, [place] [address] [place] [address]     Referring Provider: Jarold Motto, [place] [address] [place],  [place] [address]          Chief Complaint according to patient   Patient presents with:     New Patient (Initial Visit)     Very poor historian, tangential, talkative- not having  finished paper work in 20 minutes...      HISTORY OF PRESENT ILLNESS:  04-30-2022:  Tara Collier is a 76 y.o. year old White or Caucasian female patient seen here as a referral on 04/30/2022 from PCP for an evaluation of possible apnea. .  Chief concern according to patient :  Pt with daughter, rm 86. Pt wakes up frequently during the night. She can get short of breath easily. She wakes up trying to catch her breath and snores in sleep.   She has A Fib and cardio wanting her to be evaluated for osa. Frequently Nods off during the day. Doesn't really have a sleep schedule or routine. She wakes up frequently during the night to void as well.  She dreams a lot, used to sleep walking.  Tara Collier is a 76 y.o. female with a hx of atrial fibrillation, hypertension hyperlipidemia.  She was initially diagnosed with atrial fibrillation and atrial flutter remotely and underwent flutter ablation in 2013. She has previously been on propafenone but she was lost to cardiology follow up and discontinued this on her own  She has follow-up with A-fib clinic.  Antiarrhythmics plus cardioversion vs. rate control was discussed.  Patient wanted to work on lifestyle modification.  She was referred to the [facility] exercise program and to sleep medicine for sleep apnea evaluation.  She last saw the A-fib clinic on Dec 25, 2021 and noted more shortness of breath with exertion and orthopnea.  She was started on Lasix 20 mg daily recommended to follow-up with cardiology. Admitted to the hospital on February 18, 2022 with shortness  of breath & fatigue.  Her hemoglobin was found to be 7.  She was given a unit of blood and started on IV Lasix.  EGD and colonoscopy with finding of bleeding angiodysplastic lesion in the duodenum, treated with APC. She was diuresed inpatient. 14 pounds.  She   has a past medical history of Abnormal uterine bleeding, Arthritis, Atrial fibrillation (HCC) (06/03/2013), HTN (hypertension) (06/03/2013), Hypercholesterolemia, Restless leg syndrome, and Urinary incontinence (08/08/2020).   Sleep relevant medical history: Nocturia/ , Sleep walking, vivid dreams, daytime nods.  she has left the house sleep walking . Frequently misplacing items.  Family medical /sleep history: NO other family member on CPAP with OSA, brother, son and grandson are sleep walkers.    Social history:  Patient is retired from Engineering geologist ,  and lives in a household alone.  Family status is divorced, with adult children, and grandchildren.  The patient currently  used to work in Engineering geologist.  Tobacco use: none .  ETOH use: none,  Caffeine intake in form of Coffee( decaff) Soda( 2-3 / week) Tea ( decaff) or energy drinks. Regular exercise: none .Marland Kitchen        Sleep habits are as follows: The patient's dinner time is between ( no routine )  PM. The patient goes to bed at ( no routine)  PM and binge watches TV- continues to sleep for ( no routine )  hours. Her daughter finds her sleeping on the couch any time of the day. She estimates 5 hours of nocturnal sleep, often in bed not earlier then 4 AM.  The preferred sleep position is variable , with the support of 2 pillows. Cant sleep flat. Dreams are reportedly frequent/vivid.  ( No routine) AM is the usual rise time. The patient wakes up spontaneously- at 5 AM but goes back to bed-any time.   She reports not feeling refreshed or restored in AM, with symptoms such as dry mouth, morning headaches, and residual fatigue.  Naps are taken frequently, in PM , when not physically active  - lasting from  30 to 90 minutes .   Review of Systems: Out of a complete 14 system review, the patient complains of only the following symptoms, and all other reviewed systems are negative.:  Fatigue, sleepiness , snoring, fragmented sleep, Insomnia .  No routines, no set times.   Cleans her house in the nights, sleeps in the daytime.  Feels most energized at 8 PM.  Lives alone-    How likely are you to doze in the following situations: 0 = not likely, 1 = slight chance, 2 = moderate chance, 3 = high chance   Sitting and Reading? Watching Television? Sitting inactive in a public place (theater or meeting)? As a passenger in a car for an hour without a break? Lying down in the afternoon when circumstances permit? Sitting and talking to someone? Sitting quietly after lunch without alcohol? In a car, while stopped for a few minutes in traffic?   Total = completed with help. 12/ 24 points   FSS endorsed at not completed / 63 points.   GDS no completed  Social History   Socioeconomic History   Marital status: Single    Spouse name: Not on file   Number of children: Not on file   Years of education: Not on file   Highest education level: Not on file  Occupational History   Not on file  Tobacco Use   Smoking status: Never   Smokeless tobacco: Never  Vaping Use   Vaping Use: Never used  Substance and Sexual Activity   Alcohol use: No   Drug use: No   Sexual activity: Not Currently    Birth control/protection: Surgical    Comment: tubal  Other Topics Concern   Not on file  Social History Narrative   Daughter in [place]   Lives by herself   Worked for [employer] for 20+ years   Social Determinants of Health   Financial Resource Strain: Not on file  Food Insecurity: Not on file  Transportation Needs: Not on file  Physical Activity: Not on file  Stress: Not on file  Social Connections: Not on file    Family History  Problem Relation Age of Onset   Heart disease Mother     Appendicitis Mother    Appendicitis Father    Heart disease Father    Heart disease Maternal Grandmother     Past Medical History:  Diagnosis Date   Abnormal uterine bleeding    Arthritis    Atrial fibrillation (HCC) 06/03/2013   Last Assessment & Plan:  Formatting of this note might be different from the original. Continue propafenone for rhythm control .Patient has clear symptomatic benefit with sinus rhythm maintenance. CHADS-Vasc score over 2, continue anticoagulation with eliquis.   HTN (hypertension) 06/03/2013   Last Assessment & Plan:  Formatting of this note might be different from  the original. Hypertension is well controlled.  Continue current medical regimen and low sodium diet.  Reevaluate at next office visit   Hypercholesterolemia    Restless leg syndrome    Urinary incontinence 08/08/2020    Past Surgical History:  Procedure Laterality Date   CARDIOVERSION     COLONOSCOPY WITH PROPOFOL N/A 02/20/2022   Procedure: COLONOSCOPY WITH PROPOFOL;  Surgeon: Hilarie FredricksonPerry, John N, MD;  [facility];  Service: Gastroenterology;  Laterality: N/A;   ESOPHAGOGASTRODUODENOSCOPY (EGD) WITH PROPOFOL N/A 02/20/2022   Procedure: ESOPHAGOGASTRODUODENOSCOPY (EGD) WITH PROPOFOL;  Surgeon: Hilarie FredricksonPerry, John N, MD;  [facility];  Service: Gastroenterology;  Laterality: N/A;   HOT HEMOSTASIS N/A 02/20/2022   Procedure: HOT HEMOSTASIS (ARGON PLASMA COAGULATION/BICAP);  Surgeon: Hilarie FredricksonPerry, John N, MD;  [facility];  Service: Gastroenterology;  Laterality: N/A;   RADIOFREQUENCY ABLATION     ROTATOR CUFF REPAIR     TUBAL LIGATION       Current Outpatient Medications on File Prior to Visit  Medication Sig Dispense Refill   acetaminophen (TYLENOL) 325 MG tablet Take 650 mg by mouth every 6 (six) hours as needed for moderate pain or headache.     acetaminophen (TYLENOL) 500 MG tablet Take 500 mg by mouth every 6 (six) hours as needed for mild pain or moderate pain.     apixaban  (ELIQUIS) 5 MG TABS tablet Take 1 tablet (5 mg total) by mouth 2 (two) times daily. Start taking tomorrow 60 tablet 1   Ascorbic Acid (VITAMIN C) 1000 MG tablet Take 1,000 mg by mouth daily.     atorvastatin (LIPITOR) 40 MG tablet Take 1 tablet (40 mg total) by mouth daily. 90 tablet 1   carvedilol (COREG) 12.5 MG tablet Take 1 tablet (12.5 mg total) by mouth 2 (two) times daily with a meal. 60 tablet 0   diclofenac Sodium (VOLTAREN) 1 % GEL Apply 2 g topically 4 (four) times daily as needed (knee pain). 50 g 1   ferrous sulfate 325 (65 FE) MG tablet Take 1 tablet (325 mg total) by mouth daily. 30 tablet 1   furosemide (LASIX) 20 MG tablet Take 1 tablet (20 mg total) by mouth daily. (Patient taking differently: Take 10 mg by mouth daily.) 90 tablet 1   gabapentin (NEURONTIN) 100 MG capsule Take 1 capsule (100 mg total) by mouth at bedtime. 90 capsule 1   losartan (COZAAR) 100 MG tablet Take 1 tablet (100 mg total) by mouth daily. 90 tablet 1   meclizine (ANTIVERT) 25 MG tablet Take 1 tablet (25 mg total) by mouth 3 (three) times daily as needed for dizziness. 30 tablet 3   MYRBETRIQ 50 MG TB24 tablet Take 50 mg by mouth daily.     polyethylene glycol powder (GLYCOLAX/MIRALAX) 17 GM/SCOOP powder Take 17 g by mouth daily as needed for mild constipation.     senna (SENOKOT) 8.6 MG TABS tablet Take 2 tablets by mouth at bedtime as needed for mild constipation.     No current facility-administered medications on file prior to visit.    Allergies  Allergen Reactions   Tape Rash    USE PAPER TAPE ONLY!!!   Bee Venom Rash   Norvasc [Amlodipine] Swelling    Physical exam:  Today's Vitals   04/30/22 1515  BP: (!) 158/77  Pulse: 68  Weight: 249 lb (112.9 kg)  Height: 5\' 3"  (1.6 m)   Body mass index is 44.11 kg/m.   Wt Readings from Last 3 Encounters:  04/30/22  249 lb (112.9 kg)  04/15/22 244 lb (110.7 kg)  04/07/22 244 lb (110.7 kg)     Ht Readings from Last 3 Encounters:  04/30/22  5\' 3"  (1.6 m)  04/15/22 5' 2.5" (1.588 m)  04/07/22 5' 2.5" (1.588 m)      General: The patient is awake, alert and appears not in acute distress. She is unable to concentrate on the interview,tangential, attention is waning. She is pale. The patient is well groomed. Head: Normocephalic, atraumatic.  Neck is supple.  Mallampati 3,  neck circumference:18 inches . Nasal airflow  patent.  Retrognathia is not seen.  Dental status: lost teeth  Cardiovascular:  Regular rate and cardiac rhythm by pulse,  without distended neck veins. Respiratory:Skin:  With evidence of ankle edema, or rash. Trunk: The patient's posture is erect.   Neurologic exam : The patient is awake and alert, oriented to place and time.   Memory subjective described as intact.  Attention span & concentration ability appears severely impaired.   Speech is fluent,  without  dysarthria, dysphonia or aphasia.  Mood and affect are appropriate.   Cranial nerves: no loss of smell or taste reported  Pupils are equal and briskly reactive to light. Funduscopic exam deferred. .  Extraocular movements in vertical and horizontal planes were intact and without nystagmus. No Diplopia. Visual fields by finger perimetry are intact. Hearing was intact to soft voice and finger rubbing.    Facial sensation intact to fine touch.  Facial motor strength is symmetric and tongue and uvula move midline.  Neck ROM : rotation, tilt and flexion extension were normal for age and shoulder shrug was symmetrical.    Motor exam:  Symmetric bulk, tone and ROM.  proximal muscles are strong, hip flexor and adductor  Normal tone without cog-wheeling, symmetrically decreased grip strength - weak grip. .   Sensory:  Fine touch and vibration were normal.  Proprioception tested in the upper extremities was normal.   Coordination:deferred.  Toe and heel walk were deferred.  Deep tendon reflexes: in the upper and lower extremities are symmetric and intact.   Babinski response was deferred.        After spending a total time of 60  minutes face to face and additional time for physical and neurologic examination, review of laboratory studies,  personal review of imaging studies, reports and results of other testing and review of referral information / records as far as provided in visit, I have established the following assessments: The history was difficult to obtain , the patient not able to focus or answer questions in a structured organized manner.  1) no sleep routines, free flowing circadian rhythm.   2) unable to get any witnessed sleep accounts   3) severe anemia, ankle edema, recent vaginal bleeding ( !).  Extensive medical history- cardiac, oncology, gyn, GI, not easily summarized.    My Plan is to proceed with:  1)  HST as her  circadian rhythm is not be replicated in a sleep lab. If she can get into a routine with bedtime, no daytime naps, I would feel different.   2) She snores, she is sleepy, dry mouth, morning headaches and gasping, choking, and high degree of fatigue  3) I suspect beginning dementia.   I would like to thank 04/09/22, PA and [place], [place], [place] [phone] [place],  [place] for allowing me to meet with and to take care of this pleasant patient.   I plan to  follow up either personally or through our NP within 2-3 months.   CC: I will share my notes with PCP - .  Electronically signed by: Melvyn Novas, MD 04/30/2022 3:27 PM  [facility] Board certified by The [employer] of Sleep Medicine and Diplomate of the [employer] of Sleep Medicine. Board certified In Neurology through the ABPN, Fellow of the [employer] of Neurology. Medical Director of [facility].

## 2022-04-30 NOTE — Patient Instructions (Signed)
Quality Sleep Information, Adult Quality sleep is important for your mental and physical health. It also improves your quality of life. Quality sleep means you: Are asleep for most of the time you are in bed. Fall asleep within 30 minutes. Wake up no more than once a night. Are awake for no longer than 20 minutes if you do wake up during the night. Most adults need 7-8 hours of quality sleep each night. How can poor sleep affect me? If you do not get enough quality sleep, you may have: Mood swings. Daytime sleepiness. Decreased alertness, reaction time, and concentration. Sleep disorders, such as insomnia and sleep apnea. Difficulty with: Solving problems. Coping with stress. Paying attention. These issues may affect your performance and productivity at work, school, and home. Lack of sleep may also put you at higher risk for accidents, suicide, and risky behaviors. If you do not get quality sleep, you may also be at higher risk for several health problems, including: Infections. Type 2 diabetes. Heart disease. High blood pressure. Obesity. Worsening of long-term conditions, like arthritis, kidney disease, depression, Parkinson's disease, and epilepsy. What actions can I take to get more quality sleep? Sleep schedule and routine Stick to a sleep schedule. Go to sleep and wake up at about the same time each day. Do not try to sleep less on weekdays and make up for lost sleep on weekends. This does not work. Limit naps during the day to 30 minutes or less. Do not take naps in the late afternoon. Make time to relax before bed. Reading, listening to music, or taking a hot bath promotes quality sleep. Make your bedroom a place that promotes quality sleep. Keep your bedroom dark, quiet, and at a comfortable room temperature. Make sure your bed is comfortable. Avoid using electronic devices that give off bright blue light for 30 minutes before bedtime. Your brain perceives bright blue light  as sunlight. This includes television, phones, and computers. If you are lying awake in bed for longer than 20 minutes, get up and do a relaxing activity until you feel sleepy. Lifestyle     Try to get at least 30 minutes of exercise on most days. Do not exercise 2-3 hours before going to bed. Do not use any products that contain nicotine or tobacco. These products include cigarettes, chewing tobacco, and vaping devices, such as e-cigarettes. If you need help quitting, ask your health care provider. Do not drink caffeinated beverages for at least 8 hours before going to bed. Coffee, tea, and some sodas contain caffeine. Do not drink alcohol or eat large meals close to bedtime. Try to get at least 30 minutes of sunlight every day. Morning sunlight is best. Medical concerns Work with your health care provider to treat medical conditions that may affect sleeping, such as: Nasal obstruction. Snoring. Sleep apnea and other sleep disorders. Talk to your health care provider if you think any of your prescription medicines may cause you to have difficulty falling or staying asleep. If you have sleep problems, talk with a sleep consultant. If you think you have a sleep disorder, talk with your health care provider about getting evaluated by a specialist. Where to find more information Sleep Foundation: sleepfoundation.org American Academy of Sleep Medicine: aasm.org Centers for Disease Control and Prevention (CDC): cdc.gov Contact a health care provider if: You have trouble getting to sleep or staying asleep. You often wake up very early in the morning and cannot get back to sleep. You have daytime sleepiness. You   have daytime sleep attacks of suddenly falling asleep and sudden muscle weakness (narcolepsy). You have a tingling sensation in your legs with a strong urge to move your legs (restless legs syndrome). You stop breathing briefly during sleep (sleep apnea). You think you have a sleep  disorder or are taking a medicine that is affecting your quality of sleep. Summary Most adults need 7-8 hours of quality sleep each night. Getting enough quality sleep is important for your mental and physical health. Make your bedroom a place that promotes quality sleep, and avoid things that may cause you to have poor sleep, such as alcohol, caffeine, smoking, or large meals. Talk to your health care provider if you have trouble falling asleep or staying asleep. This information is not intended to replace advice given to you by your health care provider. Make sure you discuss any questions you have with your health care provider. Document Revised: 12/04/2021 Document Reviewed: 12/04/2021 Elsevier Patient Education  2023 Elsevier Inc.  

## 2022-05-05 ENCOUNTER — Telehealth: Payer: Self-pay

## 2022-05-05 NOTE — Telephone Encounter (Signed)
See result note info for details.  Appointment for pelvic MRI scheduled Saturday, Sept 23 at 2pm at [facility].  Will route high priority to KimAlexis to check on PA.

## 2022-05-05 NOTE — Telephone Encounter (Signed)
I recommend proceeding forward with her pelvic MRI as her pelvic US at [facility] was nondiagnostic.  Order has already been placed, but I believe it has not been scheduled yet.

## 2022-05-06 ENCOUNTER — Other Ambulatory Visit: Payer: Self-pay | Admitting: Physician Assistant

## 2022-05-06 DIAGNOSIS — I4821 Permanent atrial fibrillation: Secondary | ICD-10-CM

## 2022-05-06 DIAGNOSIS — I5033 Acute on chronic diastolic (congestive) heart failure: Secondary | ICD-10-CM

## 2022-05-06 DIAGNOSIS — I10 Essential (primary) hypertension: Secondary | ICD-10-CM

## 2022-05-06 DIAGNOSIS — G4730 Sleep apnea, unspecified: Secondary | ICD-10-CM

## 2022-05-06 NOTE — Telephone Encounter (Signed)
MRI has been scheduled 05/17/22 at 2pm. Routed to [facility] to PA.

## 2022-05-06 NOTE — Telephone Encounter (Signed)
Please schedule office visit with me a few days after her pelvic MRI.   I will review results with her and plan for a repeat endometrial biopsy that day.

## 2022-05-07 NOTE — Telephone Encounter (Signed)
Message sent to appointment to schedule.

## 2022-05-09 ENCOUNTER — Other Ambulatory Visit: Payer: Self-pay | Admitting: Obstetrics and Gynecology

## 2022-05-09 DIAGNOSIS — N950 Postmenopausal bleeding: Secondary | ICD-10-CM

## 2022-05-09 NOTE — Progress Notes (Signed)
Order placed for repeat endometrial biopsy.

## 2022-05-15 NOTE — Telephone Encounter (Signed)
Dr.Silva per appointments:   "[name], RMA Left 2 messages for patient (daughter) to call and schedule; patient has not called back.

## 2022-05-16 NOTE — Telephone Encounter (Signed)
PA approved.

## 2022-05-17 ENCOUNTER — Ambulatory Visit
Admission: RE | Admit: 2022-05-17 | Discharge: 2022-05-17 | Disposition: A | Discharge status: Home / Self Care | Payer: Medicare HMO | Source: Ambulatory Visit | Attending: Obstetrics and Gynecology | Admitting: Obstetrics and Gynecology

## 2022-05-17 DIAGNOSIS — N950 Postmenopausal bleeding: Secondary | ICD-10-CM | POA: Diagnosis not present

## 2022-05-17 DIAGNOSIS — N888 Other specified noninflammatory disorders of cervix uteri: Secondary | ICD-10-CM | POA: Diagnosis not present

## 2022-05-17 MED ORDER — GADOBENATE DIMEGLUMINE 529 MG/ML IV SOLN
20.0000 mL | Freq: Once | INTRAVENOUS | Status: AC | PRN
  Administered 2022-05-17: 20 mL via INTRAVENOUS

## 2022-05-21 ENCOUNTER — Ambulatory Visit: Payer: Medicare HMO | Admitting: Physician Assistant

## 2022-05-23 ENCOUNTER — Other Ambulatory Visit: Payer: Self-pay | Admitting: *Deleted

## 2022-05-23 ENCOUNTER — Other Ambulatory Visit: Payer: Self-pay | Admitting: Obstetrics and Gynecology

## 2022-05-23 DIAGNOSIS — Z09 Encounter for follow-up examination after completed treatment for conditions other than malignant neoplasm: Secondary | ICD-10-CM

## 2022-05-23 NOTE — Telephone Encounter (Signed)
Please see MRI result note.

## 2022-05-23 NOTE — Telephone Encounter (Signed)
Mri result reviewed encounter closed.

## 2022-05-27 ENCOUNTER — Encounter: Payer: Self-pay | Admitting: Physician Assistant

## 2022-05-27 ENCOUNTER — Ambulatory Visit (INDEPENDENT_AMBULATORY_CARE_PROVIDER_SITE_OTHER): Payer: Medicare HMO | Admitting: Physician Assistant

## 2022-05-27 VITALS — BP 144/80 | HR 73 | Temp 97.7°F | Ht 63.0 in | Wt 244.5 lb

## 2022-05-27 DIAGNOSIS — I10 Essential (primary) hypertension: Secondary | ICD-10-CM | POA: Diagnosis not present

## 2022-05-27 DIAGNOSIS — R710 Precipitous drop in hematocrit: Secondary | ICD-10-CM | POA: Diagnosis not present

## 2022-05-27 DIAGNOSIS — Z23 Encounter for immunization: Secondary | ICD-10-CM | POA: Diagnosis not present

## 2022-05-27 NOTE — Patient Instructions (Signed)
It was great to see you!  To do: --Keep an eye on your blood pressure for me --Call to schedule your sleep study --Call Dr Marlou Sa about your knees --Make follow-up appointment with urology --Make follow-up appointment with Dr Quincy Simmonds for repeat endometrial biopsy and MRI  Let's follow-up in 6 months, sooner if you have concerns.  Take care,  Inda Coke PA-C

## 2022-05-27 NOTE — Progress Notes (Signed)
Tara Collier is a 76 y.o. female here for a follow up of a pre-existing problem.  History of Present Illness:   Chief Complaint  Patient presents with   Hypertension    HPI   HTN Currently taking carvedilol 12.5 mg BID, losartan 100 mg daily. At home blood pressure readings are: checked but she is unsure of the readings. Patient denies chest pain, SOB, blurred vision, dizziness, unusual headaches, lower leg swelling. Patient is compliant with medication. Denies excessive caffeine intake, stimulant usage, excessive alcohol intake, or increase in salt consumption.  BP Readings from Last 3 Encounters:  05/27/22 (!) 144/80  04/30/22 (!) 158/77  04/15/22 130/80   She is suspected to have sleep apnea but has yet to have her sleep study.  Decreased hgb Overall feeling well, denies severe fatigue/lightheadedness or dizziness She did follow-up with Dr. Quincy Simmonds for endometrial biopsy due to her postmenopausal bleeding but the results were inconclusive and this needs to be repeated She also had a pelvic MRI which had some abnormalities including a fibroid in her uterus and nonspecific nodules on her cervix.  She is waiting to have a repeat MRI in January 2024.     Past Medical History:  Diagnosis Date   Abnormal uterine bleeding    Arthritis    Atrial fibrillation ([place]) 06/03/2013   Last Assessment & Plan:  Formatting of this note might be different from the original. Continue propafenone for rhythm control .Patient has clear symptomatic benefit with sinus rhythm maintenance. CHADS-Vasc score over 2, continue anticoagulation with eliquis.   HTN (hypertension) 06/03/2013   Last Assessment & Plan:  Formatting of this note might be different from the original. Hypertension is well controlled.  Continue current medical regimen and low sodium diet.  Reevaluate at next office visit   Hypercholesterolemia    Restless leg syndrome    Urinary incontinence 08/08/2020     Social History   Tobacco  Use   Smoking status: Never   Smokeless tobacco: Never  Vaping Use   Vaping Use: Never used  Substance Use Topics   Alcohol use: No   Drug use: No    Past Surgical History:  Procedure Laterality Date   CARDIOVERSION     COLONOSCOPY WITH PROPOFOL N/A 02/20/2022   Procedure: COLONOSCOPY WITH PROPOFOL;  Surgeon: Irene Shipper, MD;  Location: [place];  Service: Gastroenterology;  Laterality: N/A;   ESOPHAGOGASTRODUODENOSCOPY (EGD) WITH PROPOFOL N/A 02/20/2022   Procedure: ESOPHAGOGASTRODUODENOSCOPY (EGD) WITH PROPOFOL;  Surgeon: Irene Shipper, MD;  Location: [place];  Service: Gastroenterology;  Laterality: N/A;   HOT HEMOSTASIS N/A 02/20/2022   Procedure: HOT HEMOSTASIS (ARGON PLASMA COAGULATION/BICAP);  Surgeon: Irene Shipper, MD;  Location: [place];  Service: Gastroenterology;  Laterality: N/A;   RADIOFREQUENCY ABLATION     ROTATOR CUFF REPAIR     TUBAL LIGATION      Family History  Problem Relation Age of Onset   Heart disease Mother    Appendicitis Mother    Appendicitis Father    Heart disease Father    Heart disease Maternal Grandmother     Allergies  Allergen Reactions   Tape Rash    USE PAPER TAPE ONLY!!!   Bee Venom Rash   Norvasc [Amlodipine] Swelling    Current Medications:   Current Outpatient Medications:    acetaminophen (TYLENOL) 325 MG tablet, Take 650 mg by mouth every 6 (six) hours as needed for moderate pain or headache., Disp: , Rfl:    acetaminophen (TYLENOL) 500  MG tablet, Take 500 mg by mouth every 6 (six) hours as needed for mild pain or moderate pain., Disp: , Rfl:    apixaban (ELIQUIS) 5 MG TABS tablet, Take 1 tablet (5 mg total) by mouth 2 (two) times daily. Start taking tomorrow, Disp: 60 tablet, Rfl: 1   Ascorbic Acid (VITAMIN C) 1000 MG tablet, Take 1,000 mg by mouth daily., Disp: , Rfl:    atorvastatin (LIPITOR) 40 MG tablet, Take 1 tablet (40 mg total) by mouth daily., Disp: 90 tablet, Rfl: 1   carvedilol (COREG) 12.5 MG  tablet, Take 1 tablet (12.5 mg total) by mouth 2 (two) times daily with a meal., Disp: 60 tablet, Rfl: 0   diclofenac Sodium (VOLTAREN) 1 % GEL, Apply 2 g topically 4 (four) times daily as needed (knee pain)., Disp: 50 g, Rfl: 1   ferrous sulfate 325 (65 FE) MG tablet, Take 1 tablet (325 mg total) by mouth daily., Disp: 30 tablet, Rfl: 1   furosemide (LASIX) 20 MG tablet, Take 1 tablet (20 mg total) by mouth daily. (Patient taking differently: Take 10 mg by mouth daily.), Disp: 90 tablet, Rfl: 1   gabapentin (NEURONTIN) 100 MG capsule, Take 1 capsule (100 mg total) by mouth at bedtime., Disp: 90 capsule, Rfl: 1   losartan (COZAAR) 100 MG tablet, Take 1 tablet (100 mg total) by mouth daily., Disp: 90 tablet, Rfl: 1   meclizine (ANTIVERT) 25 MG tablet, Take 1 tablet (25 mg total) by mouth 3 (three) times daily as needed for dizziness., Disp: 30 tablet, Rfl: 3   MYRBETRIQ 50 MG TB24 tablet, Take 50 mg by mouth daily., Disp: , Rfl:    polyethylene glycol powder (GLYCOLAX/MIRALAX) 17 GM/SCOOP powder, Take 17 g by mouth daily as needed for mild constipation., Disp: , Rfl:    senna (SENOKOT) 8.6 MG TABS tablet, Take 2 tablets by mouth at bedtime as needed for mild constipation., Disp: , Rfl:    Review of Systems:   ROS Negative unless otherwise specified per HPI.  Vitals:   Vitals:   05/27/22 1511 05/27/22 1558  BP: (!) 140/90 (!) 144/80  Pulse: 73   Temp: 97.7 F (36.5 C)   TempSrc: Temporal   SpO2: 95%   Weight: 244 lb 8 oz (110.9 kg)   Height: 5\' 3"  (1.6 m)      Body mass index is 43.31 kg/m.  Physical Exam:   Physical Exam Constitutional:      General: She is not in acute distress.    Appearance: Normal appearance. She is not ill-appearing.  HENT:     Head: Normocephalic and atraumatic.     Right Ear: External ear normal.     Left Ear: External ear normal.  Eyes:     Extraocular Movements: Extraocular movements intact.     Pupils: Pupils are equal, round, and reactive to  light.  Cardiovascular:     Rate and Rhythm: Normal rate. Rhythm irregular.     Heart sounds: Normal heart sounds. No murmur heard.    No gallop.  Pulmonary:     Effort: Pulmonary effort is normal. No respiratory distress.     Breath sounds: Normal breath sounds. No wheezing or rales.  Skin:    General: Skin is warm and dry.  Neurological:     Mental Status: She is alert and oriented to person, place, and time.  Psychiatric:        Judgment: Judgment normal.     Assessment and Plan:   Primary  hypertension Her goal blood pressure is less than 130/80 per cardiology I recommend that she continue to monitor her blood pressure and follow-up with them if blood pressures remain above this No evidence of endorgan damage on my exam Patient is asymptomatic  Decreased hemoglobin She is feeling well overall She understands the importance of following up with Dr. Edward Jolly for further endometrial biopsy sampling and repeat MR We will recheck hemoglobin today  Need for prophylactic vaccination against Streptococcus pneumoniae (pneumococcus) UTD  Need for immunization against influenza UTD    I,Param Shah,acting as a scribe for [employer], PA.,have documented all relevant documentation on the behalf of Jarold Motto, PA,as directed by  Jarold Motto, PA while in the presence of Jarold Motto, [place].  I, Jarold Motto, [place], have reviewed all documentation for this visit. The documentation on 05/27/22 for the exam, diagnosis, procedures, and orders are all accurate and complete.  Jarold Motto, PA-C

## 2022-05-28 LAB — CBC WITH DIFFERENTIAL/PLATELET
Basophils Absolute: 0 10*3/uL (ref 0.0–0.1)
Basophils Relative: 0.7 % (ref 0.0–3.0)
Eosinophils Absolute: 0.1 10*3/uL (ref 0.0–0.7)
Eosinophils Relative: 2.9 % (ref 0.0–5.0)
HCT: 36.7 % (ref 36.0–46.0)
Hemoglobin: 12.2 g/dL (ref 12.0–15.0)
Lymphocytes Relative: 20 % (ref 12.0–46.0)
Lymphs Abs: 1 10*3/uL (ref 0.7–4.0)
MCHC: 33.2 g/dL (ref 30.0–36.0)
MCV: 88.6 fl (ref 78.0–100.0)
Monocytes Absolute: 0.5 10*3/uL (ref 0.1–1.0)
Monocytes Relative: 10.7 % (ref 3.0–12.0)
Neutro Abs: 3.3 10*3/uL (ref 1.4–7.7)
Neutrophils Relative %: 65.7 % (ref 43.0–77.0)
Platelets: 196 10*3/uL (ref 150.0–400.0)
RBC: 4.14 Mil/uL (ref 3.87–5.11)
RDW: 16.1 % — ABNORMAL HIGH (ref 11.5–15.5)
WBC: 5 10*3/uL (ref 4.0–10.5)

## 2022-05-28 LAB — COMPREHENSIVE METABOLIC PANEL
ALT: 24 U/L (ref 0–35)
AST: 27 U/L (ref 0–37)
Albumin: 4.1 g/dL (ref 3.5–5.2)
Alkaline Phosphatase: 97 U/L (ref 39–117)
BUN: 21 mg/dL (ref 6–23)
CO2: 26 mEq/L (ref 19–32)
Calcium: 9.2 mg/dL (ref 8.4–10.5)
Chloride: 107 mEq/L (ref 96–112)
Creatinine, Ser: 0.98 mg/dL (ref 0.40–1.20)
GFR: 56.69 mL/min — ABNORMAL LOW (ref >60.00)
Glucose, Bld: 87 mg/dL (ref 70–99)
Potassium: 4.6 mEq/L (ref 3.5–5.1)
Sodium: 142 mEq/L (ref 135–145)
Total Bilirubin: 1.2 mg/dL (ref 0.2–1.2)
Total Protein: 6.6 g/dL (ref 6.0–8.3)

## 2022-05-29 ENCOUNTER — Telehealth: Payer: Self-pay | Admitting: Physician Assistant

## 2022-05-29 NOTE — Telephone Encounter (Signed)
Pts daughter called.. she will keep the pts appt with Dr Lars Mage and I made her a 6 month follow up with Dr Margaretann Loveless in Feb 2024.

## 2022-05-29 NOTE — Telephone Encounter (Signed)
  Pt's daughter calling and want to ask Caron Presume if pt needs to see Dr. Quentin Ore before or after sleep study. Pt is scheduled on 06/04/22 with Dr. Lars Mage and pt is scheduled for sleep study on 06/11/22.

## 2022-06-04 ENCOUNTER — Ambulatory Visit: Payer: Medicare HMO | Attending: Cardiology | Admitting: Cardiology

## 2022-06-04 ENCOUNTER — Encounter: Payer: Self-pay | Admitting: Cardiology

## 2022-06-04 VITALS — BP 138/82 | HR 64 | Ht 63.0 in | Wt 241.0 lb

## 2022-06-04 DIAGNOSIS — I10 Essential (primary) hypertension: Secondary | ICD-10-CM | POA: Diagnosis not present

## 2022-06-04 DIAGNOSIS — I4819 Other persistent atrial fibrillation: Secondary | ICD-10-CM | POA: Diagnosis not present

## 2022-06-04 DIAGNOSIS — D649 Anemia, unspecified: Secondary | ICD-10-CM

## 2022-06-04 NOTE — Progress Notes (Deleted)
Electrophysiology Office Note:    Date:  06/04/2022   ID:  Tara Collier, DOB 08-27-1945, MRN [id number]  PCP:  Inda Coke, [facility] Cardiologist:  Elouise Munroe, MD  [facility] Electrophysiologist:  None   Referring MD: Warren Lacy, PA*   Chief Complaint: AF  History of Present Illness:    Tara Collier is a 76 y.o. female who presents for an evaluation of AF at the request of Caron Presume, PA-C and Dr Margaretann Loveless. Their medical history includes HTN, HLD, AF. She underwent an AFL ablation in 2013 and then was managed with propafenone. This medication was self discontinued. In June 2022 she was referred to the AF clinic by Dr Margaretann Loveless after reporting worsening shortness of breath and palpitations. She was admitted in June with symptomatic anemia. EGD/colon revealed an angiodysplasia in the duodenum treated with APC.      Past Medical History:  Diagnosis Date   Abnormal uterine bleeding    Arthritis    Atrial fibrillation ([place]) 06/03/2013   Last Assessment & Plan:  Formatting of this note might be different from the original. Continue propafenone for rhythm control .Patient has clear symptomatic benefit with sinus rhythm maintenance. CHADS-Vasc score over 2, continue anticoagulation with eliquis.   HTN (hypertension) 06/03/2013   Last Assessment & Plan:  Formatting of this note might be different from the original. Hypertension is well controlled.  Continue current medical regimen and low sodium diet.  Reevaluate at next office visit   Hypercholesterolemia    Restless leg syndrome    Urinary incontinence 08/08/2020    Past Surgical History:  Procedure Laterality Date   CARDIOVERSION     COLONOSCOPY WITH PROPOFOL N/A 02/20/2022   Procedure: COLONOSCOPY WITH PROPOFOL;  Surgeon: Irene Shipper, MD;  Location: [place];  Service: Gastroenterology;  Laterality: N/A;   ESOPHAGOGASTRODUODENOSCOPY (EGD) WITH PROPOFOL N/A 02/20/2022   Procedure:  ESOPHAGOGASTRODUODENOSCOPY (EGD) WITH PROPOFOL;  Surgeon: Irene Shipper, MD;  Location: [place];  Service: Gastroenterology;  Laterality: N/A;   HOT HEMOSTASIS N/A 02/20/2022   Procedure: HOT HEMOSTASIS (ARGON PLASMA COAGULATION/BICAP);  Surgeon: Irene Shipper, MD;  Location: [place];  Service: Gastroenterology;  Laterality: N/A;   RADIOFREQUENCY ABLATION     ROTATOR CUFF REPAIR     TUBAL LIGATION      Current Medications: No outpatient medications have been marked as taking for the 06/04/22 encounter (Appointment) with Vickie Epley, MD.     Allergies:   Tape, Bee venom, and Norvasc [amlodipine]   Social History   Socioeconomic History   Marital status: Single    Spouse name: Not on file   Number of children: Not on file   Years of education: Not on file   Highest education level: Not on file  Occupational History   Not on file  Tobacco Use   Smoking status: Never   Smokeless tobacco: Never  Vaping Use   Vaping Use: Never used  Substance and Sexual Activity   Alcohol use: No   Drug use: No   Sexual activity: Not Currently    Birth control/protection: Surgical    Comment: tubal  Other Topics Concern   Not on file  Social History Narrative   Daughter in [place]   Lives by herself   Worked for [employer] for 20+ years   Social Determinants of Health   Financial Resource Strain: Not on file  Food Insecurity: Not on file  Transportation Needs: Not on file  Physical Activity:  Not on file  Stress: Not on file  Social Connections: Not on file     Family History: The patient's family history includes Appendicitis in her father and mother; Heart disease in her father, maternal grandmother, and mother.  ROS:   Please see the history of present illness.    All other systems reviewed and are negative.  EKGs/Labs/Other Studies Reviewed:    The following studies were reviewed today:  02/20/2022 Echo EF 60 RV normal Mildly dilated atria  10/30/2020  SPECT No ischemia   ALL ECGs reviewed in Epic and show AF.   Recent Labs: 02/27/2022: BNP 175.4 05/27/2022: ALT 24; BUN 21; Creatinine, Ser 0.98; Hemoglobin 12.2; Platelets 196.0; Potassium 4.6; Sodium 142  Recent Lipid Panel    Component Value Date/Time   CHOL 236 (H) 08/08/2020 0935   TRIG 120 08/08/2020 0935   HDL 45 (L) 08/08/2020 0935   CHOLHDL 5.2 (H) 08/08/2020 0935   LDLCALC 166 (H) 08/08/2020 0935    Physical Exam:    VS:  There were no vitals taken for this visit.    Wt Readings from Last 3 Encounters:  05/27/22 244 lb 8 oz (110.9 kg)  04/30/22 249 lb (112.9 kg)  04/15/22 244 lb (110.7 kg)     GEN: *** Well nourished, well developed in no acute distress HEENT: Normal NECK: No JVD; No carotid bruits LYMPHATICS: No lymphadenopathy CARDIAC: ***RRR, no murmurs, rubs, gallops RESPIRATORY:  Clear to auscultation without rales, wheezing or rhonchi  ABDOMEN: Soft, non-tender, non-distended MUSCULOSKELETAL:  No edema; No deformity  SKIN: Warm and dry NEUROLOGIC:  Alert and oriented x 3 PSYCHIATRIC:  Normal affect       ASSESSMENT:    No diagnosis found. PLAN:    In order of problems listed above:  #Longstanding persistent AF Previous flutter ablation. Unclear duration of her AF but seems like it ongoing at least for the past 1-2 years. I would like to see if she will maintain normal rhythm for any period of time as this will guide whether or not rhythm control strategies (catheter ablation) will be available.  Tikosyn and DCCV. If she feels better in sinus, plan for ablation.   ***watchman  Follow up in 3 months to finalize plan.       Total time spent with patient today *** minutes. This includes reviewing records, evaluating the patient and coordinating care.  Medication Adjustments/Labs and Tests Ordered: Current medicines are reviewed at length with the patient today.  Concerns regarding medicines are outlined above.  No orders of the defined  types were placed in this encounter.  No orders of the defined types were placed in this encounter.    Signed, Rossie Muskrat. Lalla Brothers, MD, [facility] 06/04/2022 6:07 AM    Electrophysiology [facility]

## 2022-06-04 NOTE — Progress Notes (Signed)
Electrophysiology Office Note:    Date:  06/04/2022   ID:  Tara Collier, DOB Sep 14, 1945, MRN [id number]  PCP:  Inda Coke, [facility] Cardiologist:  Elouise Munroe, MD  [facility] Electrophysiologist:  None   Referring MD: Warren Lacy, PA*   Chief Complaint: AF  History of Present Illness:    Tara Collier is a 76 y.o. female who presents for an evaluation of AF at the request of Caron Presume, PA-C and Dr Margaretann Loveless. Their medical history includes HTN, HLD, AF. She underwent an AFL ablation in 2013 and then was managed with propafenone. This medication was self discontinued. In June 2022 she was referred to the AF clinic by Dr Margaretann Loveless after reporting worsening shortness of breath and palpitations. She was admitted in June with symptomatic anemia. EGD/colon revealed an angiodysplasia in the duodenum treated with APC.   Today  She doesn't know the last time she had normal rhythm. She has been in Afib for at least 2 years. She was cardioverted at [facility], which did not put her out of Afib. The doctor there prescribed her propafenone.  A year ago, her hemoglobin was low, and she was given a blood transfusion. Before, she was hardly able to walk, but she felt much better after the transfusion. Her hemoglobin is now at 12.  She had a recent ER visit in which she was given Lasix, and was given a colonoscopy. They attempted to find GI bleeding causing the low hemoglobin.  In her recent hospitalization, her heart rate was around 50 bpm, but it would often drop when she was resting alone in the room. The monitor would then alarm, which brought in doctors and nurses, and her HR would raise. After they left, her heart rate would lower again, which led to a frustrating cycle.   She has swelling in her feet. She also feels a "burning" sensation in her feet. This has a ripple effect upwards and affects her ability to move around.  She is compliant with her Eliquis,  and takes it twice a day.   She has been checking her blood pressure at home, but not as often lately.   She has a sleep study scheduled for next Wednesday.    She denies any chest pain, or shortness of breath. No headaches, syncope, orthopnea, or PND.      Past Medical History:  Diagnosis Date   Abnormal uterine bleeding    Arthritis    Atrial fibrillation ([place]) 06/03/2013   Last Assessment & Plan:  Formatting of this note might be different from the original. Continue propafenone for rhythm control .Patient has clear symptomatic benefit with sinus rhythm maintenance. CHADS-Vasc score over 2, continue anticoagulation with eliquis.   HTN (hypertension) 06/03/2013   Last Assessment & Plan:  Formatting of this note might be different from the original. Hypertension is well controlled.  Continue current medical regimen and low sodium diet.  Reevaluate at next office visit   Hypercholesterolemia    Restless leg syndrome    Urinary incontinence 08/08/2020    Past Surgical History:  Procedure Laterality Date   CARDIOVERSION     COLONOSCOPY WITH PROPOFOL N/A 02/20/2022   Procedure: COLONOSCOPY WITH PROPOFOL;  Surgeon: Irene Shipper, MD;  Location: [place];  Service: Gastroenterology;  Laterality: N/A;   ESOPHAGOGASTRODUODENOSCOPY (EGD) WITH PROPOFOL N/A 02/20/2022   Procedure: ESOPHAGOGASTRODUODENOSCOPY (EGD) WITH PROPOFOL;  Surgeon: Irene Shipper, MD;  Location: [place];  Service: Gastroenterology;  Laterality: N/A;  HOT HEMOSTASIS N/A 02/20/2022   Procedure: HOT HEMOSTASIS (ARGON PLASMA COAGULATION/BICAP);  Surgeon: Hilarie Fredrickson, MD;  Location: [facility];  Service: Gastroenterology;  Laterality: N/A;   RADIOFREQUENCY ABLATION     ROTATOR CUFF REPAIR     TUBAL LIGATION      Current Medications: Current Meds  Medication Sig   acetaminophen (TYLENOL) 325 MG tablet Take 650 mg by mouth every 6 (six) hours as needed for moderate pain or headache.   acetaminophen (TYLENOL)  500 MG tablet Take 500 mg by mouth every 6 (six) hours as needed for mild pain or moderate pain.   apixaban (ELIQUIS) 5 MG TABS tablet Take 1 tablet (5 mg total) by mouth 2 (two) times daily. Start taking tomorrow   Ascorbic Acid (VITAMIN C) 1000 MG tablet Take 1,000 mg by mouth daily.   atorvastatin (LIPITOR) 40 MG tablet Take 1 tablet (40 mg total) by mouth daily.   carvedilol (COREG) 12.5 MG tablet Take 1 tablet (12.5 mg total) by mouth 2 (two) times daily with a meal.   diclofenac Sodium (VOLTAREN) 1 % GEL Apply 2 g topically 4 (four) times daily as needed (knee pain).   ferrous sulfate 325 (65 FE) MG tablet Take 1 tablet (325 mg total) by mouth daily.   furosemide (LASIX) 20 MG tablet Take 1 tablet (20 mg total) by mouth daily. (Patient taking differently: Take 10 mg by mouth daily.)   gabapentin (NEURONTIN) 100 MG capsule Take 1 capsule (100 mg total) by mouth at bedtime.   losartan (COZAAR) 100 MG tablet Take 1 tablet (100 mg total) by mouth daily.   meclizine (ANTIVERT) 25 MG tablet Take 1 tablet (25 mg total) by mouth 3 (three) times daily as needed for dizziness.   MYRBETRIQ 50 MG TB24 tablet Take 50 mg by mouth daily.   polyethylene glycol powder (GLYCOLAX/MIRALAX) 17 GM/SCOOP powder Take 17 g by mouth daily as needed for mild constipation.   senna (SENOKOT) 8.6 MG TABS tablet Take 2 tablets by mouth at bedtime as needed for mild constipation.     Allergies:   Tape, Bee venom, and Norvasc [amlodipine]   Social History   Socioeconomic History   Marital status: Single    Spouse name: Not on file   Number of children: Not on file   Years of education: Not on file   Highest education level: Not on file  Occupational History   Not on file  Tobacco Use   Smoking status: Never   Smokeless tobacco: Never  Vaping Use   Vaping Use: Never used  Substance and Sexual Activity   Alcohol use: No   Drug use: No   Sexual activity: Not Currently    Birth control/protection: Surgical     Comment: tubal  Other Topics Concern   Not on file  Social History Narrative   Daughter in [place]   Lives by herself   Worked for [employer] for 20+ years   Social Determinants of Health   Financial Resource Strain: Not on file  Food Insecurity: Not on file  Transportation Needs: Not on file  Physical Activity: Not on file  Stress: Not on file  Social Connections: Not on file     Family History: The patient's family history includes Appendicitis in her father and mother; Heart disease in her father, maternal grandmother, and mother.  ROS:   Please see the history of present illness.     (+) Persistent Afib (+) Fatigue (+) LE Edema (+) "Burning"  in feet  All other systems reviewed and are negative.  EKGs/Labs/Other Studies Reviewed:    The following studies were reviewed today:  02/20/2022 Echo EF 60 RV normal Mildly dilated atria  10/30/2020 SPECT No ischemia   ALL ECGs reviewed in Epic and show AF.   Recent Labs: 02/27/2022: BNP 175.4 05/27/2022: ALT 24; BUN 21; Creatinine, Ser 0.98; Hemoglobin 12.2; Platelets 196.0; Potassium 4.6; Sodium 142  Recent Lipid Panel    Component Value Date/Time   CHOL 236 (H) 08/08/2020 0935   TRIG 120 08/08/2020 0935   HDL 45 (L) 08/08/2020 0935   CHOLHDL 5.2 (H) 08/08/2020 0935   LDLCALC 166 (H) 08/08/2020 0935    Physical Exam:    VS:  BP 138/82   Pulse 64   Ht 5\' 3"  (1.6 m)   Wt 241 lb (109.3 kg)   SpO2 95%   BMI 42.69 kg/m     Wt Readings from Last 3 Encounters:  06/04/22 241 lb (109.3 kg)  05/27/22 244 lb 8 oz (110.9 kg)  04/30/22 249 lb (112.9 kg)     GEN: Well nourished, well developed in no acute distress. Elderly. Obese. HEENT: Normal NECK: No JVD; No carotid bruits LYMPHATICS: No lymphadenopathy CARDIAC: irregularly irregular, no murmurs, rubs, gallops RESPIRATORY:  Clear to auscultation without rales, wheezing or rhonchi  ABDOMEN: Soft, non-tender, non-distended MUSCULOSKELETAL:  No edema; No  deformity  SKIN: Warm and dry NEUROLOGIC:  Alert and oriented x 3 PSYCHIATRIC:  Normal affect       ASSESSMENT:    1. Persistent atrial fibrillation (HCC)   2. Primary hypertension   3. Anemia, unspecified type    PLAN:    In order of problems listed above:  #Longstanding persistent AF Previous flutter ablation. Unclear duration of her AF but seems like it ongoing at least for the past 1-2 years. I would like to see if she will maintain normal rhythm for any period of time as this will guide whether or not rhythm control strategies (catheter ablation) will be available.  I discussed dofetilide loading in detail including the need for inpatient hospitalization and the associated risks and she wishes to proceed.  Tikosyn and DCCV. If she feels better in sinus, consider ablation.   Long term, can consider LAAO if anemia is recurrent. I would like to address her arrhythmia first.  Follow up in 3 months to finalize plan.   Total time spent with patient today 60 minutes. This includes reviewing records, evaluating the patient and coordinating care.  Medication Adjustments/Labs and Tests Ordered: Current medicines are reviewed at length with the patient today.  Concerns regarding medicines are outlined above.  No orders of the defined types were placed in this encounter.  No orders of the defined types were placed in this encounter.   I,Mary 06/30/22 [place],acting as a scribe for Shauna Hugh, MD.,have documented all relevant documentation on the behalf of Lanier Prude, MD,as directed by  Lanier Prude, MD while in the presence of Lanier Prude, MD.   I, Lanier Prude, MD, have reviewed all documentation for this visit. The documentation on 06/04/22 for the exam, diagnosis, procedures, and orders are all accurate and complete.    Signed, 08/04/22. Rossie Muskrat, MD, [facility], [facility] 06/04/2022 5:08 PM    Electrophysiology [facility]

## 2022-06-04 NOTE — Patient Instructions (Signed)
Medication Instructions:  None  *If you need a refill on your cardiac medications before your next appointment, please call your pharmacy*   Lab Work: None  If you have labs (blood work) drawn today and your tests are completely normal, you will receive your results only by: [place] (if you have MyChart) OR A paper copy in the mail If you have any lab test that is abnormal or we need to change your treatment, we will call you to review the results.   Testing/Procedures: None    Follow-Up: At [facility], you and your health needs are our priority.  As part of our continuing mission to provide you with exceptional heart care, we have created designated Provider Care Teams.  These Care Teams include your primary Cardiologist (physician) and Advanced Practice Providers (APPs -  Physician Assistants and Nurse Practitioners) who all work together to provide you with the care you need, when you need it.  We recommend signing up for the patient portal called "MyChart".  Sign up information is provided on this After Visit Summary.  MyChart is used to connect with patients for Virtual Visits (Telemedicine).  Patients are able to view lab/test results, encounter notes, upcoming appointments, etc.  Non-urgent messages can be sent to your provider as well.   To learn more about what you can do with MyChart, go to [email or web address].    Your next appointment:   Erline Levine in the [facility] will contact you for Tikosyn admission.   The format for your next appointment:   In Person  Provider:   You will follow up in the [facility] located at [facility]. Your provider will be: Clint R. Fenton, PA-C    Other Instructions Tikosyn (Dofetilide) Hospital Admission   Prior to day of admission:  Check with drug insurance company for cost of drug to ensure affordability --- Dofetilide 500 mcg twice a day.  GoodRx is an option if insurance copay is  unaffordable.    No Benadryl is allowed 3 days prior to admission.   Please ensure no missed doses of your anticoagulation (blood thinner) for 3 weeks prior to admission. If a dose is missed please notify our office immediately.   A pharmacist will review all your medications for potential interactions with Tikosyn. If any medication changes are needed prior to admission we will be in touch with you.   If any new medications are started AFTER your admission date is set with Nurse, adult. Please notify our office immediately so your medication list can be updated and reviewed by our pharmacist again.  On day of admission:  Tikosyn initiation requires a 3 night/4 day hospital stay with constant telemetry monitoring. You will have an EKG after each dose of Tikosyn as well as daily lab draws.   If the drug does not convert you to normal rhythm a cardioversion after the 4th dose of Tikosyn.   [facility] office visit on the morning of admission is needed for preliminary labs/ekg.   Time of admission is dependent on bed availability in the hospital. In some instances, you will be sent home until bed is available. Rarely admission can be delayed to the following day if hospital census prevents available beds.   You may bring personal belongings/clothing with you to the hospital. Please leave your suitcase in the car until you arrive in admissions.   Questions please call our office at [place]

## 2022-06-05 ENCOUNTER — Telehealth: Payer: Self-pay | Admitting: Pharmacist

## 2022-06-05 NOTE — Telephone Encounter (Signed)
-----   Message from Juluis Mire, RN sent at 06/05/2022  8:30 AM EDT ----- Please review meds for tikosyn thanks stacy ----- Message ----- From: Darrell Jewel, RN Sent: 06/05/2022   8:14 AM EDT To: Juluis Mire, RN; Barton Fanny Seyam  Good morning,  Tikosyn work up per Dr. Quentin Ore.  Thank you, Annabell Sabal

## 2022-06-05 NOTE — Telephone Encounter (Signed)
Medication list reviewed in anticipation of upcoming Tikosyn initiation. Patient is not taking any contraindicated or QTc prolonging medications.   Patient is anticoagulated on Eliquis on the appropriate dose. Please ensure that patient has not missed any anticoagulation doses in the 3 weeks prior to Tikosyn initiation.   Patient will need to be counseled to avoid use of Benadryl while on Tikosyn and in the 2-3 days prior to Tikosyn initiation.  

## 2022-06-11 ENCOUNTER — Telehealth: Payer: Self-pay | Admitting: Neurology

## 2022-06-11 ENCOUNTER — Ambulatory Visit (HOSPITAL_BASED_OUTPATIENT_CLINIC_OR_DEPARTMENT_OTHER): Payer: Medicare HMO | Attending: Physician Assistant | Admitting: Cardiovascular Disease

## 2022-06-11 DIAGNOSIS — I4821 Permanent atrial fibrillation: Secondary | ICD-10-CM | POA: Insufficient documentation

## 2022-06-11 DIAGNOSIS — G4733 Obstructive sleep apnea (adult) (pediatric): Secondary | ICD-10-CM | POA: Diagnosis not present

## 2022-06-11 DIAGNOSIS — I5033 Acute on chronic diastolic (congestive) heart failure: Secondary | ICD-10-CM | POA: Diagnosis not present

## 2022-06-11 DIAGNOSIS — I10 Essential (primary) hypertension: Secondary | ICD-10-CM | POA: Insufficient documentation

## 2022-06-11 DIAGNOSIS — G4730 Sleep apnea, unspecified: Secondary | ICD-10-CM | POA: Diagnosis present

## 2022-06-11 DIAGNOSIS — I493 Ventricular premature depolarization: Secondary | ICD-10-CM | POA: Diagnosis not present

## 2022-06-11 NOTE — Telephone Encounter (Signed)
Just an FYI patient has a split sleep study scheduled from Caron Presume, [place] on 06/11/22. I will cancel Dr. Brett Fairy order.

## 2022-06-13 NOTE — Telephone Encounter (Signed)
Left message

## 2022-06-21 ENCOUNTER — Encounter (HOSPITAL_BASED_OUTPATIENT_CLINIC_OR_DEPARTMENT_OTHER): Payer: Self-pay | Admitting: Cardiovascular Disease

## 2022-06-21 NOTE — Procedures (Signed)
Patient Name: Tara Collier, Tara Collier Date: 06/11/2022 Gender: Female D.O.B: 01-05-1946 Age (years): 75 Referring Provider: Caron Presume PA-C Height (inches): 62 Interpreting Physician: Shelva Majestic MD, ABSM Weight (lbs): 244 RPSGT: Jorge Ny BMI: 45 MRN: [id number] Neck Size: 15.50  CLINICAL INFORMATION Sleep Study Type: Split Night CPAP  Indication for sleep study: Congestive Heart Failure, Fatigue, Hypertension, Obesity, Snoring, Atrial Fibrillation  Epworth Sleepiness Score: 8  SLEEP STUDY TECHNIQUE As per the AASM Manual for the Scoring of Sleep and Associated Events v2.3 (April 2016) with a hypopnea requiring 4% desaturations.  The channels recorded and monitored were frontal, central and occipital EEG, electrooculogram (EOG), submentalis EMG (chin), nasal and oral airflow, thoracic and abdominal wall motion, anterior tibialis EMG, snore microphone, electrocardiogram, and pulse oximetry. Continuous positive airway pressure (CPAP) was initiated when the patient met split night criteria and was titrated according to treat sleep-disordered breathing.  MEDICATIONS acetaminophen (TYLENOL) 500 MG tablet apixaban (ELIQUIS) 5 MG TABS tablet Ascorbic Acid (VITAMIN C) 1000 MG tablet atorvastatin (LIPITOR) 40 MG tablet carvedilol (COREG) 12.5 MG tablet diclofenac Sodium (VOLTAREN) 1 % GEL ferrous sulfate 325 (65 FE) MG tablet furosemide (LASIX) 20 MG tablet gabapentin (NEURONTIN) 100 MG capsule losartan (COZAAR) 100 MG tablet meclizine (ANTIVERT) 25 MG tablet MYRBETRIQ 50 MG TB24 tablet polyethylene glycol powder (GLYCOLAX/MIRALAX) 17 GM/SCOOP powder Medications self-administered by patient taken the night of the study : Eliquis, CARVEDILOL, Diclofenac Sodium, GABAPENTIN, TYLENOL  RESPIRATORY PARAMETERS Diagnostic Total AHI (/hr): 60.6 RDI (/hr): 63.2 OA Index (/hr): 6 CA Index (/hr): 0.0 REM AHI (/hr): 73.4 NREM AHI (/hr): 57.6 Supine AHI  (/hr): N/A Non-supine AHI (/hr): 60.6 Min O2 Sat (%): 76.0 Mean O2 (%): 91.4 Time below 88% (min): 18.7   Titration Optimal Pressure (cm):  AHI at Optimal Pressure (/hr): N/A Min O2 at Optimal Pressure (%): 85.0 Supine % at Optimal (%): N/A Sleep % at Optimal (%): N/A   SLEEP ARCHITECTURE The recording time for the entire night was 422.5 minutes.  During a baseline period of 193.7 minutes, the patient slept for 139.7 minutes in REM and nonREM, yielding a sleep efficiency of 72.1%%. Sleep onset after lights out was 37.6 minutes with a REM latency of 118.0 minutes. The patient spent 10.4%% of the night in stage N1 sleep, 70.9%% in stage N2 sleep, 0.0%% in stage N3 and 18.7% in REM.  During the titration period of 214.5 minutes, the patient slept for 133.4 minutes in REM and nonREM, yielding a sleep efficiency of 62.2%%. Sleep onset after CPAP initiation was 64.6 minutes with a REM latency of 80.5 minutes. The patient spent 7.9%% of the night in stage N1 sleep, 77.1%% in stage N2 sleep, 0.0%% in stage N3 and 15% in REM.  CARDIAC DATA The 2 lead EKG demonstrated sinus rhythm. The mean heart rate was 100.0 beats per minute. Other EKG findings include: PVCs.  LEG MOVEMENT DATA The total Periodic Limb Movements of Sleep (PLMS) were 0. The PLMS index was 0.0 .  IMPRESSIONS - Severe obstructive sleep apnea occurred during the diagnostic portion of the study (AHI 60.6/h; RDI 63.2/h; REM AHI 73.4/h). CPAP waas initiated at 5 cm and was titrated to 13 cm of water (AHI 0; O2 nadir 92% with absent REM sleep) - No significant central sleep apnea occurred during the diagnostic portion of the study (CAI 0.0/hour). - Moderate oxygen desaturation was noted during the diagnostic portion of the study to a nadir of 76.0%. - The patient snored with moderate snoring volume  during the diagnostic portion of the study. - EKG findings include atrial fibrillation, PVCs. - Clinically significant periodic limb movements  did not occur during sleep.  DIAGNOSIS - Obstructive Sleep Apnea (G47.33)  RECOMMENDATIONS - Recommend an initial trial of CPAP Auto with EPR of 3 at 11 - 18  cm of water. - Effort should be made to optimize nasal and oropharyngeal patency. - Avoid alcohol, sedatives and other CNS depressants that may worsen sleep apnea and disrupt normal sleep architecture. - Sleep hygiene should be reviewed to assess factors that may improve sleep quality. - Weight management and regular exercise should be initiated or continued. - Recommend a download and sleep clinic evaluation after 4 weeks of therapy  [Electronically signed] 06/21/2022 05:52 PM  Shelva Majestic MD, [facility], ABSM Diplomate, American Board of Sleep Medicine  NPI: [id number]  [place] PH: [phone]   FX: [phone] [place]

## 2022-06-25 ENCOUNTER — Other Ambulatory Visit (HOSPITAL_COMMUNITY): Payer: Self-pay | Admitting: Physician Assistant

## 2022-07-01 ENCOUNTER — Telehealth: Payer: Self-pay | Admitting: *Deleted

## 2022-07-01 NOTE — Telephone Encounter (Signed)
-----   Message from Thomas A Kelly, MD sent at 06/21/2022  5:56 PM EDT ----- Tarika Mckethan, please notify pt and initiate CPAP with DME 

## 2022-07-01 NOTE — Telephone Encounter (Signed)
Message left to return a call. 

## 2022-07-11 ENCOUNTER — Encounter (HOSPITAL_COMMUNITY): Payer: Self-pay | Admitting: *Deleted

## 2022-07-11 NOTE — Telephone Encounter (Signed)
Letter sent.

## 2022-07-16 NOTE — Progress Notes (Deleted)
GYNECOLOGY  VISIT   HPI: 76 y.o.   Single  Caucasian  female   [id number] with No LMP recorded. Patient is postmenopausal.   here for  repeat endo bx   GYNECOLOGIC HISTORY: No LMP recorded. Patient is postmenopausal. Contraception:  PMP Menopausal hormone therapy:  n/a Last mammogram:  *** Last pap smear:   04/07/22 negative: HR HPV negative        OB History     Gravida  3   Para  2   Term      Preterm      AB  1   Living  2      SAB  1   IAB      Ectopic      Multiple      Live Births                 Patient Active Problem List   Diagnosis Date Noted   Sleep apnea 04/30/2022   Snoring 04/30/2022   Class 3 severe obesity due to excess calories without serious comorbidity with body mass index (BMI) of 40.0 to 44.9 in adult (HCC) 04/30/2022   Sleep walkings 04/30/2022   SOB (shortness of breath) on exertion 04/30/2022   Nocturia 04/30/2022   Paroxysmal atrial fibrillation (HCC) 04/30/2022   Weakness 02/20/2022   Angiodysplasia of duodenum with hemorrhage    Symptomatic anemia 02/18/2022   Uterine bleeding 02/18/2022   Acute on chronic diastolic CHF (congestive heart failure) (HCC) 02/18/2022   Secondary hypercoagulable state (HCC) 10/15/2020   Urinary incontinence 08/08/2020   Arthritis    Hypercholesterolemia    Restless leg syndrome    Atrial fibrillation (HCC) 06/03/2013   HTN (hypertension) 06/03/2013    Past Medical History:  Diagnosis Date   Abnormal uterine bleeding    Arthritis    Atrial fibrillation (HCC) 06/03/2013   Last Assessment & Plan:  Formatting of this note might be different from the original. Continue propafenone for rhythm control .Patient has clear symptomatic benefit with sinus rhythm maintenance. CHADS-Vasc score over 2, continue anticoagulation with eliquis.   HTN (hypertension) 06/03/2013   Last Assessment & Plan:  Formatting of this note might be different from the original. Hypertension is well controlled.  Continue  current medical regimen and low sodium diet.  Reevaluate at next office visit   Hypercholesterolemia    Restless leg syndrome    Urinary incontinence 08/08/2020    Past Surgical History:  Procedure Laterality Date   CARDIOVERSION     COLONOSCOPY WITH PROPOFOL N/A 02/20/2022   Procedure: COLONOSCOPY WITH PROPOFOL;  Surgeon: Hilarie Fredrickson, MD;  [facility] ENDOSCOPY;  Service: Gastroenterology;  Laterality: N/A;   ESOPHAGOGASTRODUODENOSCOPY (EGD) WITH PROPOFOL N/A 02/20/2022   Procedure: ESOPHAGOGASTRODUODENOSCOPY (EGD) WITH PROPOFOL;  Surgeon: Hilarie Fredrickson, MD;  Location: [facility] ENDOSCOPY;  Service: Gastroenterology;  Laterality: N/A;   HOT HEMOSTASIS N/A 02/20/2022   Procedure: HOT HEMOSTASIS (ARGON PLASMA COAGULATION/BICAP);  Surgeon: Hilarie Fredrickson, MD;  Location: [facility] ENDOSCOPY;  Service: Gastroenterology;  Laterality: N/A;   RADIOFREQUENCY ABLATION     ROTATOR CUFF REPAIR     TUBAL LIGATION      Current Outpatient Medications  Medication Sig Dispense Refill   acetaminophen (TYLENOL) 325 MG tablet Take 650 mg by mouth every 6 (six) hours as needed for moderate pain or headache.     acetaminophen (TYLENOL) 500 MG tablet Take 500 mg by mouth every 6 (six) hours as needed for mild pain or moderate pain.  apixaban (ELIQUIS) 5 MG TABS tablet Take 1 tablet (5 mg total) by mouth 2 (two) times daily. Start taking tomorrow 60 tablet 1   Ascorbic Acid (VITAMIN C) 1000 MG tablet Take 1,000 mg by mouth daily.     atorvastatin (LIPITOR) 40 MG tablet Take 1 tablet (40 mg total) by mouth daily. 90 tablet 1   carvedilol (COREG) 12.5 MG tablet Take 1 tablet (12.5 mg total) by mouth 2 (two) times daily with a meal. 60 tablet 0   diclofenac Sodium (VOLTAREN) 1 % GEL Apply 2 g topically 4 (four) times daily as needed (knee pain). 50 g 1   ferrous sulfate 325 (65 FE) MG tablet Take 1 tablet (325 mg total) by mouth daily. 30 tablet 1   furosemide (LASIX) 20 MG tablet Take 1 tablet (20 mg total) by mouth daily.  90 tablet 3   gabapentin (NEURONTIN) 100 MG capsule Take 1 capsule (100 mg total) by mouth at bedtime. 90 capsule 1   losartan (COZAAR) 100 MG tablet Take 1 tablet (100 mg total) by mouth daily. 90 tablet 1   meclizine (ANTIVERT) 25 MG tablet Take 1 tablet (25 mg total) by mouth 3 (three) times daily as needed for dizziness. 30 tablet 3   MYRBETRIQ 50 MG TB24 tablet Take 50 mg by mouth daily.     polyethylene glycol powder (GLYCOLAX/MIRALAX) 17 GM/SCOOP powder Take 17 g by mouth daily as needed for mild constipation.     senna (SENOKOT) 8.6 MG TABS tablet Take 2 tablets by mouth at bedtime as needed for mild constipation.     No current facility-administered medications for this visit.     ALLERGIES: Tape, Bee venom, and Norvasc [amlodipine]  Family History  Problem Relation Age of Onset   Heart disease Mother    Appendicitis Mother    Appendicitis Father    Heart disease Father    Heart disease Maternal Grandmother     Social History   Socioeconomic History   Marital status: Single    Spouse name: Not on file   Number of children: Not on file   Years of education: Not on file   Highest education level: Not on file  Occupational History   Not on file  Tobacco Use   Smoking status: Never   Smokeless tobacco: Never  Vaping Use   Vaping Use: Never used  Substance and Sexual Activity   Alcohol use: No   Drug use: No   Sexual activity: Not Currently    Birth control/protection: Surgical    Comment: tubal  Other Topics Concern   Not on file  Social History Narrative   Daughter in [place]   Lives by herself   Worked for [employer] for 20+ years   Social Determinants of Health   Financial Resource Strain: Not on file  Food Insecurity: Not on file  Transportation Needs: Not on file  Physical Activity: Not on file  Stress: Not on file  Social Connections: Not on file  Intimate Partner Violence: Not on file    Review of Systems  PHYSICAL EXAMINATION:    There were no  vitals taken for this visit.    General appearance: alert, cooperative and appears stated age Head: Normocephalic, without obvious abnormality, atraumatic Neck: no adenopathy, supple, symmetrical, trachea midline and thyroid normal to inspection and palpation Lungs: clear to auscultation bilaterally Breasts: normal appearance, no masses or tenderness, No nipple retraction or dimpling, No nipple discharge or bleeding, No axillary or supraclavicular adenopathy  Heart: regular rate and rhythm Abdomen: soft, non-tender, no masses,  no organomegaly Extremities: extremities normal, atraumatic, no cyanosis or edema Skin: Skin color, texture, turgor normal. No rashes or lesions Lymph nodes: Cervical, supraclavicular, and axillary nodes normal. No abnormal inguinal nodes palpated Neurologic: Grossly normal  Pelvic: External genitalia:  no lesions              Urethra:  normal appearing urethra with no masses, tenderness or lesions              Bartholins and Skenes: normal                 Vagina: normal appearing vagina with normal color and discharge, no lesions              Cervix: no lesions                Bimanual Exam:  Uterus:  normal size, contour, position, consistency, mobility, non-tender              Adnexa: no mass, fullness, tenderness              Rectal exam: {yes no:[id number]}.  Confirms.              Anus:  normal sphincter tone, no lesions  Chaperone was present for exam:  ***  ASSESSMENT     PLAN     An After Visit Summary was printed and given to the patient.  ______ minutes face to face time of which over 50% was spent in counseling.

## 2022-07-22 ENCOUNTER — Ambulatory Visit: Payer: Medicare HMO | Admitting: Obstetrics and Gynecology

## 2022-07-23 ENCOUNTER — Telehealth: Payer: Self-pay | Admitting: *Deleted

## 2022-07-23 NOTE — Telephone Encounter (Signed)
I left message for Tara Collier ( patient daughter to call)  Dr.Silva please see the below note.

## 2022-07-23 NOTE — Telephone Encounter (Signed)
-----   Message from Lillia Carmel, [place] sent at 07/22/2022 12:13 PM EST ----- Patients daughter Kenney Houseman called to cancel appointment. Note on appt desk states "PLEASE LET DR. SILVA KNOW IF PATIENT CANCELS THIS APPOINTMENT! ka/Dr. Edward Jolly".  Patients daughter stated patient is feeling reserved about coming in again for the procedure, stating her last appt was uncomfortable during procedure. States patient is also just wanting to know why it needs to be done again and is having a few more questions. Patients daughter states she needs to discuss further with patient if she wants to reschedule sometime in the future and that maybe she needs to come in to speak further and ask Dr. Edward Jolly more questions.  Also states that repeat MRI is scheduled for January and she will be keeping that appointment for now.

## 2022-07-23 NOTE — Telephone Encounter (Signed)
Biopsy of the endometrium is recommended due to her history of recurrent postmenopausal bleeding, which can be a sign of cancer or precancer of the uterus.    The patient's prior endometrial biopsy was not diagnostic, meaning it did not capture enough tissue to make a diagnosis for or against endometrial pathology, including cancer.   I would recommend that the patient proceed with a repeat office endometrial biopsy or do an outpatient surgery called hysteroscopy with dilation and curettage.   If she would like to do the office procedure, I will use lidocaine to reduce discomfort, but it will not take away all of her pain and discomfort with the procedure.  The discomfort is usually very brief.  She can also take Tylenol prior to her procedure.

## 2022-07-24 ENCOUNTER — Other Ambulatory Visit: Payer: Self-pay | Admitting: Physician Assistant

## 2022-07-25 NOTE — Telephone Encounter (Signed)
Message left for Kenney Houseman (patient daughter to call)

## 2022-07-27 ENCOUNTER — Other Ambulatory Visit: Payer: Self-pay | Admitting: Physician Assistant

## 2022-07-28 NOTE — Telephone Encounter (Signed)
Pt requesting refill for Gabapentin 100 mg at bedtime. Last OV 05/27/2022.

## 2022-08-04 ENCOUNTER — Telehealth: Payer: Self-pay | Admitting: Obstetrics and Gynecology

## 2022-08-04 NOTE — Telephone Encounter (Signed)
Please send letter to patient recommending endometrial biopsy for recurrent postmenopausal bleeding.   Patient had a previous non-diagnostic endometrial biopsy in the office.   She cancelled her appointment for the repeat biopsy in November.   Postmenopausal bleeding can be a sign of precancer or cancer.   Her evaluation is incomplete and is not replaced by her upcoming MRI of the pelvis.

## 2022-08-06 NOTE — Telephone Encounter (Signed)
Left message for patient daughter Kenney Houseman to call again.

## 2022-08-06 NOTE — Telephone Encounter (Signed)
Letter pended.  Copy of letter to Dr. Edward Jolly to review.

## 2022-08-07 NOTE — Telephone Encounter (Signed)
Letter reviewed and signed by Dr. Edward Jolly.  EMB order cancelled.  Mailed to address on file. Encounter closed.

## 2022-08-21 NOTE — Telephone Encounter (Signed)
Spoke with patient daughter Kenney Houseman again and stressed the below. Kenney Houseman said she will discuss with patient, I informed Kenney Houseman if patient would prefer a visit to discuss with Dr.Silva she can do this as well if this would make patient feel more comfortable about the importance of endo biopsy. I asked Kenney Houseman to let us know either way if patient would like to proceed with biopsy or a visit with Dr.Silva to discuss.   Patient is also scheduled for pelvic Mri on 08/30/22

## 2022-08-27 ENCOUNTER — Encounter: Payer: Self-pay | Admitting: Obstetrics and Gynecology

## 2022-08-29 ENCOUNTER — Encounter: Payer: Self-pay | Admitting: Obstetrics and Gynecology

## 2022-08-30 ENCOUNTER — Other Ambulatory Visit: Payer: Medicare HMO

## 2022-09-02 ENCOUNTER — Telehealth: Payer: Self-pay | Admitting: Obstetrics and Gynecology

## 2022-09-02 NOTE — Telephone Encounter (Signed)
Left message for patient's daughter, Lavella Lemons, to call me back.

## 2022-09-02 NOTE — Telephone Encounter (Signed)
Please contact patient in follow up to her cancelled pelvic MRI and repeat endometrial biopsy appointment.   She has had postmenopausal bleeding and has a uterine mass on her pelvic MRI.   She had a nondiagnostic endometrial biopsy.   If she does not wish to keep these appointments, I would recommend she receive a second opinion for her postmenopausal bleeding.  I am happy to refer her to GYN Oncology.

## 2022-09-25 ENCOUNTER — Ambulatory Visit: Payer: Medicare HMO | Attending: Internal Medicine | Admitting: Internal Medicine

## 2022-09-25 ENCOUNTER — Encounter: Payer: Self-pay | Admitting: Internal Medicine

## 2022-09-25 VITALS — BP 185/89 | HR 68 | Ht 62.0 in | Wt 240.8 lb

## 2022-09-25 DIAGNOSIS — R0609 Other forms of dyspnea: Secondary | ICD-10-CM

## 2022-09-25 DIAGNOSIS — I5033 Acute on chronic diastolic (congestive) heart failure: Secondary | ICD-10-CM | POA: Diagnosis not present

## 2022-09-25 DIAGNOSIS — Z79899 Other long term (current) drug therapy: Secondary | ICD-10-CM

## 2022-09-25 DIAGNOSIS — G4730 Sleep apnea, unspecified: Secondary | ICD-10-CM | POA: Diagnosis not present

## 2022-09-25 DIAGNOSIS — D649 Anemia, unspecified: Secondary | ICD-10-CM

## 2022-09-25 DIAGNOSIS — I4811 Longstanding persistent atrial fibrillation: Secondary | ICD-10-CM

## 2022-09-25 DIAGNOSIS — Z6841 Body Mass Index (BMI) 40.0 and over, adult: Secondary | ICD-10-CM | POA: Diagnosis not present

## 2022-09-25 DIAGNOSIS — I4819 Other persistent atrial fibrillation: Secondary | ICD-10-CM

## 2022-09-25 DIAGNOSIS — I10 Essential (primary) hypertension: Secondary | ICD-10-CM

## 2022-09-25 DIAGNOSIS — D6869 Other thrombophilia: Secondary | ICD-10-CM

## 2022-09-25 MED ORDER — ATORVASTATIN CALCIUM 40 MG PO TABS: 40.0000 mg | ORAL_TABLET | Freq: Every day | ORAL | 3 refills | Status: DC

## 2022-09-25 MED ORDER — APIXABAN 5 MG PO TABS: 5.0000 mg | ORAL_TABLET | Freq: Two times a day (BID) | ORAL | 5 refills | Status: DC

## 2022-09-25 MED ORDER — CARVEDILOL 12.5 MG PO TABS: 12.5000 mg | ORAL_TABLET | Freq: Two times a day (BID) | ORAL | 3 refills | Status: DC

## 2022-09-25 MED ORDER — LOSARTAN POTASSIUM 100 MG PO TABS: 100.0000 mg | ORAL_TABLET | Freq: Every day | ORAL | 3 refills | Status: DC

## 2022-09-25 NOTE — Progress Notes (Signed)
Cardiology Office Note:    Date:  09/25/2022   ID:  Tara Collier, DOB 1946-05-07, MRN [id number]  PCP:  Tara Coke, PA  Cardiologist:  Tara Munroe, MD  Electrophysiologist:  None   Referring MD: Tara Coke, PA   Chief Complaint/Reason for Referral: HTN, HLD, afib  History of Present Illness:    Tara Collier is a 77 y.o. female with a history of HTN, HLD, afib. Presents for further evaluation of afib. Presents with daughter who provides some of the history.   At her initial visit she complained of palpitations and shortness of breath. About two weeks prior she felt her heart pounding, and it was painful. She believes she is feeling them more frequently because of being more aware of them since her last appointment. We discussed participating in the Afib clinic to begin propafenone. Previously, she was taking propafenone in 2014.  We discussed division of expertise and the specialty of the A. fib clinic to coordinate care for patients with persistent atrial fibrillation particularly with left atrial dilation and management of antiarrhythmic agents.  I feel it is in her best interest to be managed by the A. fib clinic when it comes to antiarrhythmics and further options such as ablation.  They are frustrated by this, but I feel it is in the patient's best interest for drug monitoring and drug options.  Her nuclear stress test was nonischemic.  Last visit she had recently fallen in her yard due to difficulty ambulating. Her blood pressure was still elevated. She had been trying to improve her diet and exercise.  Today, the patient presents with her daughter to help provide history. She has been feeling a lot better overall. Though she has been having apprehension about afib management including medications and ablation, which we discussed to the best of my ability.   She had a recent sleep study where, she was found to have severe sleep apnea but hasn't followed up with CPAP  titration yet, upcoming.   Her blood pressure in clinic today is 185/89 in clinic.  She denies any palpitations, chest pain, shortness of breath, or peripheral edema. No lightheadedness, headaches, syncope, orthopnea, or PND.  Past Medical History:  Diagnosis Date   Abnormal uterine bleeding    Arthritis    Atrial fibrillation ([place]) 06/03/2013   Last Assessment & Plan:  Formatting of this note might be different from the original. Continue propafenone for rhythm control .Patient has clear symptomatic benefit with sinus rhythm maintenance. CHADS-Vasc score over 2, continue anticoagulation with eliquis.   HTN (hypertension) 06/03/2013   Last Assessment & Plan:  Formatting of this note might be different from the original. Hypertension is well controlled.  Continue current medical regimen and low sodium diet.  Reevaluate at next office visit   Hypercholesterolemia    Restless leg syndrome    Urinary incontinence 08/08/2020    Past Surgical History:  Procedure Laterality Date   CARDIOVERSION     COLONOSCOPY WITH PROPOFOL N/A 02/20/2022   Procedure: COLONOSCOPY WITH PROPOFOL;  Surgeon: Irene Shipper, MD;  Location: [facility];  Service: Gastroenterology;  Laterality: N/A;   ESOPHAGOGASTRODUODENOSCOPY (EGD) WITH PROPOFOL N/A 02/20/2022   Procedure: ESOPHAGOGASTRODUODENOSCOPY (EGD) WITH PROPOFOL;  Surgeon: Irene Shipper, MD;  Location: [facility];  Service: Gastroenterology;  Laterality: N/A;   HOT HEMOSTASIS N/A 02/20/2022   Procedure: HOT HEMOSTASIS (ARGON PLASMA COAGULATION/BICAP);  Surgeon: Irene Shipper, MD;  Location: [facility];  Service: Gastroenterology;  Laterality: N/A;   RADIOFREQUENCY ABLATION  ROTATOR CUFF REPAIR     TUBAL LIGATION      Current Medications: Current Meds  Medication Sig   acetaminophen (TYLENOL) 325 MG tablet Take 650 mg by mouth every 6 (six) hours as needed for moderate pain or headache.   acetaminophen (TYLENOL) 500 MG tablet Take 500 mg by mouth  every 6 (six) hours as needed for mild pain or moderate pain.   Ascorbic Acid (VITAMIN C) 1000 MG tablet Take 1,000 mg by mouth daily.   ferrous sulfate 325 (65 FE) MG tablet Take 1 tablet (325 mg total) by mouth daily.   furosemide (LASIX) 20 MG tablet Take 1 tablet (20 mg total) by mouth daily. (Patient taking differently: Take 10 mg by mouth daily.)   gabapentin (NEURONTIN) 100 MG capsule TAKE 1 CAPSULE AT BEDTIME   meclizine (ANTIVERT) 25 MG tablet Take 1 tablet (25 mg total) by mouth 3 (three) times daily as needed for dizziness.   MYRBETRIQ 50 MG TB24 tablet Take 50 mg by mouth daily.   polyethylene glycol powder (GLYCOLAX/MIRALAX) 17 GM/SCOOP powder Take 17 g by mouth daily as needed for mild constipation.   senna (SENOKOT) 8.6 MG TABS tablet Take 2 tablets by mouth at bedtime as needed for mild constipation.   [DISCONTINUED] apixaban (ELIQUIS) 5 MG TABS tablet Take 1 tablet (5 mg total) by mouth 2 (two) times daily. Start taking tomorrow   [DISCONTINUED] atorvastatin (LIPITOR) 40 MG tablet TAKE 1 TABLET (40 MG TOTAL) BY MOUTH DAILY.   [DISCONTINUED] carvedilol (COREG) 12.5 MG tablet Take 1 tablet (12.5 mg total) by mouth 2 (two) times daily with a meal.   [DISCONTINUED] diclofenac Sodium (VOLTAREN) 1 % GEL Apply 2 g topically 4 (four) times daily as needed (knee pain).   [DISCONTINUED] losartan (COZAAR) 100 MG tablet Take 1 tablet (100 mg total) by mouth daily.     Allergies:   Tape, Bee venom, and Norvasc [amlodipine]   Social History   Tobacco Use   Smoking status: Never   Smokeless tobacco: Never  Vaping Use   Vaping Use: Never used  Substance Use Topics   Alcohol use: No   Drug use: No     Family History: The patient's family history includes Appendicitis in her father and mother; Heart disease in her father, maternal grandmother, and mother.  ROS:   Please see the history of present illness.    (+) Fatigue All other systems reviewed and are  negative.  EKGs/Labs/Other Studies Reviewed:    The following studies were reviewed today:  [place] 10/30/2020: Nuclear stress EF: 63%. There was no ST segment deviation noted during stress. The study is normal. This is a low risk study. The left ventricular ejection fraction is normal (55-65%).   Normal stress nuclear study with no ischemia or infarction.  Gated ejection fraction 63% with normal wall motion.  Cardiac Telemetry Monitoring 10/23/2020: Patient wore monitor for 7 hours, for which she was in Afib for at least 94% of time. I do not clearly see sinus rhythm on monitor strips. Rate controlled atrial fibrillation.  E[place] 10/05/2020: 1. Left ventricular ejection fraction, by estimation, is 60 to 65%. The  left ventricle has normal function. The left ventricle has no regional  wall motion abnormalities. Left ventricular diastolic function could not  be evaluated.   2. Right ventricular systolic function is normal. The right ventricular  size is mildly enlarged. There is mildly elevated pulmonary artery  systolic pressure. The estimated right ventricular systolic pressure is  44.0 mmHg.   3. Right atrial size was moderately dilated.   4. The mitral valve is normal in structure. No evidence of mitral valve  regurgitation. No evidence of mitral stenosis.   5. The aortic valve is tricuspid. Aortic valve regurgitation is not  visualized. Mild aortic valve sclerosis is present, with no evidence of  aortic valve stenosis.   6. The inferior vena cava is normal in size with greater than 50%  respiratory variability, suggesting right atrial pressure of 3 mmHg.  EKG:  The EKG is personally reviewed 09/25/2022: Afib, septal infarct pattern, poor R wave progression. 02/19/2021: Afib, rate 69 bpm, septal infarct pattern. 09/13/2020: Afib, septal infarct pattern.   Recent Labs: 02/27/2022: BNP 175.4 05/27/2022: ALT 24; BUN 21; Creatinine, Ser 0.98; Hemoglobin 12.2; Platelets  196.0; Potassium 4.6; Sodium 142  Recent Lipid Panel    Component Value Date/Time   CHOL 236 (H) 08/08/2020 0935   TRIG 120 08/08/2020 0935   HDL 45 (L) 08/08/2020 0935   CHOLHDL 5.2 (H) 08/08/2020 0935   LDLCALC 166 (H) 08/08/2020 0935    Physical Exam:    VS:  BP (!) 185/89   Pulse 68   Ht '5\' 2"'$  (1.575 m)   Wt 240 lb 12.8 oz (109.2 kg)   SpO2 94%   BMI 44.04 kg/m     Wt Readings from Last 5 Encounters:  10/16/22 240 lb (108.9 kg)  09/25/22 240 lb 12.8 oz (109.2 kg)  06/11/22 244 lb (110.7 kg)  06/04/22 241 lb (109.3 kg)  05/27/22 244 lb 8 oz (110.9 kg)    Constitutional: No acute distress Eyes: sclera non-icteric, normal conjunctiva and lids ENMT: normal dentition, moist mucous membranes Cardiovascular: irregular rhythm, normal rate, no murmurs. S1 and S2 normal. No jugular venous distention.  Respiratory: clear to auscultation bilaterally GI : normal bowel sounds, soft and nontender. No distention.   MSK: extremities warm, well perfused. Mild bilateral edema.  NEURO: grossly nonfocal exam, moves all extremities. PSYCH: alert and oriented x 3, normal mood and affect.   ASSESSMENT:    1. Persistent atrial fibrillation ([place])   2. Primary hypertension   3. Anemia, unspecified type   4. Sleep apnea, unspecified type   5. Acute on chronic diastolic CHF (congestive heart failure) ([place])   6. Longstanding persistent atrial fibrillation ([place])   7. Secondary hypercoagulable state ([place])   8. Dyspnea on exertion      PLAN:    Atrial fibrillation, unspecified type ([place]) -The patient has been established with A. fib clinic and A. fib clinic has been recommendations about antiarrhythmic therapy.  I would defer to their judgment on selection of antiarrhythmics and I have recommended that the patient contact A. fib clinic to begin antiarrhythmic therapy or discuss ablation if indicated and coordinate cardioversion when appropriate.  She is interested in further  therapies. -Continue Eliquis 5 mg twice daily, no bleeding complications. - needs to treat OSA, awaiting cpap titration.   Dyspnea on exertion -  - suspect this may be related to afib and may also be multifactorial but does not seem related to ischemia.  She was unable to complete the cardiac monitor for more than 7 hours but was in atrial fibrillation for that duration.  She is interested in pursuing antiarrhythmic options and cardioversion for A. fib if she benefits with less dyspnea this would be quite helpful.  If dyspnea persists, would recommend a moderate exercise program to build up her conditioning and seeking out any further recommendations  from her primary care doctor. - may also be related to untreated severe OSA.   Hypercholesterolemia - continue atorvastatin 40 mg daily, needs repeat labs with PCP.   Primary hypertension -continue carvedilol and losartan.  Was previously on amlodipine 5 mg daily but discontinued due to lack of insurance coverage. BP not well controlled today. Risk factor for continued AF. Tangential patient conversation did not allow [place] to address this issue fully today. Refills provided.   Total time of encounter: 30 minutes total time of encounter, including 25 minutes spent in face-to-face patient care on the date of this encounter. This time includes coordination of care and counseling regarding above mentioned problem list. Remainder of non-face-to-face time involved reviewing chart documents/testing relevant to the patient encounter and documentation in the medical record. I have independently reviewed documentation from referring provider.   Cherlynn Kaiser, MD, [facility]    Medication Adjustments/Labs and Tests Ordered: Current medicines are reviewed at length with the patient today.  Concerns regarding medicines are outlined above.   Orders Placed This Encounter  Procedures   EKG 12-Lead   Meds ordered this encounter   Medications    apixaban (ELIQUIS) 5 MG TABS tablet    Sig: Take 1 tablet (5 mg total) by mouth 2 (two) times daily. Start taking tomorrow    Dispense:  60 tablet    Refill:  5    Please Discontinue the 90 day supply script that I just sent. Patient wants 30 day supply. Thanks, Mandi   atorvastatin (LIPITOR) 40 MG tablet    Sig: Take 1 tablet (40 mg total) by mouth daily.    Dispense:  90 tablet    Refill:  3    Needs appt   carvedilol (COREG) 12.5 MG tablet    Sig: Take 1 tablet (12.5 mg total) by mouth 2 (two) times daily with a meal.    Dispense:  180 tablet    Refill:  3   losartan (COZAAR) 100 MG tablet    Sig: Take 1 tablet (100 mg total) by mouth daily.    Dispense:  90 tablet    Refill:  3   Patient Instructions  Medication Instructions:  REFILLS SENT TO PHARMACY  *If you need a refill on your cardiac medications before your next appointment, please call your pharmacy*  Lab Work: None Ordered At This Time.  If you have labs (blood work) drawn today and your tests are completely normal, you will receive your results only by: [place] (if you have MyChart) OR A paper copy in the mail If you have any lab test that is abnormal or we need to change your treatment, we will call you to review the results.  Testing/Procedures: None Ordered At This Time.   Follow-Up: At [facility], you and your health needs are our priority.  As part of our continuing mission to provide you with exceptional heart care, we have created designated Provider Care Teams.  These Care Teams include your primary Cardiologist (physician) and Advanced Practice Providers (APPs -  Physician Assistants and Nurse Practitioners) who all work together to provide you with the care you need, when you need it.  Your next appointment:   6 MONTHS WITH APP   AND  1 year(s)  Provider:   DR. Margaretann Loveless  CALL AND ASK TO SPEAK TO [place] FOR SLEEP [phone]    I,Coren O'Brien,acting as a  scribe for Tara Munroe, MD.,have documented all relevant documentation  on the behalf of Tara Munroe, MD,as directed by  Tara Munroe, MD while in the presence of Tara Munroe, MD.  I, Tara Munroe, MD, have reviewed all documentation for the visit on 09/25/2022. The documentation on today's date of service for the exam, diagnosis, procedures, and orders are all accurate and complete.

## 2022-09-25 NOTE — Patient Instructions (Signed)
Medication Instructions:  REFILLS SENT TO PHARMACY  *If you need a refill on your cardiac medications before your next appointment, please call your pharmacy*  Lab Work: None Ordered At This Time.  If you have labs (blood work) drawn today and your tests are completely normal, you will receive your results only by: [place] (if you have MyChart) OR A paper copy in the mail If you have any lab test that is abnormal or we need to change your treatment, we will call you to review the results.  Testing/Procedures: None Ordered At This Time.   Follow-Up: At [facility], you and your health needs are our priority.  As part of our continuing mission to provide you with exceptional heart care, we have created designated Provider Care Teams.  These Care Teams include your primary Cardiologist (physician) and Advanced Practice Providers (APPs -  Physician Assistants and Nurse Practitioners) who all work together to provide you with the care you need, when you need it.  Your next appointment:   6 MONTHS WITH APP   AND  1 year(s)  Provider:   DR. Margaretann Loveless  CALL AND ASK TO SPEAK TO [place]

## 2022-09-29 ENCOUNTER — Encounter: Payer: Self-pay | Admitting: Cardiology

## 2022-09-30 NOTE — Telephone Encounter (Signed)
Encounter reviewed.   I am sorry for Tara Collier health problems.  Patient has been advised appropriately about postmenopausal bleeding.   We will be here if she wishes to have further evaluation.   Chart routed to nursing supervisor.

## 2022-09-30 NOTE — Telephone Encounter (Signed)
I spoke with patient's daughter Lavella Lemons.  She said her Mom is very frustrated because so much is wrong.  She said they have not forgotten about this and her mom understands it might mean endometrial cancer. She said she will talk with her about is some more.  Currently seeing cardiologist and scheduled for hospital stay to get her heart back in rhythm.  Seeing GI for hemoglobin being low looking for internal bleeding.  Has some severe dental problems they are getting dental care for.  Daughter is the one who takes care of her appointments and getting her to them and she is Pharmacist, hospital and full time grad student with children.  She said she is just overwhelmed but has not forgotten about this and will call for appt when they get to a place where they feel like they can schedule this.

## 2022-10-01 ENCOUNTER — Other Ambulatory Visit (HOSPITAL_COMMUNITY): Payer: Self-pay | Admitting: Internal Medicine

## 2022-10-01 ENCOUNTER — Other Ambulatory Visit: Payer: Self-pay | Admitting: Internal Medicine

## 2022-10-01 MED ORDER — APIXABAN 5 MG PO TABS: 5.0000 mg | ORAL_TABLET | Freq: Two times a day (BID) | ORAL | 5 refills | Status: DC

## 2022-10-01 NOTE — Telephone Encounter (Signed)
*  STAT* If patient is at the pharmacy, call can be transferred to refill team.   1. Which medications need to be refilled? (please list name of each medication and dose if known)  apixaban (ELIQUIS) 5 MG TABS tablet     2. Which pharmacy/location (including street and city if local pharmacy) is medication to be sent to? [id number] - [place], [place] - [address]   3. Do they need a 30 day or 90 day supply?  30 day supply  Patient states she is completely out of medication

## 2022-10-01 NOTE — Telephone Encounter (Signed)
Prescription refill request for Eliquis received. Indication: AF Last office visit: 09/25/22  Kelvin Cellar MD Scr: 0.98 on 05/27/22  Epic Age: 77 Weight: 109.2kg  Based on above findings Eliquis 5mg  twice daily is the appropriate dose.  Refill approved.

## 2022-10-02 ENCOUNTER — Other Ambulatory Visit: Payer: Self-pay | Admitting: Physician Assistant

## 2022-10-02 ENCOUNTER — Telehealth: Payer: Self-pay | Admitting: *Deleted

## 2022-10-02 NOTE — Telephone Encounter (Signed)
-----   Message from Tara Sine, MD sent at 06/21/2022  5:56 PM EDT ----- Tara Collier, please notify pt and initiate CPAP with DME

## 2022-10-02 NOTE — Telephone Encounter (Signed)
Message left on both home and cell number to return a call.

## 2022-10-06 NOTE — Telephone Encounter (Signed)
Letter mailed to patient 08/07/22 with recommendations.    Encounter closed.

## 2022-10-10 NOTE — Telephone Encounter (Signed)
-----   Message from Thomas A Kelly, MD sent at 06/21/2022  5:56 PM EDT ----- Nakiea Metzner, please notify pt and initiate CPAP with DME 

## 2022-10-10 NOTE — Telephone Encounter (Signed)
Message left to return a call.

## 2022-10-15 ENCOUNTER — Telehealth: Payer: Self-pay | Admitting: *Deleted

## 2022-10-15 NOTE — Telephone Encounter (Signed)
-----   Message from Thomas A Kelly, MD sent at 06/21/2022  5:56 PM EDT ----- Raylee Adamec, please notify pt and initiate CPAP with DME 

## 2022-10-15 NOTE — Telephone Encounter (Signed)
Message left to return a call to discuss sleep study results and recommendations.

## 2022-10-16 ENCOUNTER — Ambulatory Visit (INDEPENDENT_AMBULATORY_CARE_PROVIDER_SITE_OTHER): Payer: Medicare HMO

## 2022-10-16 VITALS — Wt 240.0 lb

## 2022-10-16 DIAGNOSIS — Z0000 Encounter for general adult medical examination without abnormal findings: Secondary | ICD-10-CM | POA: Diagnosis not present

## 2022-10-16 NOTE — Progress Notes (Signed)
I connected with  Tara Collier on 10/16/22 by a audio enabled telemedicine application and verified that I am speaking with the correct person using two identifiers along with daughter Tara Collier   Patient Location: Home  Provider Location: Office/Clinic  I discussed the limitations of evaluation and management by telemedicine. The patient expressed understanding and agreed to proceed.   Subjective:   Tara Collier is a 77 y.o. female who presents for Medicare Annual (Subsequent) preventive examination.  Review of Systems     Cardiac Risk Factors include: advanced age (>7mn, >>40women);hypertension;dyslipidemia;obesity (BMI >30kg/m2);sedentary lifestyle     Objective:    Today's Vitals   10/16/22 1606  Weight: 240 lb (108.9 kg)   Body mass index is 43.9 kg/m.     10/16/2022    4:18 PM 06/11/2022    8:29 PM 02/20/2022    9:57 AM 02/18/2022    3:27 PM  Advanced Directives  Does Patient Have a Medical Advance Directive? No No No No  Would patient like information on creating a medical advance directive? No - Patient declined Yes (MAU/Ambulatory/Procedural Areas - Information given) No - Patient declined No - Patient declined    Current Medications (verified) Outpatient Encounter Medications as of 10/16/2022  Medication Sig   acetaminophen (TYLENOL) 325 MG tablet Take 650 mg by mouth every 6 (six) hours as needed for moderate pain or headache.   acetaminophen (TYLENOL) 500 MG tablet Take 500 mg by mouth every 6 (six) hours as needed for mild pain or moderate pain.   apixaban (ELIQUIS) 5 MG TABS tablet Take 1 tablet (5 mg total) by mouth 2 (two) times daily.   Ascorbic Acid (VITAMIN C) 1000 MG tablet Take 1,000 mg by mouth daily.   atorvastatin (LIPITOR) 40 MG tablet Take 1 tablet (40 mg total) by mouth daily.   carvedilol (COREG) 12.5 MG tablet Take 1 tablet (12.5 mg total) by mouth 2 (two) times daily with a meal.   diclofenac Sodium (VOLTAREN) 1 % GEL APPLY 2 GRAMS TOPICALLY 4  (FOUR) TIMES DAILY AS NEEDED (KNEE PAIN).   ferrous sulfate 325 (65 FE) MG tablet Take 1 tablet (325 mg total) by mouth daily.   furosemide (LASIX) 20 MG tablet Take 1 tablet (20 mg total) by mouth daily. (Patient taking differently: Take 10 mg by mouth daily.)   gabapentin (NEURONTIN) 100 MG capsule TAKE 1 CAPSULE AT BEDTIME   losartan (COZAAR) 100 MG tablet Take 1 tablet (100 mg total) by mouth daily.   meclizine (ANTIVERT) 25 MG tablet Take 1 tablet (25 mg total) by mouth 3 (three) times daily as needed for dizziness.   MYRBETRIQ 50 MG TB24 tablet Take 50 mg by mouth daily.   polyethylene glycol powder (GLYCOLAX/MIRALAX) 17 GM/SCOOP powder Take 17 g by mouth daily as needed for mild constipation.   senna (SENOKOT) 8.6 MG TABS tablet Take 2 tablets by mouth at bedtime as needed for mild constipation.   No facility-administered encounter medications on file as of 10/16/2022.    Allergies (verified) Tape, Bee venom, and Norvasc [amlodipine]   History: Past Medical History:  Diagnosis Date   Abnormal uterine bleeding    Arthritis    Atrial fibrillation ([place] 06/03/2013   Last Assessment & Plan:  Formatting of this note might be different from the original. Continue propafenone for rhythm control .Patient has clear symptomatic benefit with sinus rhythm maintenance. CHADS-Vasc score over 2, continue anticoagulation with eliquis.   HTN (hypertension) 06/03/2013   Last Assessment & Plan:  Formatting of this note might be different from the original. Hypertension is well controlled.  Continue current medical regimen and low sodium diet.  Reevaluate at next office visit   Hypercholesterolemia    Restless leg syndrome    Urinary incontinence 08/08/2020   Past Surgical History:  Procedure Laterality Date   CARDIOVERSION     COLONOSCOPY WITH PROPOFOL N/A 02/20/2022   Procedure: COLONOSCOPY WITH PROPOFOL;  Surgeon: Irene Shipper, MD;  Location: [place];  Service: Gastroenterology;   Laterality: N/A;   ESOPHAGOGASTRODUODENOSCOPY (EGD) WITH PROPOFOL N/A 02/20/2022   Procedure: ESOPHAGOGASTRODUODENOSCOPY (EGD) WITH PROPOFOL;  Surgeon: Irene Shipper, MD;  Location: [place];  Service: Gastroenterology;  Laterality: N/A;   HOT HEMOSTASIS N/A 02/20/2022   Procedure: HOT HEMOSTASIS (ARGON PLASMA COAGULATION/BICAP);  Surgeon: Irene Shipper, MD;  Location: [place];  Service: Gastroenterology;  Laterality: N/A;   RADIOFREQUENCY ABLATION     ROTATOR CUFF REPAIR     TUBAL LIGATION     Family History  Problem Relation Age of Onset   Heart disease Mother    Appendicitis Mother    Appendicitis Father    Heart disease Father    Heart disease Maternal Grandmother    Social History   Socioeconomic History   Marital status: Single    Spouse name: Not on file   Number of children: Not on file   Years of education: Not on file   Highest education level: Not on file  Occupational History   Not on file  Tobacco Use   Smoking status: Never   Smokeless tobacco: Never  Vaping Use   Vaping Use: Never used  Substance and Sexual Activity   Alcohol use: No   Drug use: No   Sexual activity: Not Currently    Birth control/protection: Surgical    Comment: tubal  Other Topics Concern   Not on file  Social History Narrative   Daughter in [place]   Lives by herself   Worked for [employer] for 20+ years   Social Determinants of Health   Financial Resource Strain: Low Risk  (10/16/2022)   Overall Financial Resource Strain (CARDIA)    Difficulty of Paying Living Expenses: Not hard at all  Food Insecurity: No Food Insecurity (10/16/2022)   Hunger Vital Sign    Worried About Running Out of Food in the Last Year: Never true    [place] in the Last Year: Never true  Transportation Needs: No Transportation Needs (10/16/2022)   PRAPARE - Transportation    Lack of Transportation (Medical): No    Lack of Transportation (Non-Medical): No  Physical Activity: Inactive (10/16/2022)    Exercise Vital Sign    Days of Exercise per Week: 0 days    Minutes of Exercise per Session: 0 min  Stress: No Stress Concern Present (10/16/2022)   [place]    Feeling of Stress : Not at all  Social Connections: Socially Isolated (10/16/2022)   Social Connection and Isolation Panel [NHANES]    Frequency of Communication with Friends and Family: More than three times a week    Frequency of Social Gatherings with Friends and Family: Never    Attends Religious Services: Never    Marine scientist or Organizations: No    Attends Archivist Meetings: Never    Marital Status: Never married    Tobacco Counseling Counseling given: Not Answered   Clinical Intake:  Pre-visit preparation completed: Yes  Pain : No/denies pain     BMI - recorded: 43.9 Nutritional Status: BMI > 30  Obese Nutritional Risks: None Diabetes: No  How often do you need to have someone help you when you read instructions, pamphlets, or other written materials from your doctor or pharmacy?: 1 - Never  Diabetic?no  Interpreter Needed?: No  Information entered by :: Charlott Rakes, LPN   Activities of Daily Living    10/16/2022    4:20 PM  In your present state of health, do you have any difficulty performing the following activities:  Hearing? 0  Vision? 0  Difficulty concentrating or making decisions? 0  Walking or climbing stairs? 1  Comment need a rail or assistance  Dressing or bathing? 1  Comment getting on bra and sock  Doing errands, shopping? 0  Preparing Food and eating ? N  Using the Toilet? N  In the past six months, have you accidently leaked urine? Y  Comment wears a brief  Do you have problems with loss of bowel control? N  Managing your Medications? N  Managing your Finances? N  Housekeeping or managing your Housekeeping? N    Patient Care Team: Inda Coke, [place] as PCP - General (Physician  Assistant) Elouise Munroe, MD as PCP - Cardiology (Cardiology) Nunzio Cobbs, MD as Consulting Physician (Obstetrics and Gynecology)  Indicate any recent Medical Services you may have received from other than [facility] providers in the past year (date may be approximate).     Assessment:   This is a routine wellness examination for [facility].  Hearing/Vision screen Hearing Screening - Comments:: Pt denies any hearing issues  Vision Screening - Comments:: Pt follows up with Dr Idolina Primer for annual eye exams   Dietary issues and exercise activities discussed: Current Exercise Habits: The patient does not participate in regular exercise at present   Goals Addressed             This Visit's Progress    Patient Stated       Getting a dentist and get motivated        Depression Screen    10/16/2022    4:13 PM 05/27/2022    3:14 PM 08/08/2020    8:34 AM  PHQ 2/9 Scores  PHQ - 2 Score 0 0 0    Fall Risk    10/16/2022    4:19 PM  Fall Risk   Falls in the past year? 0  Number falls in past yr: 0  Injury with Fall? 0  Risk for fall due to : Impaired vision;Impaired balance/gait;Impaired mobility  Follow up Falls prevention discussed    FALL RISK PREVENTION PERTAINING TO THE HOME:  Any stairs in or around the home? Yes  If so, are there any without handrails? No  Home free of loose throw rugs in walkways, pet beds, electrical cords, etc? Yes  Adequate lighting in your home to reduce risk of falls? Yes   ASSISTIVE DEVICES UTILIZED TO PREVENT FALLS:  Life alert? No  Use of a cane, walker or w/c? Yes  Grab bars in the bathroom? Yes  Shower chair or bench in shower? Yes  Elevated toilet seat or a handicapped toilet? No   TIMED UP AND GO:  Was the test performed? No .  Cognitive Function:        10/16/2022    4:26 PM  6CIT Screen  What Year? 0 points  What month? 0 points  What time? 0 points  Count back from 20 0 points  Months in reverse 4 points  Repeat  phrase 0 points  Total Score 4 points    Immunizations Immunization History  Administered Date(s) Administered   Fluad Quad(high Dose 65+) 05/27/2022   Influenza, High Dose Seasonal PF 06/06/2020   Moderna Sars-Covid-2 Vaccination 12/19/2019, 01/16/2020, 07/25/2020   PNEUMOCOCCAL CONJUGATE-20 05/27/2022   Pneumococcal Conjugate-13 10/06/2017    TDAP status: Due, Education has been provided regarding the importance of this vaccine. Advised may receive this vaccine at local pharmacy or Health Dept. Aware to provide a copy of the vaccination record if obtained from local pharmacy or Health Dept. Verbalized acceptance and understanding.  Flu Vaccine status: Up to date  Pneumococcal vaccine status: Up to date  Covid-19 vaccine status: Completed vaccines  Qualifies for Shingles Vaccine? Yes   Zostavax completed No   Shingrix Completed?: No.    Education has been provided regarding the importance of this vaccine. Patient has been advised to call insurance company to determine out of pocket expense if they have not yet received this vaccine. Advised may also receive vaccine at local pharmacy or Health Dept. Verbalized acceptance and understanding.  Screening Tests Health Maintenance  Topic Date Due   Hepatitis C Screening  Never done   DTaP/Tdap/Td (1 - Tdap) Never done   DEXA SCAN  Never done   COVID-19 Vaccine (4 - 2023-24 season) 04/25/2022   Zoster Vaccines- Shingrix (1 of 2) 05/28/2023 (Originally 06/25/1997)   Medicare Annual Wellness ([place])  10/17/2023   COLONOSCOPY (Pts 45-17yr Insurance coverage will need to be confirmed)  02/21/2032   Pneumonia Vaccine 76 Years old  Completed   INFLUENZA VACCINE  Completed   HPV VACCINES  Aged Out    Health Maintenance  Health Maintenance Due  Topic Date Due   Hepatitis C Screening  Never done   DTaP/Tdap/Td (1 - Tdap) Never done   DEXA SCAN  Never done   COVID-19 Vaccine (4 - 2023-24 season) 04/25/2022    Colorectal cancer  screening: Type of screening: Colonoscopy. Completed 02/20/22. Repeat every 10 years  Mammogram not at this time     Additional Screening:  Hepatitis C Screening: does qualify;   Vision Screening: Recommended annual ophthalmology exams for early detection of glaucoma and other disorders of the eye. Is the patient up to date with their annual eye exam?  Yes  Who is the provider or what is the name of the office in which the patient attends annual eye exams? Dr DIdolina PrimerIf pt is not established with a provider, would they like to be referred to a provider to establish care? No .   Dental Screening: Recommended annual dental exams for proper oral hygiene  Community Resource Referral / Chronic Care Management: CRR required this visit?  No   CCM required this visit?  No      Plan:     I have personally reviewed and noted the following in the patient's chart:   Medical and social history Use of alcohol, tobacco or illicit drugs  Current medications and supplements including opioid prescriptions. Patient is not currently taking opioid prescriptions. Functional ability and status Nutritional status Physical activity Advanced directives List of other physicians Hospitalizations, surgeries, and ER visits in previous 12 months Vitals Screenings to include cognitive, depression, and falls Referrals and appointments  In addition, I have reviewed and discussed with patient certain preventive protocols, quality metrics, and best practice recommendations. A written personalized care plan for preventive services as well  as general preventive health recommendations were provided to patient.     Willette Brace, LPN   [id number]   Nurse Notes: pt is requesting a referral for dentistry

## 2022-10-16 NOTE — Patient Instructions (Signed)
Tara Collier , Thank you for taking time to come for your Medicare Wellness Visit. I appreciate your ongoing commitment to your health goals. Please review the following plan we discussed and let me know if I can assist you in the future.   These are the goals we discussed:  Goals      Patient Stated     Getting a dentist and get motivated         This is a list of the screening recommended for you and due dates:  Health Maintenance  Topic Date Due   Hepatitis C Screening: USPSTF Recommendation to screen - Ages 76-79 yo.  Never done   DTaP/Tdap/Td vaccine (1 - Tdap) Never done   DEXA scan (bone density measurement)  Never done   COVID-19 Vaccine (4 - 2023-24 season) 04/25/2022   Zoster (Shingles) Vaccine (1 of 2) 05/28/2023*   Medicare Annual Wellness Visit  10/17/2023   Colon Cancer Screening  02/21/2032   Pneumonia Vaccine  Completed   Flu Shot  Completed   HPV Vaccine  Aged Out  *Topic was postponed. The date shown is not the original due date.    Advanced directives: Advance directive discussed with you today. Even though you declined this today please call our office should you change your mind and we can give you the proper paperwork for you to fill out.   Conditions/risks identified: getting a dentist and getting motivated   Next appointment: Follow up in one year for your annual wellness visit    Preventive Care 65 Years and Older, Female Preventive care refers to lifestyle choices and visits with your health care provider that can promote health and wellness. What does preventive care include? A yearly physical exam. This is also called an annual well check. Dental exams once or twice a year. Routine eye exams. Ask your health care provider how often you should have your eyes checked. Personal lifestyle choices, including: Daily care of your teeth and gums. Regular physical activity. Eating a healthy diet. Avoiding tobacco and drug use. Limiting alcohol  use. Practicing safe sex. Taking low-dose aspirin every day. Taking vitamin and mineral supplements as recommended by your health care provider. What happens during an annual well check? The services and screenings done by your health care provider during your annual well check will depend on your age, overall health, lifestyle risk factors, and family history of disease. Counseling  Your health care provider may ask you questions about your: Alcohol use. Tobacco use. Drug use. Emotional well-being. Home and relationship well-being. Sexual activity. Eating habits. History of falls. Memory and ability to understand (cognition). Work and work Statistician. Reproductive health. Screening  You may have the following tests or measurements: Height, weight, and BMI. Blood pressure. Lipid and cholesterol levels. These may be checked every 5 years, or more frequently if you are over 7 years old. Skin check. Lung cancer screening. You may have this screening every year starting at age 37 if you have a 30-pack-year history of smoking and currently smoke or have quit within the past 15 years. Fecal occult blood test (FOBT) of the stool. You may have this test every year starting at age 9. Flexible sigmoidoscopy or colonoscopy. You may have a sigmoidoscopy every 5 years or a colonoscopy every 10 years starting at age 14. Hepatitis C blood test. Hepatitis B blood test. Sexually transmitted disease (STD) testing. Diabetes screening. This is done by checking your blood sugar (glucose) after you have not eaten for  a while (fasting). You may have this done every 1-3 years. Bone density scan. This is done to screen for osteoporosis. You may have this done starting at age 55. Mammogram. This may be done every 1-2 years. Talk to your health care provider about how often you should have regular mammograms. Talk with your health care provider about your test results, treatment options, and if necessary,  the need for more tests. Vaccines  Your health care provider may recommend certain vaccines, such as: Influenza vaccine. This is recommended every year. Tetanus, diphtheria, and acellular pertussis (Tdap, Td) vaccine. You may need a Td booster every 10 years. Zoster vaccine. You may need this after age 103. Pneumococcal 13-valent conjugate (PCV13) vaccine. One dose is recommended after age 67. Pneumococcal polysaccharide (PPSV23) vaccine. One dose is recommended after age 83. Talk to your health care provider about which screenings and vaccines you need and how often you need them. This information is not intended to replace advice given to you by your health care provider. Make sure you discuss any questions you have with your health care provider. Document Released: 09/07/2015 Document Revised: 04/30/2016 Document Reviewed: 06/12/2015 Elsevier Interactive Patient Education  2017 [place] Prevention in the Home Falls can cause injuries. They can happen to people of all ages. There are many things you can do to make your home safe and to help prevent falls. What can I do on the outside of my home? Regularly fix the edges of walkways and driveways and fix any cracks. Remove anything that might make you trip as you walk through a door, such as a raised step or threshold. Trim any bushes or trees on the path to your home. Use bright outdoor lighting. Clear any walking paths of anything that might make someone trip, such as rocks or tools. Regularly check to see if handrails are loose or broken. Make sure that both sides of any steps have handrails. Any raised decks and porches should have guardrails on the edges. Have any leaves, snow, or ice cleared regularly. Use sand or salt on walking paths during winter. Clean up any spills in your garage right away. This includes oil or grease spills. What can I do in the bathroom? Use night lights. Install grab bars by the toilet and in the  tub and shower. Do not use towel bars as grab bars. Use non-skid mats or decals in the tub or shower. If you need to sit down in the shower, use a plastic, non-slip stool. Keep the floor dry. Clean up any water that spills on the floor as soon as it happens. Remove soap buildup in the tub or shower regularly. Attach bath mats securely with double-sided non-slip rug tape. Do not have throw rugs and other things on the floor that can make you trip. What can I do in the bedroom? Use night lights. Make sure that you have a light by your bed that is easy to reach. Do not use any sheets or blankets that are too big for your bed. They should not hang down onto the floor. Have a firm chair that has side arms. You can use this for support while you get dressed. Do not have throw rugs and other things on the floor that can make you trip. What can I do in the kitchen? Clean up any spills right away. Avoid walking on wet floors. Keep items that you use a lot in easy-to-reach places. If you need to reach something above  you, use a strong step stool that has a grab bar. Keep electrical cords out of the way. Do not use floor polish or wax that makes floors slippery. If you must use wax, use non-skid floor wax. Do not have throw rugs and other things on the floor that can make you trip. What can I do with my stairs? Do not leave any items on the stairs. Make sure that there are handrails on both sides of the stairs and use them. Fix handrails that are broken or loose. Make sure that handrails are as long as the stairways. Check any carpeting to make sure that it is firmly attached to the stairs. Fix any carpet that is loose or worn. Avoid having throw rugs at the top or bottom of the stairs. If you do have throw rugs, attach them to the floor with carpet tape. Make sure that you have a light switch at the top of the stairs and the bottom of the stairs. If you do not have them, ask someone to add them for  you. What else can I do to help prevent falls? Wear shoes that: Do not have high heels. Have rubber bottoms. Are comfortable and fit you well. Are closed at the toe. Do not wear sandals. If you use a stepladder: Make sure that it is fully opened. Do not climb a closed stepladder. Make sure that both sides of the stepladder are locked into place. Ask someone to hold it for you, if possible. Clearly mark and make sure that you can see: Any grab bars or handrails. First and last steps. Where the edge of each step is. Use tools that help you move around (mobility aids) if they are needed. These include: Canes. Walkers. Scooters. Crutches. Turn on the lights when you go into a dark area. Replace any light bulbs as soon as they burn out. Set up your furniture so you have a clear path. Avoid moving your furniture around. If any of your floors are uneven, fix them. If there are any pets around you, be aware of where they are. Review your medicines with your doctor. Some medicines can make you feel dizzy. This can increase your chance of falling. Ask your doctor what other things that you can do to help prevent falls. This information is not intended to replace advice given to you by your health care provider. Make sure you discuss any questions you have with your health care provider. Document Released: 06/07/2009 Document Revised: 01/17/2016 Document Reviewed: 09/15/2014 Elsevier Interactive Patient Education  2017 [employer].

## 2022-10-16 NOTE — Telephone Encounter (Signed)
Pt's daughter is returning call and is requesting a return call.

## 2022-10-23 NOTE — Telephone Encounter (Signed)
Message left to return a call to me.

## 2022-10-29 IMAGING — DX DG CHEST 2V
2 series · 2 of 2 positions shown · non-contrast
Comparison: None.

CLINICAL DATA: Peripheral edema, shortness of breath, hypertension,
atrial fibrillation

EXAM:
CHEST - 2 VIEW

[chest pa]
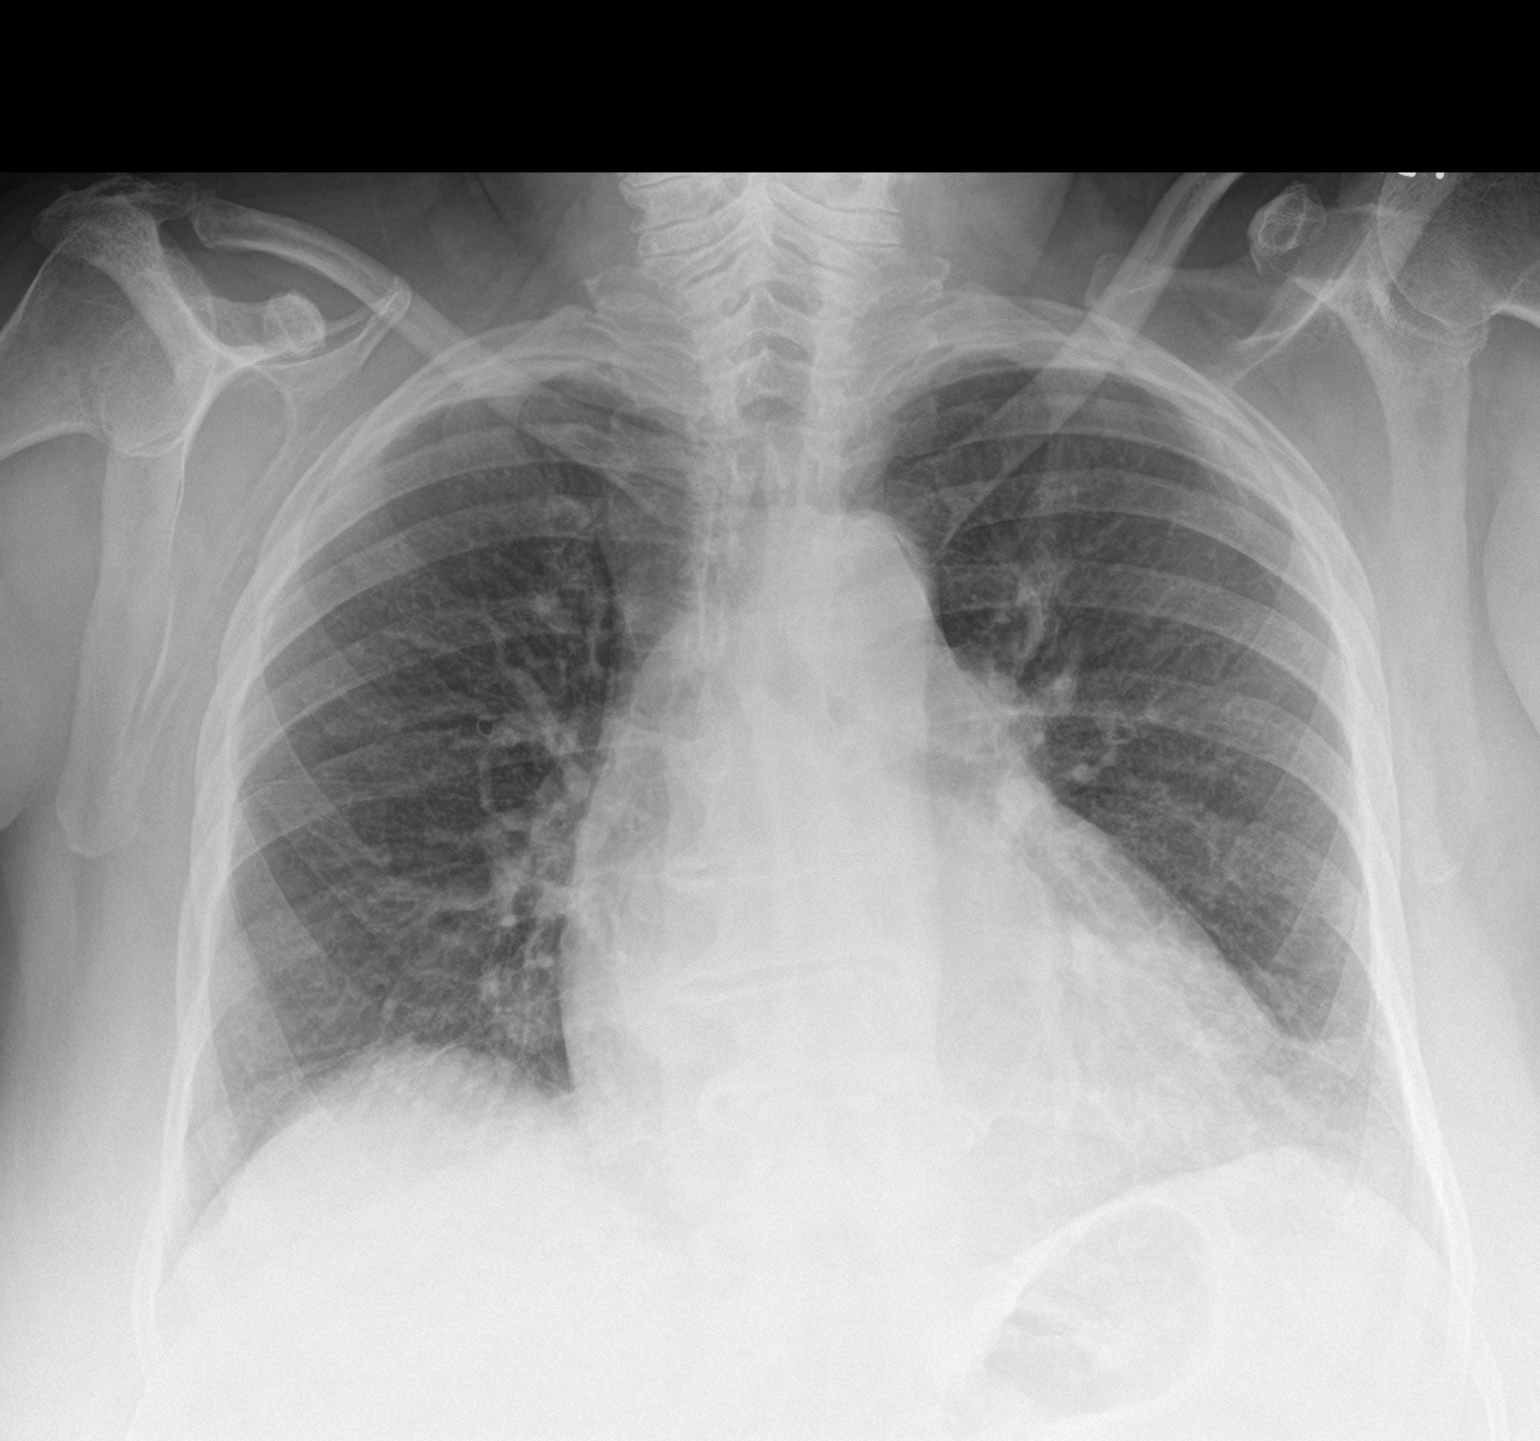

[chest lat]
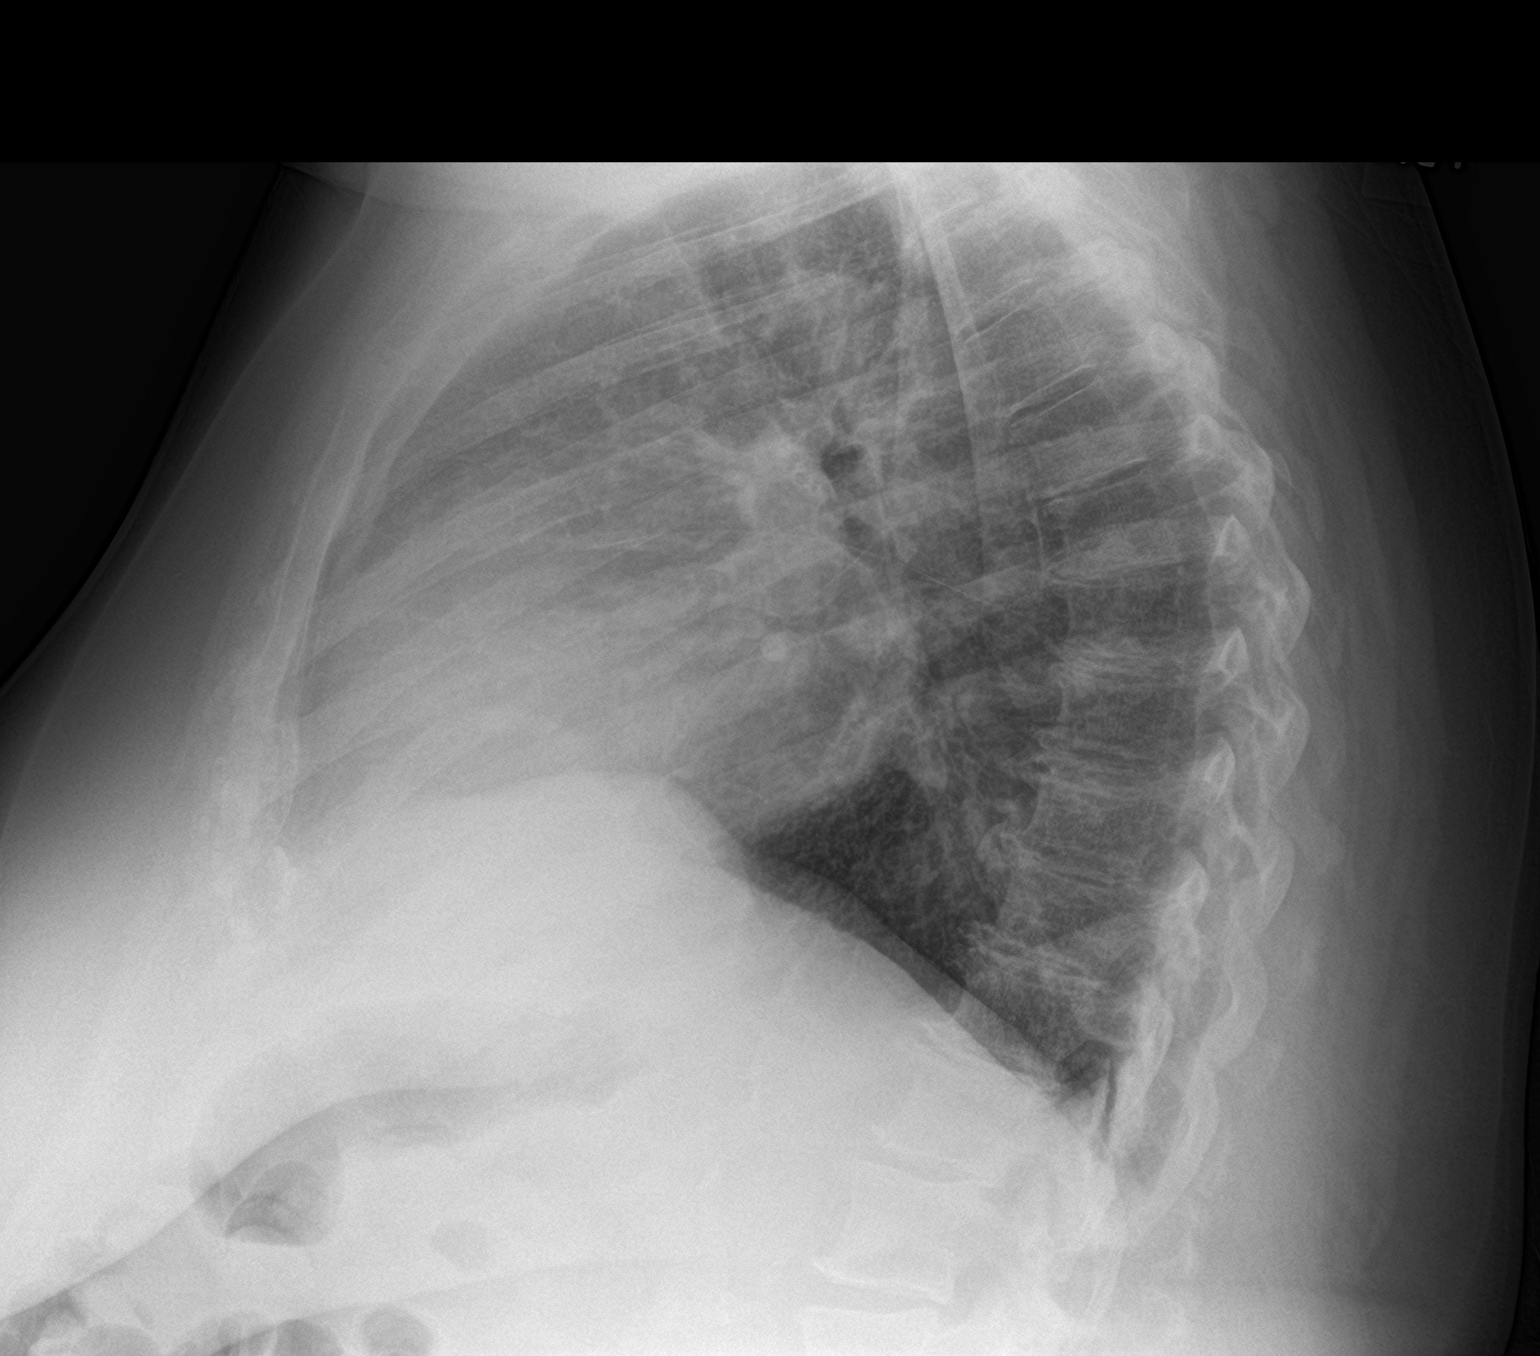

[2 of 2 positions shown; findings below may reference images not displayed]

FINDINGS: Moderate cardiomegaly with slight central vascular prominence.
Negative for CHF or definite edema. Minor basilar atelectasis. No
effusion or pneumothorax. No focal pneumonia. Trachea midline. Aorta
atherosclerotic and degenerative changes noted of the spine.
IMPRESSION: Cardiomegaly without acute process.

## 2022-11-03 ENCOUNTER — Other Ambulatory Visit: Payer: Self-pay | Admitting: *Deleted

## 2022-11-03 MED ORDER — APIXABAN 5 MG PO TABS: 5.0000 mg | ORAL_TABLET | Freq: Two times a day (BID) | ORAL | 1 refills | Status: DC

## 2022-11-03 NOTE — Telephone Encounter (Signed)
Prescription refill request for Eliquis received.  Indication: afib  Last office visit: achrya 09/25/2022 Scr: 0.98, 05/27/2022 Age: 77 yo  Weight: 108.9 kg   Refill sent.

## 2023-03-05 ENCOUNTER — Encounter: Payer: Self-pay | Admitting: Family Medicine

## 2023-03-05 ENCOUNTER — Ambulatory Visit (INDEPENDENT_AMBULATORY_CARE_PROVIDER_SITE_OTHER): Payer: Medicare HMO | Admitting: Family Medicine

## 2023-03-05 VITALS — BP 170/86 | HR 61 | Temp 98.5°F | Ht 62.0 in | Wt 239.1 lb

## 2023-03-05 DIAGNOSIS — Z1331 Encounter for screening for depression: Secondary | ICD-10-CM

## 2023-03-05 DIAGNOSIS — L255 Unspecified contact dermatitis due to plants, except food: Secondary | ICD-10-CM

## 2023-03-05 DIAGNOSIS — I10 Essential (primary) hypertension: Secondary | ICD-10-CM

## 2023-03-05 MED ORDER — BETAMETHASONE DIPROPIONATE 0.05 % EX CREA: TOPICAL_CREAM | Freq: Two times a day (BID) | CUTANEOUS | 0 refills | Status: AC

## 2023-03-05 NOTE — Progress Notes (Signed)
Subjective  CC:  Chief Complaint  Patient presents with   Poison Ivy    Pt stated that she has a rash on her rt arm and thinks it is poison Tara Collier that she got when she was pulling weeds in her yard   Same day acute visit; PCP not available. New pt to me. Chart reviewed.   HPI: Tara Collier is a 77 y.o. female who presents to the office today to address the problems listed above in the chief complaint. 77 year old with history of hypertension, heart failure on Eliquis due to chronic A-fib presents due to rash x 3 days.  She was pulling weeds in her yard.  Has had poison ivy before, same presentation.  Very itchy.  Right forearm and right inguinal region affected.  No systemic symptoms. Assessment  1. Rhus dermatitis   2. Primary hypertension   3. Positive screening for depression on 9-item Patient Health Questionnaire (PHQ-9)      Plan  Poison ivy: Patient with high blood pressure response today in part likely due to itching and discomfort.  Because of that and Eliquis would prefer to not use oral prednisone.  We discussed options of treatment.  Elect betamethasone twice daily for the next week to 2 weeks.  She will use topical Benadryl for itching.  Oral Benadryl at night if needed.  Reassured.  She will contact me if things are not improving. PHQ was positive, in part done with her daughter.  However, patient reports she is happy, very playful and laughing today.  Mood possibly fluctuates.  Recommend follow-up with PCP.  Follow up: Follow-up PCP to follow-up on blood pressure. Visit date not found  No orders of the defined types were placed in this encounter.  No orders of the defined types were placed in this encounter.     I reviewed the patients updated PMH, FH, and SocHx.    Patient Active Problem List   Diagnosis Date Noted   Sleep apnea 04/30/2022   Snoring 04/30/2022   Class 3 severe obesity due to excess calories without serious comorbidity with body mass index (BMI) of  40.0 to 44.9 in adult (HCC) 04/30/2022   Sleep walkings 04/30/2022   SOB (shortness of breath) on exertion 04/30/2022   Nocturia 04/30/2022   Paroxysmal atrial fibrillation (HCC) 04/30/2022   Weakness 02/20/2022   Angiodysplasia of duodenum with hemorrhage    Symptomatic anemia 02/18/2022   Uterine bleeding 02/18/2022   Acute on chronic diastolic CHF (congestive heart failure) (HCC) 02/18/2022   Secondary hypercoagulable state (HCC) 10/15/2020   Urinary incontinence 08/08/2020   Arthritis    Hypercholesterolemia    Restless leg syndrome    Atrial fibrillation (HCC) 06/03/2013   HTN (hypertension) 06/03/2013   Current Meds  Medication Sig   acetaminophen (TYLENOL) 325 MG tablet Take 650 mg by mouth every 6 (six) hours as needed for moderate pain or headache.   acetaminophen (TYLENOL) 500 MG tablet Take 500 mg by mouth every 6 (six) hours as needed for mild pain or moderate pain.   apixaban (ELIQUIS) 5 MG TABS tablet Take 1 tablet (5 mg total) by mouth 2 (two) times daily.   Ascorbic Acid (VITAMIN C) 1000 MG tablet Take 1,000 mg by mouth daily.   atorvastatin (LIPITOR) 40 MG tablet Take 1 tablet (40 mg total) by mouth daily.   carvedilol (COREG) 12.5 MG tablet Take 1 tablet (12.5 mg total) by mouth 2 (two) times daily with a meal.   diclofenac Sodium (VOLTAREN)  1 % GEL APPLY 2 GRAMS TOPICALLY 4 (FOUR) TIMES DAILY AS NEEDED (KNEE PAIN).   furosemide (LASIX) 20 MG tablet Take 1 tablet (20 mg total) by mouth daily. (Patient taking differently: Take 10 mg by mouth daily.)   gabapentin (NEURONTIN) 100 MG capsule TAKE 1 CAPSULE AT BEDTIME   losartan (COZAAR) 100 MG tablet Take 1 tablet (100 mg total) by mouth daily.   meclizine (ANTIVERT) 25 MG tablet Take 1 tablet (25 mg total) by mouth 3 (three) times daily as needed for dizziness.   MYRBETRIQ 50 MG TB24 tablet Take 50 mg by mouth daily.   polyethylene glycol powder (GLYCOLAX/MIRALAX) 17 GM/SCOOP powder Take 17 g by mouth daily as needed  for mild constipation.   senna (SENOKOT) 8.6 MG TABS tablet Take 2 tablets by mouth at bedtime as needed for mild constipation.    Allergies: Patient is allergic to tape, bee venom, and norvasc [amlodipine]. Family History: Patient family history includes Appendicitis in her father and mother; Heart disease in her father, maternal grandmother, and mother. Social History:  Patient  reports that she has never smoked. She has never used smokeless tobacco. She reports that she does not drink alcohol and does not use drugs.  Review of Systems: Constitutional: Negative for fever malaise or anorexia Cardiovascular: negative for chest pain Respiratory: negative for SOB or persistent cough Gastrointestinal: negative for abdominal pain  Objective  Vitals: BP (!) 170/86   Pulse 61   Temp 98.5 F (36.9 C)   Ht 5\' 2"  (1.575 m)   Wt 239 lb 1.6 oz (108.5 kg)   SpO2 95%   BMI 43.73 kg/m  General: no acute distress , A&Ox3  Skin: Right forearm with linear vesicular rash, mild Right inguinal region with large macular rash with some vesicles noted  Commons side effects, risks, benefits, and alternatives for medications and treatment plan prescribed today were discussed, and the patient expressed understanding of the given instructions. Patient is instructed to call or message via MyChart if he/she has any questions or concerns regarding our treatment plan. No barriers to understanding were identified. We discussed Red Flag symptoms and signs in detail. Patient expressed understanding regarding what to do in case of urgent or emergency type symptoms.  Medication list was reconciled, printed and provided to the patient in AVS. Patient instructions and summary information was reviewed with the patient as documented in the AVS. This note was prepared with assistance of Dragon voice recognition software. Occasional wrong-word or sound-a-like substitutions may have occurred due to the inherent limitations  of voice recognition software

## 2023-03-05 NOTE — Patient Instructions (Signed)
Please return to see Tara Collier to recheck your blood pressure   If you have any questions or concerns, please don't hesitate to send me a message via MyChart or call the office at [phone]. Thank you for visiting with [place] today! It's our pleasure caring for you.   Poison Ivy Dermatitis Poison ivy dermatitis is irritation and swelling (inflammation) of the skin caused by chemicals in the leaves of the poison ivy plant. The skin reaction often involves redness, blisters, and extreme itching. What are the causes? This condition is caused by a chemical (urushiol) found in the sap of the poison ivy plant. This chemical is sticky and can easily spread to people, animals, and objects. You can get poison ivy dermatitis by: Having direct contact with a poison ivy plant. Touching animals, other people, or objects that have come in contact with poison ivy and have the chemical on them. What increases the risk? This condition is more likely to develop in people who: Are outdoors often in wooded or [place] areas. Go outdoors without wearing protective clothing, such as closed shoes, long pants, and a long-sleeved shirt. What are the signs or symptoms? Symptoms of this condition include: Redness of the skin. Extreme itching. A rash that often includes bumps and blisters. The rash usually appears 48 hours after exposure, if you have been exposed before. If this is the first time you have been exposed, the rash may not appear until a week after exposure. Swelling. This may occur if the reaction is more severe. Symptoms usually last for 1-2 weeks. However, the first time you develop this condition, symptoms may last 3-4 weeks. How is this diagnosed? This condition may be diagnosed based on your symptoms and a physical exam. Your health care provider may also ask you about any recent outdoor activity. How is this treated? Treatment for this condition will vary depending on how severe it is. Treatment may  include: Hydrocortisone cream or calamine lotion to relieve itching. Oatmeal baths to soothe the skin. Medicines, such as over-the-counter antihistamine tablets. Oral or injected steroid medicine, for more severe reactions. Follow these instructions at home: Medicines Take or apply over-the-counter and prescription medicines only as told by your health care provider. Use hydrocortisone cream or calamine lotion as needed to soothe the skin and relieve itching. General instructions Do not scratch or rub your skin. Apply a cold, wet cloth (cold compress) to the affected areas or take baths in cool water. This will help with itching. Avoid hot baths and showers. Take oatmeal baths as needed. Use colloidal oatmeal. You can get this at your local pharmacy or grocery store. Follow the instructions on the packaging. Wash all clothes, bedsheets, towels, and blankets you were in contact with between your exposure and appearance of the rash. Check the affected area every day for signs of infection. Check for: More redness, swelling, or pain. Fluid or blood. Warmth. Pus or a bad smell. Keep all follow-up visits. Your health care provider may want to see how your skin is progressing with treatment. How is this prevented?  Learn to identify the poison ivy plant and avoid contact with the plant. This plant can be recognized by the number of leaves. Generally, poison ivy has three leaves with flowering branches on a single stem. The leaves are typically glossy, and they have jagged edges that come to a point. If you have been exposed to poison ivy, thoroughly wash with soap and water right away. You have about 30 minutes to remove  the plant resin before it will cause the rash. Be sure to wash under your fingernails, because any plant resin there will continue to spread the rash. When hiking or camping, wear clothes that will help you to avoid skin exposure. This includes long pants, a long-sleeved shirt,  long socks, and hiking boots. You can also apply preventive lotion to your skin to help limit exposure. If you suspect that your clothes or outdoor gear came in contact with poison ivy, rinse them off outside with a garden hose before you bring them inside your house. When doing yard work or gardening, wear gloves, long sleeves, long pants, and boots. Wash your garden tools and gloves if they come in contact with poison ivy. If you suspect that your pet has come into contact with poison ivy, wash them with pet shampoo and water. Make sure to wear gloves while washing your pet. Contact a health care provider if: You have open sores in the rash area. You have any signs of infection. You have redness that spreads beyond the rash area. You have a fever. You have a rash over a large area of your body. You have a rash on your eyes, mouth, or genitals. You have a rash that does not improve after a few weeks. Get help right away if: Your face swells or your eyes swell shut. You have trouble breathing. You have trouble swallowing. These symptoms may be an emergency. Get help right away. Call 911. Do not wait to see if the symptoms will go away. Do not drive yourself to the hospital. This information is not intended to replace advice given to you by your health care provider. Make sure you discuss any questions you have with your health care provider. Document Revised: 01/09/2022 Document Reviewed: 01/09/2022 Elsevier Patient Education  2024 [employer].

## 2023-03-16 ENCOUNTER — Ambulatory Visit (INDEPENDENT_AMBULATORY_CARE_PROVIDER_SITE_OTHER): Payer: Medicare HMO | Admitting: Physician Assistant

## 2023-03-16 ENCOUNTER — Encounter: Payer: Self-pay | Admitting: Physician Assistant

## 2023-03-16 VITALS — BP 176/100 | HR 69 | Temp 97.5°F | Ht 62.0 in | Wt 236.2 lb

## 2023-03-16 DIAGNOSIS — K5900 Constipation, unspecified: Secondary | ICD-10-CM | POA: Diagnosis not present

## 2023-03-16 DIAGNOSIS — I10 Essential (primary) hypertension: Secondary | ICD-10-CM | POA: Diagnosis not present

## 2023-03-16 DIAGNOSIS — I4821 Permanent atrial fibrillation: Secondary | ICD-10-CM

## 2023-03-16 DIAGNOSIS — G4733 Obstructive sleep apnea (adult) (pediatric): Secondary | ICD-10-CM

## 2023-03-16 LAB — COMPREHENSIVE METABOLIC PANEL
ALT: 17 U/L (ref 0–35)
AST: 19 U/L (ref 0–37)
Albumin: 4.1 g/dL (ref 3.5–5.2)
Alkaline Phosphatase: 90 U/L (ref 39–117)
BUN: 18 mg/dL (ref 6–23)
CO2: 25 mEq/L (ref 19–32)
Calcium: 9.2 mg/dL (ref 8.4–10.5)
Chloride: 107 mEq/L (ref 96–112)
Creatinine, Ser: 0.83 mg/dL (ref 0.40–1.20)
GFR: 68.82 mL/min (ref >60.00)
Glucose, Bld: 97 mg/dL (ref 70–99)
Potassium: 4 mEq/L (ref 3.5–5.1)
Sodium: 142 mEq/L (ref 135–145)
Total Bilirubin: 1.4 mg/dL — ABNORMAL HIGH (ref 0.2–1.2)
Total Protein: 6.6 g/dL (ref 6.0–8.3)

## 2023-03-16 LAB — CBC
HCT: 38.8 % (ref 36.0–46.0)
Hemoglobin: 12.8 g/dL (ref 12.0–15.0)
MCHC: 32.8 g/dL (ref 30.0–36.0)
MCV: 92 fl (ref 78.0–100.0)
Platelets: 196 10*3/uL (ref 150.0–400.0)
RBC: 4.22 Mil/uL (ref 3.87–5.11)
RDW: 14.3 % (ref 11.5–15.5)
WBC: 5.8 10*3/uL (ref 4.0–10.5)

## 2023-03-16 LAB — TSH: TSH: 2.71 u[IU]/mL (ref 0.35–5.50)

## 2023-03-16 MED ORDER — MECLIZINE HCL 25 MG PO TABS: 25.0000 mg | ORAL_TABLET | Freq: Three times a day (TID) | ORAL | 3 refills | Status: AC | PRN

## 2023-03-16 MED ORDER — POLYETHYLENE GLYCOL 3350 17 GM/SCOOP PO POWD: 17.0000 g | Freq: Every day | ORAL | 1 refills | Status: AC | PRN

## 2023-03-16 NOTE — Progress Notes (Signed)
Tara Collier is a 77 y.o. female here for a follow up of a pre-existing problem.  History of Present Illness:   Chief Complaint  Patient presents with   Hypertension   HTN Her blood pressure is elevated during this visit.  She continues taking 12.5 mg carvedilol 2x daily PO, 100 mg losartan daily PO, 10 mg Lasix as needed.  She is not taking lasix daily but is unable to tell me why.  She can measure her blood pressure at home and is willing to monitor it.  BP Readings from Last 3 Encounters:  03/16/23 (!) 176/100  03/05/23 (!) 170/86  09/25/22 (!) 185/89   Pulse Readings from Last 3 Encounters:  03/16/23 69  03/05/23 61  09/25/22 68   A fib She met with Dr Lalla Brothers regarding possible Tikosyn but she ended up backing out of this procedure due to fear She is taking Eliquis as prescribed.  Sleep apnea:  She has seen a sleep specialist and was instructed to start using a CPAP machine.  She has severe OSA. She has not picked it up yet due to not having transportation.   Constipation Needs refill on miralax Denies major concerns   Past Medical History:  Diagnosis Date   Abnormal uterine bleeding    Arthritis    Atrial fibrillation (HCC) 06/03/2013   Last Assessment & Plan:  Formatting of this note might be different from the original. Continue propafenone for rhythm control .Patient has clear symptomatic benefit with sinus rhythm maintenance. CHADS-Vasc score over 2, continue anticoagulation with eliquis.   HTN (hypertension) 06/03/2013   Last Assessment & Plan:  Formatting of this note might be different from the original. Hypertension is well controlled.  Continue current medical regimen and low sodium diet.  Reevaluate at next office visit   Hypercholesterolemia    Restless leg syndrome    Urinary incontinence 08/08/2020     Social History   Tobacco Use   Smoking status: Never   Smokeless tobacco: Never  Vaping Use   Vaping status: Never Used  Substance Use Topics    Alcohol use: No   Drug use: No    Past Surgical History:  Procedure Laterality Date   CARDIOVERSION     COLONOSCOPY WITH PROPOFOL N/A 02/20/2022   Procedure: COLONOSCOPY WITH PROPOFOL;  Surgeon: Hilarie Fredrickson, MD;  Location[facility] ENDOSCOPY;  Service: Gastroenterology;  Laterality: N/A;   ESOPHAGOGASTRODUODENOSCOPY (EGD) WITH PROPOFOL N/A 02/20/2022   Procedure: ESOPHAGOGASTRODUODENOSCOPY (EGD) WITH PROPOFOL;  Surgeon: Hilarie Fredrickson, MD;  Location: [facility] ENDOSCOPY;  Service: Gastroenterology;  Laterality: N/A;   HOT HEMOSTASIS N/A 02/20/2022   Procedure: HOT HEMOSTASIS (ARGON PLASMA COAGULATION/BICAP);  Surgeon: Hilarie Fredrickson, MD;  Location: [facility] ENDOSCOPY;  Service: Gastroenterology;  Laterality: N/A;   RADIOFREQUENCY ABLATION     ROTATOR CUFF REPAIR     TUBAL LIGATION      Family History  Problem Relation Age of Onset   Heart disease Mother    Appendicitis Mother    Appendicitis Father    Heart disease Father    Heart disease Maternal Grandmother     Allergies  Allergen Reactions   Tape Rash    USE PAPER TAPE ONLY!!!   Bee Venom Rash   Norvasc [Amlodipine] Swelling    Current Medications:   Current Outpatient Medications:    acetaminophen (TYLENOL) 325 MG tablet, Take 650 mg by mouth every 6 (six) hours as needed for moderate pain or headache., Disp: , Rfl:    acetaminophen (  TYLENOL) 500 MG tablet, Take 500 mg by mouth every 6 (six) hours as needed for mild pain or moderate pain., Disp: , Rfl:    apixaban (ELIQUIS) 5 MG TABS tablet, Take 1 tablet (5 mg total) by mouth 2 (two) times daily., Disp: 180 tablet, Rfl: 1   Ascorbic Acid (VITAMIN C) 1000 MG tablet, Take 1,000 mg by mouth daily., Disp: , Rfl:    atorvastatin (LIPITOR) 40 MG tablet, Take 1 tablet (40 mg total) by mouth daily., Disp: 90 tablet, Rfl: 3   betamethasone dipropionate 0.05 % cream, Apply topically 2 (two) times daily., Disp: 45 g, Rfl: 0   carvedilol (COREG) 12.5 MG tablet, Take 1 tablet (12.5 mg total) by  mouth 2 (two) times daily with a meal., Disp: 180 tablet, Rfl: 3   diclofenac Sodium (VOLTAREN) 1 % GEL, APPLY 2 GRAMS TOPICALLY 4 (FOUR) TIMES DAILY AS NEEDED (KNEE PAIN)., Disp: 100 g, Rfl: 3   ferrous sulfate 325 (65 FE) MG tablet, Take 1 tablet (325 mg total) by mouth daily. (Patient taking differently: Take 325 mg by mouth every other day.), Disp: 30 tablet, Rfl: 1   furosemide (LASIX) 20 MG tablet, Take 1 tablet (20 mg total) by mouth daily. (Patient taking differently: Take 10 mg by mouth daily.), Disp: 90 tablet, Rfl: 3   gabapentin (NEURONTIN) 100 MG capsule, TAKE 1 CAPSULE AT BEDTIME, Disp: 90 capsule, Rfl: 3   losartan (COZAAR) 100 MG tablet, Take 1 tablet (100 mg total) by mouth daily., Disp: 90 tablet, Rfl: 3   MYRBETRIQ 50 MG TB24 tablet, Take 50 mg by mouth daily., Disp: , Rfl:    senna (SENOKOT) 8.6 MG TABS tablet, Take 2 tablets by mouth at bedtime as needed for mild constipation., Disp: , Rfl:    meclizine (ANTIVERT) 25 MG tablet, Take 1 tablet (25 mg total) by mouth 3 (three) times daily as needed for dizziness., Disp: 30 tablet, Rfl: 3   polyethylene glycol powder (GLYCOLAX/MIRALAX) 17 GM/SCOOP powder, Take 17 g by mouth daily as needed for mild constipation., Disp: 255 g, Rfl: 1   Review of Systems:   Review of Systems  Skin:        (+)burning pain on left feet (+)red spots on lower left leg    Vitals:   Vitals:   03/16/23 1047 03/16/23 1132  BP: (!) 180/100 (!) 176/100  Pulse: 69   Temp: (!) 97.5 F (36.4 C)   TempSrc: Temporal   SpO2: 96%   Weight: 236 lb 4 oz (107.2 kg)   Height: 5\' 2"  (1.575 m)      Body mass index is 43.21 kg/m.  Physical Exam:   Physical Exam Vitals and nursing note reviewed.  Constitutional:      General: She is not in acute distress.    Appearance: She is well-developed. She is not ill-appearing or toxic-appearing.  Cardiovascular:     Rate and Rhythm: Normal rate and regular rhythm.     Pulses: Normal pulses.     Heart  sounds: Normal heart sounds, S1 normal and S2 normal.  Pulmonary:     Effort: Pulmonary effort is normal.     Breath sounds: Normal breath sounds.  Skin:    General: Skin is warm and dry.  Neurological:     Mental Status: She is alert.     GCS: GCS eye subscore is 4. GCS verbal subscore is 5. GCS motor subscore is 6.  Psychiatric:        Speech: Speech  normal.        Behavior: Behavior normal. Behavior is cooperative.     Assessment and Plan:   Primary hypertension Above goal today No evidence of end-organ damage on my exam Recommend patient monitor home blood pressure at least a few times weekly Continue carvedilol 12.5 mg twice daily, losartan 100 mg daily I have asked her to reach out to her cardiologist about this Likely resume lasix 20 mg daily if BMP permits Discussed that she needs to prioritize getting CPAP and starting OSA treatment(s)  If home monitoring shows consistent elevation, or any symptom(s) develop, recommend reach out to [place] for further advice on next steps  Severe obstructive sleep apnea Uncontrolled Discussed that she needs to prioritize getting CPAP and starting OSA treatment(s)   Constipation, unspecified constipation type Refill miralax per request  Permanent atrial fibrillation [facility]) Discussed that she needs to prioritize getting CPAP and starting OSA treatment(s)  Discussed need to reach out to Dr Lalla Brothers as she is telling me that she is now interested in Tikosyn admission again  [employer] as a Neurosurgeon for [employer], PA.,have documented all relevant documentation on the behalf of Jarold Motto, PA,as directed by  Jarold Motto, PA while in the presence of Jarold Motto, [place].  I, Jarold Motto, [place], have reviewed all documentation for this visit. The documentation on 03/16/23 for the exam, diagnosis, procedures, and orders are all accurate and complete.  Jarold Motto, PA-C

## 2023-03-16 NOTE — Patient Instructions (Signed)
It was great to see you!  Please start your CPAP -- this is very high priority!  Call [facility] at [facility] and ask to speak with Burna Mortimer or another nurse to help figure this out Please schedule an appointment with their office in a week or so if you can regarding your very elevated blood pressure  Please start checking your blood pressure regularly and document  Please call Dr Lalla Brothers (electrophysiology office) to discuss if you'd like to proceed with the recommended procedure.   Take care,  Jarold Motto PA-C

## 2023-03-17 ENCOUNTER — Telehealth: Payer: Self-pay | Admitting: Internal Medicine

## 2023-03-17 NOTE — Telephone Encounter (Signed)
Daughter called to follow-up on getting CPAP equipment.

## 2023-03-19 NOTE — Telephone Encounter (Signed)
Patient's daughter is calling to get update on CPAP. Requesting call back.

## 2023-03-25 NOTE — Telephone Encounter (Signed)
Left VM, per DPR, with callback number for sleep study results and recommendations.

## 2023-04-03 ENCOUNTER — Telehealth: Payer: Self-pay | Admitting: Internal Medicine

## 2023-04-03 DIAGNOSIS — I4819 Other persistent atrial fibrillation: Secondary | ICD-10-CM

## 2023-04-03 MED ORDER — APIXABAN 5 MG PO TABS: 5.0000 mg | ORAL_TABLET | Freq: Two times a day (BID) | ORAL | 0 refills | Status: DC

## 2023-04-03 NOTE — Telephone Encounter (Signed)
*  STAT* If patient is at the pharmacy, call can be transferred to refill team.   1. Which medications need to be refilled? (please list name of each medication and dose if known) apixaban (ELIQUIS) 5 MG TABS tablet  2. Which pharmacy/location (including street and city if local pharmacy) is medication to be sent to?[id number] - [place], [place] - [address]  3. Do they need a 30 day or 90 day supply? 90 Day Supply   Pt is currently out of the medication.

## 2023-04-03 NOTE — Telephone Encounter (Signed)
Prescription refill request for Eliquis received. Indication: a fib Last office visit: 09/25/22 Scr: 0.83 03/16/23 epic Age: 77 Weight: 107kg

## 2023-04-05 NOTE — Progress Notes (Unsigned)
Cardiology Clinic Note   Date: 04/07/2023 ID: Tara Collier, DOB 1946-03-30, MRN [id number]  Primary Cardiologist:  Parke Poisson, MD  Patient Profile    Tara Collier is a 77 y.o. female who presents to the clinic today for routine follow up.     Past medical history significant for: DOE.  Echo 02/20/2022: EF 60 to 65%.  Diastolic function could not be evaluated.  Normal RV function.  Mild BAE.  Trivial MR.  Aortic valve sclerosis/calcification without stenosis. Persistent A-fib.  Ablation 2013. ZIO monitor 10/05/2020: Patient only wore monitor for 7 hours.  It showed rate controlled A-fib for at least 94% of the time. Hypertension.  Hyperlipidemia.  OSA.      History of Present Illness    Tara Collier was first.  By Dr. Jacques Navy on 09/13/2020 to establish care for A-fib at the request of Dr. Bufford Buttner.  Patient had recently moved to the area to be closer to her daughter.  At the time of her visit she reported DOE with wheezing.  A-fib history included ablation in 2013.  She had been taking propafenone that was stopped in 2014 for an unknown reason.  She continues to be anticoagulated with Eliquis.  She reported lower extremity edema improved after discontinuing Eliquis.  She underwent echo which showed normal LV/RV function, mildly elevated PA pressure, RAE, and mild aortic valve sclerosis without stenosis.  Patient wore ZIO monitor for 7 hours which showed A-fib for at least 94% of the time.  Upon review sinus rhythm was not clear on monitor strips.  Patient referred to A-fib clinic.  She underwent nuclear stress test which was a normal low risk study in anticipation of restarting propafenone.  Patient was seen by Dr. Lalla Brothers on 06/04/2022 to discuss options for A-fib management.  Plan was to see if patient could maintain NSR for any period of time to guide if catheter ablation would be an option.  Discussed Tikosyn loading and DCCV and patient was interested in proceeding.  Patient was last  seen in the office by Dr. Jacques Navy on 09/25/2022 for follow-up.  She had undergone sleep study with Dr. Tresa Endo and was found to have severe sleep apnea but had not followed up for CPAP titration at the time of her visit.  She was hypertensive with BP 185/89.  Today, patient is here alone. She is a difficult historian with tangential speech. She reports she has improved energy most of the time but "sometimes I can't do anything." She has issues with constipation. It sounds like she inconsistently takes Miralax and laxatives. She admits to not hydrating well. She reports developing bilateral jaw pain that radiates down her neck after her teeth started breaking and falling out. She has not seen a dentist in years and is encouraged to do so. She denies chest pain. She tries to walk for exercise around her house. She was taking 1/2 tablet of Lasix but felt her weight was going up so she went back to the full tablet and feels it manages her lower extremity edema and dyspnea much better.  Overall she has no cardiac awareness of arrhythmia but will on occasion feel her heart "jump." She denies blood in urine or stool. She reports she has not started on CPAP. She thinks her daughter left a message about it. Discussed last visit with Dr. Lalla Brothers. She states she became scared about needing to be in the hospital to start the medication. She would like to know if this is still an  option for her.      ROS: All other systems reviewed and are otherwise negative except as noted in History of Present Illness.  Studies Reviewed    EKG Interpretation Date/Time:  Tuesday April 07 2023 09:06:59 EDT Ventricular Rate:  58 PR Interval:    QRS Duration:  88 QT Interval:  452 QTC Calculation: 443 R Axis:   52  Text Interpretation: Atrial fibrillation with slow ventricular response Low voltage QRS Septal infarct , age undetermined When compared with ECG of 18-Feb-2022 15:19, No significant changes Confirmed by Carlos Levering [phone]) on 04/07/2023 9:18:03 AM    Risk Assessment/Calculations     CHA2DS2-VASc Score = 4   This indicates a 4.8% annual risk of stroke. The patient's score is based upon: CHF History: 0 HTN History: 1 Diabetes History: 0 Stroke History: 0 Vascular Disease History: 0 Age Score: 2 Gender Score: 1             Physical Exam    VS:  BP 134/78   Pulse 60   Ht 5\' 2"  (1.575 m)   Wt 237 lb 6.4 oz (107.7 kg)   SpO2 97%   BMI 43.42 kg/m  , BMI Body mass index is 43.42 kg/m.  GEN: Well nourished, well developed, in no acute distress. Neck: No JVD or carotid bruits. Cardiac: Irregular rhythm, controlled rate. . No murmurs. No rubs or gallops.   Respiratory:  Respirations regular and unlabored. Clear to auscultation without rales, wheezing or rhonchi. GI: Soft, nontender, nondistended. Extremities: Radials/DP/PT 2+ and equal bilaterally. No clubbing or cyanosis. No edema.  Skin: Warm and dry, no rash. Neuro: Strength intact.  Assessment & Plan    Persistent A-fib.  S/p ablation 2013.  ZIO worn for 7 hours February 2022 showed A-fib 94% of the time. Overall patient has no cardiac awareness of arrhythmia. She reports occasionally feeling her heart "jump."   Denies spontaneous bleeding concerns. EKG shows afib with slow ventricular response, 58 bpm. Patient would like to know if starting Tikosyn is still an option for her. Will have her follow up with afib clinic to discuss.   Continue carvedilol, Eliquis. Hypertension: BP today 134/78. Patient denies headaches, dizziness or vision changes. Continue carvedilol and losartan. DOE.  Echo June 2023 showed normal LV/RV function, mild BAE.  Stress echo in 2022 was a normal low risk study.  Patient reports better energy. She has been trying to walk for exercise around her house. She will still have to stop occasionally. She was taking half a tablet of Lasix for a short period of time but felt her weight was going up so she increased  back to the whole tablet. She feels weight and breathing are improved. She has no lower extremity edema today. Euvolemic and well compensated on exam. I feel a big contributor to her dyspnea is deconditioning. She is encouraged to continue walking and increase as tolerated. Continue Lasix.  OSA. Patient has not yet started CPAP. She thinks her daughter has contacted the office regarding this. Ashley Mariner, LPN spoke with patient and will place order for CPAP.   Disposition: Follow up with afib clinic. Return in 6 months or sooner as needed. Patient is instructed to see a dentist as soon as possible.         Signed, Etta Grandchild. Vertis Scheib, DNP, NP-C

## 2023-04-06 ENCOUNTER — Ambulatory Visit: Payer: Medicare HMO | Admitting: Student

## 2023-04-07 ENCOUNTER — Encounter: Payer: Self-pay | Admitting: Student

## 2023-04-07 ENCOUNTER — Ambulatory Visit: Payer: Medicare HMO | Attending: Student | Admitting: Student

## 2023-04-07 ENCOUNTER — Telehealth: Payer: Self-pay

## 2023-04-07 VITALS — BP 134/78 | HR 60 | Ht 62.0 in | Wt 237.4 lb

## 2023-04-07 DIAGNOSIS — I4819 Other persistent atrial fibrillation: Secondary | ICD-10-CM | POA: Diagnosis not present

## 2023-04-07 DIAGNOSIS — G4733 Obstructive sleep apnea (adult) (pediatric): Secondary | ICD-10-CM | POA: Diagnosis not present

## 2023-04-07 DIAGNOSIS — I10 Essential (primary) hypertension: Secondary | ICD-10-CM | POA: Diagnosis not present

## 2023-04-07 DIAGNOSIS — R0609 Other forms of dyspnea: Secondary | ICD-10-CM

## 2023-04-07 MED ORDER — APIXABAN 5 MG PO TABS: 5.0000 mg | ORAL_TABLET | Freq: Two times a day (BID) | ORAL | 0 refills | Status: DC

## 2023-04-07 MED ORDER — CARVEDILOL 12.5 MG PO TABS: 12.5000 mg | ORAL_TABLET | Freq: Two times a day (BID) | ORAL | 3 refills | Status: DC

## 2023-04-07 MED ORDER — LOSARTAN POTASSIUM 100 MG PO TABS: 100.0000 mg | ORAL_TABLET | Freq: Every day | ORAL | 3 refills | Status: DC

## 2023-04-07 MED ORDER — FUROSEMIDE 20 MG PO TABS: 20.0000 mg | ORAL_TABLET | Freq: Every day | ORAL | 3 refills | Status: AC

## 2023-04-07 MED ORDER — ATORVASTATIN CALCIUM 40 MG PO TABS: 40.0000 mg | ORAL_TABLET | Freq: Every day | ORAL | 3 refills | Status: DC

## 2023-04-07 NOTE — Patient Instructions (Signed)
Medication Instructions:  Your refills have been sent to the pharmacy *If you need a refill on your cardiac medications before your next appointment, please call your pharmacy*   Lab Work: none If you have labs (blood work) drawn today and your tests are completely normal, you will receive your results only by: MyChart Message (if you have MyChart) OR A paper copy in the mail If you have any lab test that is abnormal or we need to change your treatment, we will call you to review the results.   Testing/Procedures: none   Follow-Up: At [facility], you and your health needs are our priority.  As part of our continuing mission to provide you with exceptional heart care, we have created designated Provider Care Teams.  These Care Teams include your primary Cardiologist (physician) and Advanced Practice Providers (APPs -  Physician Assistants and Nurse Practitioners) who all work together to provide you with the care you need, when you need it.  We recommend signing up for the patient portal called "MyChart".  Sign up information is provided on this After Visit Summary.  MyChart is used to connect with patients for Virtual Visits (Telemedicine).  Patients are able to view lab/test results, encounter notes, upcoming appointments, etc.  Non-urgent messages can be sent to your provider as well.   To learn more about what you can do with MyChart, go to [email or web address].    Your next appointment:   6 month(s)  Provider:   Parke Poisson, MD     Other Instructions: MAKE A APPOINTMENT TO SEE YOUR DENTIST AS SOON AS POSSIBLE.  MAKE AN APPOINTMENT FOR THE A-FIB CLINIC.

## 2023-04-07 NOTE — Telephone Encounter (Signed)
Notified patient of sleep study results and recommendations. Patient verbalized understanding. Order for new CPAP machine and supplies ordered through [employer] today, 04/07/23.

## 2023-04-15 ENCOUNTER — Other Ambulatory Visit: Payer: Self-pay | Admitting: Cardiology

## 2023-07-20 ENCOUNTER — Telehealth: Payer: Self-pay | Admitting: Internal Medicine

## 2023-07-20 DIAGNOSIS — I4819 Other persistent atrial fibrillation: Secondary | ICD-10-CM

## 2023-07-20 NOTE — Telephone Encounter (Signed)
*  STAT* If patient is at the pharmacy, call can be transferred to refill team.   1. Which medications need to be refilled? (please list name of each medication and dose if known) Eliquis  and Carvedilol new prescription - changing pharmacy   2. Would you like to learn more about the convenience, safety, & potential cost savings by using the [facility]?    re you open to using the [facility] (Type [facility].    4. Which pharmacy/location (including street and city if local pharmacy) is medication to be sent to? [facility]  [address], [place]    5. Do they need a 30 day or 90 day supply? 90 days and refills for Eliquisi  For  carvedilol- she said she is supposed to have refills left

## 2023-07-21 MED ORDER — APIXABAN 5 MG PO TABS: 5.0000 mg | ORAL_TABLET | Freq: Two times a day (BID) | ORAL | 1 refills | Status: DC

## 2023-07-21 MED ORDER — CARVEDILOL 12.5 MG PO TABS: 12.5000 mg | ORAL_TABLET | Freq: Two times a day (BID) | ORAL | 2 refills | Status: DC

## 2023-07-21 NOTE — Telephone Encounter (Signed)
Carvedilol sent to pharmacy. Please review Eliquis. Thanks

## 2023-07-21 NOTE — Telephone Encounter (Signed)
Prescription refill request for Eliquis received. Indication: AF Last office visit: 04/07/23  D Whittenborn NP Scr: 0.83 on 03/16/23  Epic Age: 77 Weight: 107.7kg  Based on above findings Eliquis 5mg  twice daily is the appropriate dose.  Refill approved.

## 2023-09-23 ENCOUNTER — Other Ambulatory Visit: Payer: Self-pay | Admitting: Internal Medicine

## 2023-09-23 NOTE — Telephone Encounter (Signed)
Copied from CRM [id number]. Topic: Clinical - Medication Refill >> Sep 23, 2023  2:23 PM Fonda Kinder J wrote: Most Recent Primary Care Visit:  Provider: Jarold Motto  Department: [facility]  Visit Type: OFFICE VISIT  Date: 03/16/2023  Medication: gabapentin (NEURONTIN) 100 MG losartan (COZAAR) 100 MG tablet   Has the patient contacted their pharmacy? Yes (Agent: If no, request that the patient contact the pharmacy for the refill. If patient does not wish to contact the pharmacy document the reason why and proceed with request.) (Agent: If yes, when and what did the pharmacy advise?)  Is this the correct pharmacy for this prescription? Yes If no, delete pharmacy and type the correct one.  This is the patient's preferred pharmacy:   [id number] [place], [place] - [address] AT [facility] [address] [place] [address] Phone: [phone] Fax: [phone]   Has the prescription been filled recently? No  Is the patient out of the medication? Yes  Has the patient been seen for an appointment in the last year OR does the patient have an upcoming appointment? No  Can we respond through MyChart? No, please give a call  Agent: Please be advised that Rx refills may take up to 3 business days. We ask that you follow-up with your pharmacy.

## 2023-10-01 ENCOUNTER — Telehealth: Payer: Self-pay | Admitting: *Deleted

## 2023-10-01 ENCOUNTER — Telehealth: Payer: Self-pay | Admitting: Internal Medicine

## 2023-10-01 MED ORDER — LOSARTAN POTASSIUM 100 MG PO TABS: 100.0000 mg | ORAL_TABLET | Freq: Every day | ORAL | 2 refills | Status: DC

## 2023-10-01 NOTE — Telephone Encounter (Signed)
*  STAT* If patient is at the pharmacy, call can be transferred to refill team.   1. Which medications need to be refilled? (please list name of each medication and dose if known) losartan  (COZAAR ) 100 MG tablet  Take 1 tablet (100 mg total) by mouth daily.   2. Would you like to learn more about the convenience, safety, & potential cost savings by using the [facility]? No     3. Are you open to using the [facility] No   4. Which pharmacy/location (including street and city if local pharmacy) is medication to be sent to? [id number] [place], [place] - [address] AT [address]    5. Do they need a 30 day or 90 day supply? 90 Day Supply  Pt is currently out of medication.

## 2023-10-01 NOTE — Telephone Encounter (Signed)
 Copied from CRM [id number]. Topic: Clinical - Prescription Issue >> Oct 01, 2023 10:01 AM Tara Collier wrote: Reason for CRM: Patient called in regarding her prescriptions gabapentin  (NEURONTIN ) 100 MG losartan  (COZAAR ) 100 MG tablet , stating the pharmacy still doesn't have it and she called it in on 01/29 , would like someone to give her a call regarding this

## 2023-10-01 NOTE — Telephone Encounter (Signed)
 Pt's medication was sent to pt's pharmacy as requested. Confirmation received.

## 2023-10-01 NOTE — Telephone Encounter (Signed)
 Left message on voicemail to call office. Please tell pt I have not received refill requests for medications. Need to know what pharmacy to send to?

## 2023-10-02 ENCOUNTER — Other Ambulatory Visit: Payer: Self-pay | Admitting: Internal Medicine

## 2023-10-02 ENCOUNTER — Other Ambulatory Visit: Payer: Self-pay | Admitting: Physician Assistant

## 2023-10-02 MED ORDER — GABAPENTIN 100 MG PO CAPS: 100.0000 mg | ORAL_CAPSULE | Freq: Every day | ORAL | 3 refills | Status: DC

## 2023-10-02 NOTE — Telephone Encounter (Signed)
 Per chart, additional refills are available for requested medications. Per chart, medications have been sent to the correct pharmacy. This RN attempted to make patient aware, no answer.

## 2023-10-02 NOTE — Telephone Encounter (Signed)
 Left message on voicemail to call office.

## 2023-10-02 NOTE — Telephone Encounter (Signed)
 Spoke to pt's daughter Tara Collier, she said you are calling about Rx's, I called back yesterday to [facility]. Told her I am sorry I did not get the message and looks like the Losartan  was sent in by her heart provider. Told her I will [facility]. Tara Collier verbalized understanding and will let her mother know.

## 2023-10-02 NOTE — Telephone Encounter (Signed)
 Copied from CRM [id number]. Topic: Clinical - Medication Refill >> Oct 02, 2023 12:44 PM [name] wrote: Most Recent Primary Care Visit:  Provider: WORLEY, SAMANTHA  Department: [facility]  Visit Type: OFFICE VISIT  Date: 03/16/2023  Medication: gabapentin  (NEURONTIN ) 100 MG losartan  (COZAAR ) 100 MG tablet   Has the patient contacted their pharmacy? Yes, 2nd request - patient called on 09/23/23  (Agent: If no, request that the patient contact the pharmacy for the refill. If patient does not wish to contact the pharmacy document the reason why and proceed with request.) (Agent: If yes, when and what did the pharmacy advise?)  Is this the correct pharmacy for this prescription? Yes If no, delete pharmacy and type the correct one.  This is the patient's preferred pharmacy:   [id number] [place], [place] - [address] AT [facility] [place] [address] Phone: [phone] Fax: [phone]   Has the prescription been filled recently? Yes  Is the patient out of the medication? Yes  Has the patient been seen for an appointment in the last year OR does the patient have an upcoming appointment? Yes  Can we respond through MyChart? No  Agent: Please be advised that Rx refills may take up to 3 business days. We ask that you follow-up with your pharmacy.

## 2023-10-20 ENCOUNTER — Ambulatory Visit (INDEPENDENT_AMBULATORY_CARE_PROVIDER_SITE_OTHER): Payer: Medicare HMO

## 2023-10-20 VITALS — Ht 62.0 in | Wt 237.0 lb

## 2023-10-20 DIAGNOSIS — Z0000 Encounter for general adult medical examination without abnormal findings: Secondary | ICD-10-CM

## 2023-10-20 NOTE — Progress Notes (Signed)
 Subjective:   Tara Collier is a 78 y.o. who presents for a Medicare Wellness preventive visit.  Visit Complete: Virtual I connected with  Tara Collier on 10/20/23 by a audio enabled telemedicine application and verified that I am speaking with the correct person using two identifiers.  Patient Location: Home  Provider Location: Office/Clinic  I discussed the limitations of evaluation and management by telemedicine. The patient expressed understanding and agreed to proceed.  Vital Signs: Because this visit was a virtual/telehealth visit, some criteria may be missing or patient reported. Any vitals not documented were not able to be obtained and vitals that have been documented are patient reported.    AWV Questionnaire: No: Patient Medicare AWV questionnaire was not completed prior to this visit.  Cardiac Risk Factors include: advanced age (>42men, >33 women);dyslipidemia;hypertension;obesity (BMI >30kg/m2)     Objective:    Today's Vitals   10/20/23 1545  Weight: 237 lb (107.5 kg)  Height: 5\' 2"  (1.575 m)   Body mass index is 43.35 kg/m.     10/20/2023    3:59 PM 10/16/2022    4:18 PM 06/11/2022    8:29 PM 02/20/2022    9:57 AM 02/18/2022    3:27 PM  Advanced Directives  Does Patient Have a Medical Advance Directive? No No No No No  Would patient like information on creating a medical advance directive? No - Patient declined No - Patient declined Yes (MAU/Ambulatory/Procedural Areas - Information given) No - Patient declined No - Patient declined    Current Medications (verified) Outpatient Encounter Medications as of 10/20/2023  Medication Sig   acetaminophen (TYLENOL) 325 MG tablet Take 650 mg by mouth every 6 (six) hours as needed for moderate pain or headache.   acetaminophen (TYLENOL) 500 MG tablet Take 500 mg by mouth every 6 (six) hours as needed for mild pain or moderate pain.   apixaban (ELIQUIS) 5 MG TABS tablet Take 1 tablet (5 mg total) by mouth 2 (two) times  daily.   Ascorbic Acid (VITAMIN C) 1000 MG tablet Take 1,000 mg by mouth daily.   atorvastatin (LIPITOR) 40 MG tablet Take 1 tablet (40 mg total) by mouth daily.   betamethasone dipropionate 0.05 % cream Apply topically 2 (two) times daily.   carvedilol (COREG) 12.5 MG tablet Take 1 tablet (12.5 mg total) by mouth 2 (two) times daily with a meal.   diclofenac Sodium (VOLTAREN) 1 % GEL APPLY 2 GRAMS TOPICALLY 4 (FOUR) TIMES DAILY AS NEEDED (KNEE PAIN).   furosemide (LASIX) 20 MG tablet Take 1 tablet (20 mg total) by mouth daily.   gabapentin (NEURONTIN) 100 MG capsule Take 1 capsule (100 mg total) by mouth at bedtime.   losartan (COZAAR) 100 MG tablet Take 1 tablet (100 mg total) by mouth daily.   polyethylene glycol powder (GLYCOLAX/MIRALAX) 17 GM/SCOOP powder Take 17 g by mouth daily as needed for mild constipation.   senna (SENOKOT) 8.6 MG TABS tablet Take 2 tablets by mouth at bedtime as needed for mild constipation.   ferrous sulfate 325 (65 FE) MG tablet Take 1 tablet (325 mg total) by mouth daily. (Patient taking differently: Take 325 mg by mouth every other day.)   meclizine (ANTIVERT) 25 MG tablet Take 1 tablet (25 mg total) by mouth 3 (three) times daily as needed for dizziness. (Patient not taking: Reported on 10/20/2023)   MYRBETRIQ 50 MG TB24 tablet Take 50 mg by mouth daily. (Patient not taking: Reported on 10/20/2023)   No facility-administered encounter medications  on file as of 10/20/2023.    Allergies (verified) Tape, Bee venom, and Norvasc [amlodipine]   History: Past Medical History:  Diagnosis Date   Abnormal uterine bleeding    Arthritis    Atrial fibrillation (HCC) 06/03/2013   Last Assessment & Plan:  Formatting of this note might be different from the original. Continue propafenone for rhythm control .Patient has clear symptomatic benefit with sinus rhythm maintenance. CHADS-Vasc score over 2, continue anticoagulation with eliquis.   HTN (hypertension) 06/03/2013    Last Assessment & Plan:  Formatting of this note might be different from the original. Hypertension is well controlled.  Continue current medical regimen and low sodium diet.  Reevaluate at next office visit   Hypercholesterolemia    Restless leg syndrome    Urinary incontinence 08/08/2020   Past Surgical History:  Procedure Laterality Date   CARDIOVERSION     COLONOSCOPY WITH PROPOFOL N/A 02/20/2022   Procedure: COLONOSCOPY WITH PROPOFOL;  Surgeon: Hilarie Fredrickson, MD;  Location: [facility] ENDOSCOPY;  Service: Gastroenterology;  Laterality: N/A;   ESOPHAGOGASTRODUODENOSCOPY (EGD) WITH PROPOFOL N/A 02/20/2022   Procedure: ESOPHAGOGASTRODUODENOSCOPY (EGD) WITH PROPOFOL;  Surgeon: Hilarie Fredrickson, MD;  Location: [facility] ENDOSCOPY;  Service: Gastroenterology;  Laterality: N/A;   HOT HEMOSTASIS N/A 02/20/2022   Procedure: HOT HEMOSTASIS (ARGON PLASMA COAGULATION/BICAP);  Surgeon: Hilarie Fredrickson, MD;  Location: [facility] ENDOSCOPY;  Service: Gastroenterology;  Laterality: N/A;   RADIOFREQUENCY ABLATION     ROTATOR CUFF REPAIR     TUBAL LIGATION     Family History  Problem Relation Age of Onset   Heart disease Mother    Appendicitis Mother    Appendicitis Father    Heart disease Father    Heart disease Maternal Grandmother    Social History   Socioeconomic History   Marital status: Single    Spouse name: Not on file   Number of children: Not on file   Years of education: Not on file   Highest education level: Not on file  Occupational History   Not on file  Tobacco Use   Smoking status: Never   Smokeless tobacco: Never  Vaping Use   Vaping status: Never Used  Substance and Sexual Activity   Alcohol use: No   Drug use: No   Sexual activity: Not Currently    Birth control/protection: Surgical    Comment: tubal  Other Topics Concern   Not on file  Social History Narrative   Daughter in [place]   Lives by herself   Worked for [employer] for 20+ years   Social Drivers of Corporate investment banker  Strain: Low Risk  (10/20/2023)   Overall Financial Resource Strain (CARDIA)    Difficulty of Paying Living Expenses: Not hard at all  Food Insecurity: No Food Insecurity (10/20/2023)   Hunger Vital Sign    Worried About Running Out of Food in the Last Year: Never true    Ran Out of Food in the Last Year: Never true  Transportation Needs: No Transportation Needs (10/20/2023)   PRAPARE - Administrator, Civil Service (Medical): No    Lack of Transportation (Non-Medical): No  Physical Activity: Inactive (10/20/2023)   Exercise Vital Sign    Days of Exercise per Week: 0 days    Minutes of Exercise per Session: 0 min  Stress: No Stress Concern Present (10/20/2023)   [employer] of Occupational Health - Occupational Stress Questionnaire    Feeling of Stress : Only a little  Social Connections: Socially Isolated (10/20/2023)   Social Connection and Isolation Panel [NHANES]    Frequency of Communication with Friends and Family: More than three times a week    Frequency of Social Gatherings with Friends and Family: Once a week    Attends Religious Services: Never    Database administrator or Organizations: No    Attends Engineer, structural: Never    Marital Status: Never married    Tobacco Counseling Counseling given: Not Answered    Clinical Intake:  Pre-visit preparation completed: Yes  Pain : No/denies pain     BMI - recorded: 43.35 Nutritional Status: BMI > 30  Obese Nutritional Risks: None Diabetes: No  How often do you need to have someone help you when you read instructions, pamphlets, or other written materials from your doctor or pharmacy?: 1 - Never  Interpreter Needed?: No  Information entered by :: Lanier Ensign, LPN   Activities of Daily Living      10/20/2023    3:49 PM  In your present state of health, do you have any difficulty performing the following activities:  Hearing? 0  Vision? 0  Difficulty concentrating or making  decisions? 0  Walking or climbing stairs? 1  Comment take it easy  Dressing or bathing? 0  Doing errands, shopping? 0  Preparing Food and eating ? N  Using the Toilet? N  In the past six months, have you accidently leaked urine? Y  Comment wears a brief  Do you have problems with loss of bowel control? N  Managing your Medications? N  Managing your Finances? N  Housekeeping or managing your Housekeeping? N    Patient Care Team: Jarold Motto, [place] as PCP - General (Physician Assistant) Parke Poisson, MD as PCP - Cardiology (Cardiology) Patton Salles, MD as Consulting Physician (Obstetrics and Gynecology)  Indicate any recent Medical Services you may have received from other than [facility] providers in the past year (date may be approximate).     Assessment:   This is a routine wellness examination for [facility].  Hearing/Vision screen Hearing Screening - Comments:: Pt denies any hearing issues  Vision Screening - Comments:: Pt follows up with Dr Shea Evans for annual eye exaMS    Goals Addressed             This Visit's Progress    Patient Stated       To walk       Depression Screen     10/20/2023    3:57 PM 03/16/2023   10:51 AM 03/05/2023   11:18 AM 03/05/2023   11:09 AM 10/16/2022    4:13 PM 05/27/2022    3:14 PM 08/08/2020    8:34 AM  PHQ 2/9 Scores  PHQ - 2 Score 1 0 2 0 0 0 0  PHQ- 9 Score  0 9        Fall Risk     10/20/2023    4:00 PM 03/05/2023   11:17 AM 03/05/2023   11:09 AM 10/16/2022    4:19 PM  Fall Risk   Falls in the past year? 1 1 0 0  Number falls in past yr: 1 1 0 0  Injury with Fall? 1 1 0 0  Comment soreness     Risk for fall due to : Impaired balance/gait;Impaired mobility No Fall Risks No Fall Risks Impaired vision;Impaired balance/gait;Impaired mobility  Follow up Falls prevention discussed Falls evaluation completed Falls evaluation completed Falls prevention  discussed    MEDICARE RISK AT HOME:  Medicare Risk at Home Any  stairs in or around the home?: Yes If so, are there any without handrails?: No Home free of loose throw rugs in walkways, pet beds, electrical cords, etc?: Yes Adequate lighting in your home to reduce risk of falls?: Yes Life alert?: No Use of a cane, walker or w/c?: Yes Grab bars in the bathroom?: Yes Shower chair or bench in shower?: Yes Elevated toilet seat or a handicapped toilet?: No  TIMED UP AND GO:  Was the test performed?  No  Cognitive Function: 6CIT completed        10/20/2023    4:02 PM 10/16/2022    4:26 PM  6CIT Screen  What Year? 0 points 0 points  What month? 0 points 0 points  What time? 0 points 0 points  Count back from 20 0 points 0 points  Months in reverse 0 points 4 points  Repeat phrase 0 points 0 points  Total Score 0 points 4 points    Immunizations Immunization History  Administered Date(s) Administered   Fluad Quad(high Dose 65+) 05/27/2022   Influenza, High Dose Seasonal PF 06/06/2020   Moderna Sars-Covid-2 Vaccination 12/19/2019, 01/16/2020, 07/25/2020   PNEUMOCOCCAL CONJUGATE-20 05/27/2022   Pneumococcal Conjugate-13 10/06/2017    Screening Tests Health Maintenance  Topic Date Due   Hepatitis C Screening  Never done   DTaP/Tdap/Td (1 - Tdap) Never done   Zoster Vaccines- Shingrix (1 of 2) Never done   DEXA SCAN  Never done   INFLUENZA VACCINE  03/26/2023   COVID-19 Vaccine (4 - 2024-25 season) 04/26/2023   Medicare Annual Wellness (AWV)  10/19/2024   Pneumonia Vaccine 33+ Years old  Completed   HPV VACCINES  Aged Out   Colonoscopy  Discontinued    Health Maintenance  Health Maintenance Due  Topic Date Due   Hepatitis C Screening  Never done   DTaP/Tdap/Td (1 - Tdap) Never done   Zoster Vaccines- Shingrix (1 of 2) Never done   DEXA SCAN  Never done   INFLUENZA VACCINE  03/26/2023   COVID-19 Vaccine (4 - 2024-25 season) 04/26/2023   Health Maintenance Items Addressed:    Additional Screening:  Vision Screening:  Recommended annual ophthalmology exams for early detection of glaucoma and other disorders of the eye.  Dental Screening: Recommended annual dental exams for proper oral hygiene  Community Resource Referral / Chronic Care Management: CRR required this visit?  No   CCM required this visit?  No     Plan:     I have personally reviewed and noted the following in the patient's chart:   Medical and social history Use of alcohol, tobacco or illicit drugs  Current medications and supplements including opioid prescriptions. Patient is not currently taking opioid prescriptions. Functional ability and status Nutritional status Physical activity Advanced directives List of other physicians Hospitalizations, surgeries, and ER visits in previous 12 months Vitals Screenings to include cognitive, depression, and falls Referrals and appointments  In addition, I have reviewed and discussed with patient certain preventive protocols, quality metrics, and best practice recommendations. A written personalized care plan for preventive services as well as general preventive health recommendations were provided to patient.     Marzella Schlein, LPN   1/61/0960   After Visit Summary: mailed AVS  Notes: Nothing significant to report at this time.

## 2023-10-20 NOTE — Patient Instructions (Addendum)
 Ms. Natarajan , Thank you for taking time to come for your Medicare Wellness Visit. I appreciate your ongoing commitment to your health goals. Please review the following plan we discussed and let me know if I can assist you in the future.   Referrals/Orders/Follow-Ups/Clinician Recommendations: Aim for 30 minutes of exercise or brisk walking, 6-8 glasses of water, and 5 servings of fruits and vegetables each day.   This is a list of the screening recommended for you and due dates:  Health Maintenance  Topic Date Due   Hepatitis C Screening  Never done   DTaP/Tdap/Td vaccine (1 - Tdap) Never done   Zoster (Shingles) Vaccine (1 of 2) Never done   DEXA scan (bone density measurement)  Never done   Flu Shot  03/26/2023   COVID-19 Vaccine (4 - 2024-25 season) 04/26/2023   Medicare Annual Wellness Visit  10/19/2024   Pneumonia Vaccine  Completed   HPV Vaccine  Aged Out   Colon Cancer Screening  Discontinued    Advanced directives: (Declined) Advance directive discussed with you today. Even though you declined this today, please call our office should you change your mind, and we can give you the proper paperwork for you to fill out.  Next Medicare Annual Wellness Visit scheduled for next year: Yes  Vaccinations: Influenza vaccine: recommend every Fall Pneumococcal vaccine: recommend once per lifetime Prevnar-20 Tdap vaccine: recommend every 10 years Shingles vaccine: recommend Shingrix which is 2 doses 2-6 months apart and over 90% effective     Covid-19: recommend 2 doses one month apart with a booster 6 months later

## 2023-11-26 ENCOUNTER — Other Ambulatory Visit: Payer: Self-pay | Admitting: Physician Assistant

## 2023-11-26 NOTE — Telephone Encounter (Signed)
 Copied from CRM [id number]. Topic: Clinical - Medication Refill >> Nov 26, 2023  3:35 PM Lorin Glass B wrote: Most Recent Primary Care Visit:  Provider: Marzella Schlein  Department: [facility]  Visit Type: MEDICARE AWV, SEQUENTIAL  Date: 10/20/2023  Medication: gabapentin (NEURONTIN) 100 MG capsule  Has the patient contacted their pharmacy? Yes, unable to get through (Agent: If no, request that the patient contact the pharmacy for the refill. If patient does not wish to contact the pharmacy document the reason why and proceed with request.) (Agent: If yes, when and what did the pharmacy advise?)  Is this the correct pharmacy for this prescription? Yes If no, delete pharmacy and type the correct one.  This is the patient's preferred pharmacy:   [id number] [place], [place] - [address] AT [facility] [address] [place] [address] Phone: [phone] Fax: [phone]   Has the prescription been filled recently? Yes  Is the patient out of the medication? No, 5 left  Has the patient been seen for an appointment in the last year OR does the patient have an upcoming appointment? Yes  Can we respond through MyChart? No  Agent: Please be advised that Rx refills may take up to 3 business days. We ask that you follow-up with your pharmacy.

## 2023-11-26 NOTE — Telephone Encounter (Signed)
 Gabapentin refilled 90 tabs 10/02/23, next refill is 12/30/23.

## 2023-12-02 ENCOUNTER — Telehealth: Payer: Self-pay | Admitting: *Deleted

## 2023-12-02 NOTE — Telephone Encounter (Signed)
 Spoke to pt's daughter Kenney Houseman, pt is taking Gabapentin 100 mg to 200 mg a day for her pain. Rx was filled on 10/02/2023 and pt only has 5 pills left. Kenney Houseman wants to know if we can increase her Gabapentin to two pills a day for her pain and send in new Rx.  Pt is scheduled to see you on 4/11 does she need to keep appt or not? Daughter will not be able to come to appt.

## 2023-12-02 NOTE — Telephone Encounter (Signed)
Tara Collier, see message.

## 2023-12-02 NOTE — Telephone Encounter (Signed)
 Copied from CRM [id number]. Topic: General - Other >> Dec 02, 2023  3:57 PM Jon Gills C wrote: Reason for CRM: Patient daughter call in wanting PA Rinaldo Cloud or nurse to give her a callback     [phone]

## 2023-12-03 MED ORDER — GABAPENTIN 100 MG PO CAPS: 200.0000 mg | ORAL_CAPSULE | Freq: Every day | ORAL | 0 refills | Status: DC

## 2023-12-03 NOTE — Addendum Note (Signed)
 Addended by: Haynes Bast on: 12/03/2023 09:21 AM   Modules accepted: Orders

## 2023-12-04 ENCOUNTER — Ambulatory Visit: Admitting: Physician Assistant

## 2023-12-07 ENCOUNTER — Ambulatory Visit: Admitting: Physician Assistant

## 2023-12-30 ENCOUNTER — Other Ambulatory Visit: Payer: Self-pay | Admitting: Physician Assistant

## 2024-01-24 ENCOUNTER — Other Ambulatory Visit: Payer: Self-pay | Admitting: Student

## 2024-02-02 ENCOUNTER — Other Ambulatory Visit: Payer: Self-pay | Admitting: Physician Assistant

## 2024-04-26 ENCOUNTER — Emergency Department (HOSPITAL_COMMUNITY)

## 2024-04-26 ENCOUNTER — Emergency Department (HOSPITAL_COMMUNITY)
Admission: EM | Admit: 2024-04-26 | Discharge: 2024-04-27 | Disposition: A | Discharge status: Home / Self Care | Attending: Emergency Medicine | Admitting: Emergency Medicine

## 2024-04-26 ENCOUNTER — Ambulatory Visit: Payer: Self-pay

## 2024-04-26 ENCOUNTER — Other Ambulatory Visit: Payer: Self-pay

## 2024-04-26 DIAGNOSIS — D259 Leiomyoma of uterus, unspecified: Secondary | ICD-10-CM | POA: Diagnosis not present

## 2024-04-26 DIAGNOSIS — N939 Abnormal uterine and vaginal bleeding, unspecified: Secondary | ICD-10-CM | POA: Diagnosis not present

## 2024-04-26 DIAGNOSIS — R030 Elevated blood-pressure reading, without diagnosis of hypertension: Secondary | ICD-10-CM | POA: Diagnosis not present

## 2024-04-26 DIAGNOSIS — R42 Dizziness and giddiness: Secondary | ICD-10-CM | POA: Insufficient documentation

## 2024-04-26 DIAGNOSIS — R9389 Abnormal findings on diagnostic imaging of other specified body structures: Secondary | ICD-10-CM | POA: Diagnosis not present

## 2024-04-26 DIAGNOSIS — I10 Essential (primary) hypertension: Secondary | ICD-10-CM | POA: Diagnosis not present

## 2024-04-26 DIAGNOSIS — M545 Low back pain, unspecified: Secondary | ICD-10-CM | POA: Diagnosis not present

## 2024-04-26 DIAGNOSIS — R102 Pelvic and perineal pain: Secondary | ICD-10-CM | POA: Diagnosis not present

## 2024-04-26 LAB — I-STAT CHEM 8, ED
BUN: 19 mg/dL (ref 8–23)
Calcium, Ion: 1.02 mmol/L — ABNORMAL LOW (ref 1.15–1.40)
Chloride: 109 mmol/L (ref 98–111)
Creatinine, Ser: 1 mg/dL (ref 0.44–1.00)
Glucose, Bld: 95 mg/dL (ref 70–99)
HCT: 34 % — ABNORMAL LOW (ref 36.0–46.0)
Hemoglobin: 11.6 g/dL — ABNORMAL LOW (ref 12.0–15.0)
Potassium: 4 mmol/L (ref 3.5–5.1)
Sodium: 142 mmol/L (ref 135–145)
TCO2: 22 mmol/L (ref 22–32)

## 2024-04-26 LAB — CBC WITH DIFFERENTIAL/PLATELET
Abs Immature Granulocytes: 0.01 K/uL (ref 0.00–0.07)
Basophils Absolute: 0 K/uL (ref 0.0–0.1)
Basophils Relative: 1 %
Eosinophils Absolute: 0.2 K/uL (ref 0.0–0.5)
Eosinophils Relative: 3 %
HCT: 37 % (ref 36.0–46.0)
Hemoglobin: 11.7 g/dL — ABNORMAL LOW (ref 12.0–15.0)
Immature Granulocytes: 0 %
Lymphocytes Relative: 16 %
Lymphs Abs: 1 K/uL (ref 0.7–4.0)
MCH: 30.1 pg (ref 26.0–34.0)
MCHC: 31.6 g/dL (ref 30.0–36.0)
MCV: 95.1 fL (ref 80.0–100.0)
Monocytes Absolute: 0.5 K/uL (ref 0.1–1.0)
Monocytes Relative: 8 %
Neutro Abs: 4.5 K/uL (ref 1.7–7.7)
Neutrophils Relative %: 72 %
Platelets: 211 K/uL (ref 150–400)
RBC: 3.89 MIL/uL (ref 3.87–5.11)
RDW: 13.7 % (ref 11.5–15.5)
WBC: 6.2 K/uL (ref 4.0–10.5)
nRBC: 0 % (ref 0.0–0.2)

## 2024-04-26 LAB — COMPREHENSIVE METABOLIC PANEL WITH GFR
ALT: 18 U/L (ref 0–44)
AST: 23 U/L (ref 15–41)
Albumin: 3.7 g/dL (ref 3.5–5.0)
Alkaline Phosphatase: 85 U/L (ref 38–126)
Anion gap: 11 (ref 5–15)
BUN: 19 mg/dL (ref 8–23)
CO2: 22 mmol/L (ref 22–32)
Calcium: 9 mg/dL (ref 8.9–10.3)
Chloride: 107 mmol/L (ref 98–111)
Creatinine, Ser: 0.89 mg/dL (ref 0.44–1.00)
GFR, Estimated: >60 mL/min (ref >60)
Glucose, Bld: 91 mg/dL (ref 70–99)
Potassium: 3.9 mmol/L (ref 3.5–5.1)
Sodium: 140 mmol/L (ref 135–145)
Total Bilirubin: 1.3 mg/dL — ABNORMAL HIGH (ref 0.0–1.2)
Total Protein: 6.4 g/dL — ABNORMAL LOW (ref 6.5–8.1)

## 2024-04-26 NOTE — ED Provider Triage Note (Signed)
 Emergency Medicine Provider Triage Evaluation Note  Mal[place] Crance , a 78 y.o. female  was evaluated in triage.  Pt complains of abnormal vaginal bleeding.  Has been chronic however noted increased bleeding over the last several days.  Other complaints of dizziness.  Review of Systems  Positive: As above Negative:   Physical Exam  BP (!) 183/98   Pulse (!) 54   Temp 98.6 F (37 C)   Resp 20   Ht 5' 2 (1.575 m)   SpO2 95%   BMI 43.35 kg/m  Gen:   Awake, no distress   Resp:  Normal effort  MSK:   Moves extremities without difficulty  Other:    Medical Decision Making  Medically screening exam initiated at 4:08 PM.  Appropriate orders placed.  Sidda Humm was informed that the remainder of the evaluation will be completed by another provider, this initial triage assessment does not replace that evaluation, and the importance of remaining in the ED until their evaluation is complete.  Begin initial workup to determine presence of anemia, also ordered pelvic ultrasound to assess for gynecologic etiology of bleeding, specifically presence of leiomyoma/endometrioma/other uterine carcinoma.   Myriam Dorn BROCKS, [place] 04/26/24 [phone]

## 2024-04-26 NOTE — Telephone Encounter (Signed)
 FYI Only or Action Required?: FYI only for provider.  Patient was last seen in primary care on 03/16/2023 by Job Lukes, PA.  Called Nurse Triage reporting Vaginal Bleeding.  Symptoms began several weeks ago.  Interventions attempted: Nothing.  Symptoms are: gradually worsening.  Triage Disposition: Go to ED Now (Notify PCP)  Patient/caregiver understands and will follow disposition?: Yes   Reason for Disposition  SEVERE abdominal pain (e.g., excruciating)  Answer Assessment - Initial Assessment Questions 1. BLEEDING SEVERITY: Describe the bleeding that you are having. How much bleeding is there?      Mild, comes and goes 2. ONSET: When did the bleeding begin? Is it continuing now?     Way back 3. MENOPAUSE: When was your last menstrual period?      Years ago, 2008 4. ABDOMEN PAIN: Do you have any pain? How bad is the pain?  (e.g., Scale 0-10; none, mild, moderate, or severe)     Moderate to severe, comes and goes but stays constant at times 5. BLOOD THINNERS: Do you take any blood thinners? (e.g., Coumadin/warfarin, Pradaxa/dabigatran, aspirin)     Yes, eliquis  6. HORMONE MEDICINES: Are you taking any hormone medicines, prescription or OTC? (e.g., birth control pills, estrogen)     denies 7. CAUSE: What do you think is causing the bleeding? (e.g., recent gyn surgery, recent gyn procedure; known bleeding disorder, uterine cancer)       unknown 8. HEMODYNAMIC STATUS: Are you weak or feeling lightheaded? If Yes, ask: Can you stand and walk normally?       States weak, unsteady, dizzy 9. OTHER SYMPTOMS: What other symptoms are you having with the bleeding? (e.g., back pain, burning with urination, fever)     Back pain,  Protocols used: Vaginal Bleeding - Postmenopausal-A-AH

## 2024-04-26 NOTE — ED Triage Notes (Signed)
 C/O lower abd pain and vaginal bleeding that started months ago. Denies rectal bleeding. C/O headache and dizziness. Denies n/v

## 2024-04-26 NOTE — ED Triage Notes (Signed)
 Pt POV c/o lower abd and back pain. Pt c/o constipation for the past few weeks and noticing intermittent vaginal bleeding.

## 2024-04-27 ENCOUNTER — Other Ambulatory Visit: Payer: Self-pay | Admitting: Student

## 2024-04-27 ENCOUNTER — Ambulatory Visit: Admitting: Physician Assistant

## 2024-04-27 ENCOUNTER — Emergency Department (HOSPITAL_COMMUNITY)

## 2024-04-27 DIAGNOSIS — G459 Transient cerebral ischemic attack, unspecified: Secondary | ICD-10-CM | POA: Diagnosis not present

## 2024-04-27 DIAGNOSIS — R519 Headache, unspecified: Secondary | ICD-10-CM | POA: Diagnosis not present

## 2024-04-27 DIAGNOSIS — R42 Dizziness and giddiness: Secondary | ICD-10-CM | POA: Diagnosis not present

## 2024-04-27 MED ORDER — OXYCODONE-ACETAMINOPHEN 5-325 MG PO TABS: 1.0000 | ORAL_TABLET | Freq: Four times a day (QID) | ORAL | 0 refills | Status: AC | PRN

## 2024-04-27 MED ORDER — CARVEDILOL 12.5 MG PO TABS
12.5000 mg | ORAL_TABLET | Freq: Two times a day (BID) | ORAL | Status: DC
  Administered 2024-04-27: 12.5 mg via ORAL
  Filled 2024-04-27: qty 1

## 2024-04-27 NOTE — ED Notes (Signed)
 Patient transported to CT

## 2024-04-27 NOTE — Discharge Instructions (Addendum)
 You need to follow-up pressure.  You need to follow-up with your primary care doctor regarding your blood pressure.  You need to follow-up with your OB/GYN regarding your vaginal bleeding and pelvic cramping.  Return to the ER for new or worsening symptoms.

## 2024-04-27 NOTE — ED Provider Notes (Signed)
 [facility] DEPT [facility] Emergency Department Provider Note MRN:  [id number]  Arrival date & time: 04/27/24     Chief Complaint   Vaginal Bleeding   History of Present Illness   Tara Collier is a 78 y.o. year-old female presents to the ED with chief complaint of pelvic pain and low back pain.  States that the symptoms has been going on for the past week or so.  She states that she notices some vaginal bleeding that is associated with the pain.   States that overall, the symptoms started about a year ago, but have been becoming slowly more frequent, but acutely worsened today.  States that she feels lightheaded.  States that she fell because she about passed out about a week ago.  Has had frequent falls.    Noted to be hypertensive in triage and in the ED, but hasn't been checking her BP at home.   States that she has been taking her medications as directed.  States that she takes Eliquis  and has been compliant.  History provided by patient.   Review of Systems  Pertinent positive and negative review of systems noted in HPI.    Physical Exam   Vitals:   04/27/24 0208 04/27/24 0215  BP: (!) 198/84 (!) 188/69  Pulse: (!) 54 63  Resp:  18  Temp:    SpO2:  96%    CONSTITUTIONAL:  non toxic-appearing, NAD NEURO:  Alert and oriented x 3, CN 3-12 grossly intact EYES:  eyes equal and reactive ENT/NECK:  Supple, no stridor  CARDIO:  bradycardic, regular rhythm, appears well-perfused  PULM:  No respiratory distress, CTAB GI/GU:  non-distended, no focal abdominal tenderness MSK/SPINE:  No gross deformities, no edema, moves all extremities  SKIN:  no rash, atraumatic   *Additional and/or pertinent findings included in MDM below  Diagnostic and Interventional Summary    EKG Interpretation Date/Time:    Ventricular Rate:    PR Interval:    QRS Duration:    QT Interval:    QTC Calculation:   R Axis:      Text Interpretation:         Labs Reviewed   COMPREHENSIVE METABOLIC PANEL WITH GFR - Abnormal; Notable for the following components:      Result Value   Total Protein 6.4 (*)    Total Bilirubin 1.3 (*)    All other components within normal limits  CBC WITH DIFFERENTIAL/PLATELET - Abnormal; Notable for the following components:   Hemoglobin 11.7 (*)    All other components within normal limits  I-STAT CHEM 8, ED - Abnormal; Notable for the following components:   Calcium , Ion 1.02 (*)    Hemoglobin 11.6 (*)    HCT 34.0 (*)    All other components within normal limits    CT HEAD WO CONTRAST ( )  Final Result    US  Pelvis Complete  Final Result    US  Transvaginal Non-OB  Final Result      Medications  carvedilol  (COREG ) tablet 12.5 mg (12.5 mg Oral Given 04/27/24 0208)     Procedures  /  Critical Care Procedures  ED Course and Medical Decision Making  I have reviewed the triage vital signs, the nursing notes, and pertinent available records from the EMR.  Social Determinants Affecting Complexity of Care: Patient has no clinically significant social determinants affecting this chief complaint..   ED Course:    Medical Decision Making Patient here with primary complaint of pelvic pain and vaginal bleeding  that has been worsening in severity and frequency for the past week or 2.  She states that she has been having the symptoms for about the past year.  She states that she has had a hysteroscopy and outpatient pelvic MRI which were both inconclusive.  She was advised to follow back up with OB/GYN, but she has not done so.  When she began having worsening pelvic pain this week, she made an appointment with her doctor, but was told by her triage nurse to come to the emergency department because of the vaginal bleeding and being on Eliquis .  Patient is noted to have hemoglobin of 11.7, most recent from 2024 was 12.8, and 12.3 in 2023.  She is not tachycardic.  She is slightly bradycardic.  She states that she did have some  lightheadedness a few weeks ago and had a stumble and almost passed out.  She  is noted to have elevated blood pressure tonight.  She states that she has been taking her blood pressure medication as directed.  She does not check her blood pressure at home.  Due to the dizziness, fall, and being anticoagulated, will check CT head.  She is noted to be bradycardic, but does not have any lightheaded or dizziness now.  She denies shortness of breath.  She not having any chest pain.  Regarding her pelvic pain and vaginal bleeding, I think that she is stable for discharge and outpatient follow-up will need to follow-up with her OB/GYN for further diagnostic evaluation.  CT head is negative.  Patient given dose of her home blood pressure medicine.  She has reliable follow-up.  No emergent process was identified tonight.  She does have several chronic problems which will require further follow-up, but I do not believe that she needs admission or further workup tonight.    Amount and/or Complexity of Data Reviewed Radiology: ordered.  Risk Prescription drug management.         Consultants: No consultations were needed in caring for this patient.   Treatment and Plan: I considered admission due to patient's initial presentation, but after considering the examination and diagnostic results, patient will not require admission and can be discharged with outpatient follow-up.    Final Clinical Impressions(s) / ED Diagnoses     ICD-10-CM   1. Vaginal bleeding  N93.9     2. Elevated blood pressure reading  R03.0       ED Discharge Orders          Ordered    oxyCODONE -acetaminophen  (PERCOCET) 5-325 MG tablet  Every 6 hours PRN        04/27/24 0158              Discharge Instructions Discussed with and Provided to Patient:     Discharge Instructions      You need to follow-up pressure.  You need to follow-up with your primary care doctor regarding your blood pressure.  You  need to follow-up with your OB/GYN regarding your vaginal bleeding and pelvic cramping.  Return to the ER for new or worsening symptoms.       Vicky Charleston, PA-C 04/27/24 0534    Carita Senior, MD 04/28/24 [phone]

## 2024-04-27 NOTE — Telephone Encounter (Signed)
 Pt went to ED

## 2024-04-29 ENCOUNTER — Telehealth: Payer: Self-pay

## 2024-04-29 DIAGNOSIS — N950 Postmenopausal bleeding: Secondary | ICD-10-CM

## 2024-04-29 NOTE — Telephone Encounter (Signed)
 Transition Care Management Unsuccessful Follow-up Telephone Call  Date of discharge and from where:  04/26/2024 [facility]  Attempts:  1st Attempt  Reason for unsuccessful TCM follow-up call:  Spoke with Glenys, pt daughter; pt is same as she was when seen in ED 04/26/24. Daughter states pt doesn't have appt with OBGYN as recommended by ED. Glenys also advised to follow up with PCP for HTN. Daughter states she can follow up with Cardiology. Wanted to bring patient in but can only come in after 3pm and no openings at this time. Glenys states wanting to discuss with Sam pt TOC to another [place] office closer to home so patient can drive herself. Tanya asked to cb this afternoon as she is in a meeting at this time.

## 2024-04-29 NOTE — Telephone Encounter (Signed)
 Spoke to pt's daughter Glenys, told her per Lucie, Patient is overdue for repeat pelvic MRI and endometrial biopsy with Dr. Cathlyn JAYSON Cary, Bobie  we will enter urgent referral for [facility] for post-menopausal bleeding and someone will contact you to schedule an appt. Tanya verbalized understanding.   Should she want to follow-up with us , I have a 3:20 pm appointment opens on this Monday and next Monday. We can work her in on Wednesday afternoon instead if needed. Tany said Wednesday at 3:20  PM would be best. Told her okay will add to schedule. Tanya verbalized understanding.   She can transfer to any office that is most convenient for her -- this is no problem. She should find the office and provider that she wants to transfer to and make an appointment to start this process. Glenys said she will let pt and provider discuss at visit and go from there. Told her okay.

## 2024-05-04 ENCOUNTER — Encounter: Payer: Self-pay | Admitting: Physician Assistant

## 2024-05-04 ENCOUNTER — Ambulatory Visit (INDEPENDENT_AMBULATORY_CARE_PROVIDER_SITE_OTHER): Admitting: Physician Assistant

## 2024-05-04 VITALS — BP 140/80 | HR 58 | Temp 97.3°F | Ht 62.0 in | Wt 249.0 lb

## 2024-05-04 DIAGNOSIS — I10 Essential (primary) hypertension: Secondary | ICD-10-CM

## 2024-05-04 DIAGNOSIS — N950 Postmenopausal bleeding: Secondary | ICD-10-CM | POA: Diagnosis not present

## 2024-05-04 DIAGNOSIS — E7800 Pure hypercholesterolemia, unspecified: Secondary | ICD-10-CM | POA: Diagnosis not present

## 2024-05-04 DIAGNOSIS — G4733 Obstructive sleep apnea (adult) (pediatric): Secondary | ICD-10-CM

## 2024-05-04 DIAGNOSIS — Z23 Encounter for immunization: Secondary | ICD-10-CM

## 2024-05-04 DIAGNOSIS — G8929 Other chronic pain: Secondary | ICD-10-CM

## 2024-05-04 DIAGNOSIS — M545 Low back pain, unspecified: Secondary | ICD-10-CM | POA: Diagnosis not present

## 2024-05-04 DIAGNOSIS — R1900 Intra-abdominal and pelvic swelling, mass and lump, unspecified site: Secondary | ICD-10-CM | POA: Diagnosis not present

## 2024-05-04 MED ORDER — ZEPBOUND 2.5 MG/0.5ML ~~LOC~~ SOAJ: 2.5000 mg | SUBCUTANEOUS | 2 refills | Status: AC

## 2024-05-04 NOTE — Patient Instructions (Signed)
 It was great to see you!  Look into transferring to [facility] at [facility]  We will try to order weekly weight loss shots (Zepbound ) for your obstructive sleep apnea  We will order MRI for your pelvic mass to be completed hopefully prior to meeting Dr Dallie later this month  Please call cardiology and follow up with them!  Take care,  Lucie Buttner PA-C

## 2024-05-04 NOTE — Progress Notes (Signed)
 Tara Collier is a 78 y.o. female here for a follow up of a pre-existing problem.  History of Present Illness:   Chief Complaint  Patient presents with   Hypertension    Discussed the use of AI scribe software for clinical note transcription with the patient, who gave verbal consent to proceed.  History of Present Illness Tara Collier is a 78 year old female with pelvic pain and vaginal bleeding who presents with worsening symptoms. She is accompanied by her daughter.  She has experienced pelvic pain and vaginal bleeding for the past year, with episodes increasing in frequency from every four to six months to monthly. The last episode was three weeks ago. She went to the ER and had vaginal ultrasound which showed thickened endometrium. The pain is stabbing, located in the lower abdomen and back, severe enough to prevent prolonged standing, and subsides after bright red vaginal bleeding without clots.  Two years ago, a biopsy and MRI were inconclusive, showing a mass and thickening in the cervical canal. She was lost to follow up. Thankfully she has upcoming appointment with gynecology later this month.  Currently taking losartan  100 mg daily, carvedilol  12.5 mg twice daily, lasix  20 mg daily. At home blood pressure readings are: not regularly checked. Patient denies chest pain, SOB, blurred vision, dizziness, unusual headaches, lower leg swelling. Patient is mostly compliant with medication. Denies excessive caffeine intake, stimulant usage, excessive alcohol intake, or increase in salt consumption.  She has hld and is taking atorvastatin  40 mg daily. Tolerating well but does confirm significant low back pain.       Past Medical History:  Diagnosis Date   Abnormal uterine bleeding    Arthritis    Atrial fibrillation (HCC) 06/03/2013   Last Assessment & Plan:  Formatting of this note might be different from the original. Continue propafenone for rhythm control .Patient has clear  symptomatic benefit with sinus rhythm maintenance. CHADS-Vasc score over 2, continue anticoagulation with eliquis .   HTN (hypertension) 06/03/2013   Last Assessment & Plan:  Formatting of this note might be different from the original. Hypertension is well controlled.  Continue current medical regimen and low sodium diet.  Reevaluate at next office visit   Hypercholesterolemia    Restless leg syndrome    Urinary incontinence 08/08/2020     Social History   Tobacco Use   Smoking status: Never   Smokeless tobacco: Never  Vaping Use   Vaping status: Never Used  Substance Use Topics   Alcohol use: No   Drug use: No    Past Surgical History:  Procedure Laterality Date   CARDIOVERSION     COLONOSCOPY WITH PROPOFOL  N/A 02/20/2022   Procedure: COLONOSCOPY WITH PROPOFOL ;  Surgeon: Abran Norleen SAILOR, MD;  Location: [facility] ENDOSCOPY;  Service: Gastroenterology;  Laterality: N/A;   ESOPHAGOGASTRODUODENOSCOPY (EGD) WITH PROPOFOL  N/A 02/20/2022   Procedure: ESOPHAGOGASTRODUODENOSCOPY (EGD) WITH PROPOFOL ;  Surgeon: Abran Norleen SAILOR, MD;  Location: [facility] ENDOSCOPY;  Service: Gastroenterology;  Laterality: N/A;   HOT HEMOSTASIS N/A 02/20/2022   Procedure: HOT HEMOSTASIS (ARGON PLASMA COAGULATION/BICAP);  Surgeon: Abran Norleen SAILOR, MD;  Location: [facility] ENDOSCOPY;  Service: Gastroenterology;  Laterality: N/A;   RADIOFREQUENCY ABLATION     ROTATOR CUFF REPAIR     TUBAL LIGATION      Family History  Problem Relation Age of Onset   Heart disease Mother    Appendicitis Mother    Appendicitis Father    Heart disease Father    Heart disease Maternal Grandmother  Allergies  Allergen Reactions   Tape Rash    USE PAPER TAPE ONLY!!!   Bee Venom Rash   Norvasc  [Amlodipine ] Swelling    Current Medications:   Current Outpatient Medications:    acetaminophen  (TYLENOL ) 325 MG tablet, Take 650 mg by mouth every 6 (six) hours as needed for moderate pain or headache., Disp: , Rfl:    acetaminophen  (TYLENOL ) 500 MG  tablet, Take 500 mg by mouth every 6 (six) hours as needed for mild pain or moderate pain., Disp: , Rfl:    apixaban  (ELIQUIS ) 5 MG TABS tablet, Take 1 tablet (5 mg total) by mouth 2 (two) times daily., Disp: 180 tablet, Rfl: 1   Ascorbic Acid (VITAMIN C) 1000 MG tablet, Take 1,000 mg by mouth daily., Disp: , Rfl:    atorvastatin  (LIPITOR) 40 MG tablet, TAKE 1 TABLET (40 MG TOTAL) BY MOUTH DAILY., Disp: 30 tablet, Rfl: 0   betamethasone  dipropionate 0.05 % cream, Apply topically 2 (two) times daily., Disp: 45 g, Rfl: 0   carvedilol  (COREG ) 12.5 MG tablet, Take 1 tablet (12.5 mg total) by mouth 2 (two) times daily with a meal., Disp: 180 tablet, Rfl: 2   diclofenac  Sodium (VOLTAREN ) 1 % GEL, APPLY 2 GRAMS TOPICALLY 4 (FOUR) TIMES DAILY AS NEEDED (KNEE PAIN)., Disp: 100 g, Rfl: 3   ferrous sulfate  325 (65 FE) MG tablet, Take 1 tablet (325 mg total) by mouth daily. (Patient taking differently: Take 325 mg by mouth every other day.), Disp: 30 tablet, Rfl: 1   furosemide  (LASIX ) 20 MG tablet, Take 1 tablet (20 mg total) by mouth daily., Disp: 90 tablet, Rfl: 3   gabapentin  (NEURONTIN ) 100 MG capsule, TAKE 2 CAPSULES BY MOUTH AT BEDTIME., Disp: 60 capsule, Rfl: 2   losartan  (COZAAR ) 100 MG tablet, Take 1 tablet (100 mg total) by mouth daily., Disp: 90 tablet, Rfl: 2   meclizine  (ANTIVERT ) 25 MG tablet, Take 1 tablet (25 mg total) by mouth 3 (three) times daily as needed for dizziness., Disp: 30 tablet, Rfl: 3   oxyCODONE -acetaminophen  (PERCOCET) 5-325 MG tablet, Take 1-2 tablets by mouth every 6 (six) hours as needed., Disp: 10 tablet, Rfl: 0   polyethylene glycol powder (GLYCOLAX /MIRALAX ) 17 GM/SCOOP powder, Take 17 g by mouth daily as needed for mild constipation., Disp: 255 g, Rfl: 1   senna (SENOKOT) 8.6 MG TABS tablet, Take 2 tablets by mouth at bedtime as needed for mild constipation., Disp: , Rfl:    Review of Systems:   Negative unless otherwise specified per HPI.  Vitals:   Vitals:    05/04/24 1543  BP: (!) 140/80  Pulse: (!) 58  Temp: (!) 97.3 F (36.3 C)  TempSrc: Temporal  SpO2: 98%  Weight: 249 lb (112.9 kg)  Height: 5' 2 (1.575 m)     Body mass index is 45.54 kg/m.  Physical Exam:   Physical Exam Vitals and nursing note reviewed.  Constitutional:      General: She is not in acute distress.    Appearance: Normal appearance. She is well-developed. She is not ill-appearing or toxic-appearing.  HENT:     Head: Normocephalic and atraumatic.  Eyes:     General: Lids are normal.     Extraocular Movements: Extraocular movements intact.     Conjunctiva/sclera: Conjunctivae normal.  Cardiovascular:     Rate and Rhythm: Normal rate and regular rhythm.     Pulses: Normal pulses.     Heart sounds: Normal heart sounds, S1 normal and S2 normal.  Pulmonary:     Effort: Pulmonary effort is normal.     Breath sounds: Normal breath sounds.  Musculoskeletal:        General: Normal range of motion.     Cervical back: Normal range of motion and neck supple.     Comments: No decreased ROM 2/2 pain with flexion/extension, lateral side bends, or rotation. No reproducible tenderness with deep palpation to bilateral paraspinal muscles. No bony tenderness. No evidence of erythema, rash or ecchymosis.    Skin:    General: Skin is warm and dry.  Neurological:     Mental Status: She is alert and oriented to person, place, and time.     GCS: GCS eye subscore is 4. GCS verbal subscore is 5. GCS motor subscore is 6.  Psychiatric:        Attention and Perception: Attention and perception normal.        Mood and Affect: Mood normal.        Speech: Speech normal.        Behavior: Behavior normal. Behavior is cooperative.        Thought Content: Thought content normal.        Judgment: Judgment normal.     Assessment and Plan:   Assessment and Plan Assessment & Plan Post menopausal bleeding ; Pelvic mass  Intermittent bright red vaginal bleeding with pelvic pain. Pelvic  mass and thickened endometrium noted on imaging. Previous biopsy inconclusive. Hemoglobin decreased, indicating possible chronic blood loss. - Order pelvic MRI before appointment with Dr. Dallie on September 30th. - Coordinate with Dr. Dallie for potential repeat biopsy.  Low back pain Possibly related to pelvic mass or degenerating fibroid. Pain exacerbated by standing, relieved by sitting. Differential includes musculoskeletal pain or referred pain from pelvic pathology. - Order pelvic MRI to assess for changes in pelvic mass. - Offered lumbar xray, she declined today  Primary hypertension Elevated blood pressure, possibly exacerbated by stress or noncompliance with medication. Recent ER visit noted significantly elevated blood pressure. - Monitor blood pressure at home and report readings. - Ensure compliance with current antihypertensive regimen.  Severe obstructive sleep apnea Severe obstructive sleep apnea, potentially contributing to fatigue and daytime somnolence. - Discuss potential weight loss interventions, including Zepbound , to improve sleep apnea symptoms.  Pure hypercholesterolemia Chronic hypercholesterolemia managed with atorvastatin . Possible contribution to musculoskeletal pain. - Recheck cholesterol panel. - Consider temporary discontinuation of atorvastatin  if musculoskeletal pain persists.  Obesity Obesity potentially contributing to multiple comorbidities, including hypertension, sleep apnea, and joint pain. Recent weight gain noted. - Discuss weight loss options, including Zepbound , pending insurance coverage. - Encourage regular physical activity as tolerated.  Follow-Up Follow-up plans discussed for ongoing management of multiple conditions. - Schedule follow-up with Dr. Dallie on September 30th. - Order pelvic MRI before Dr. Dallie appointment. - Recheck cholesterol panel. - Coordinate with cardiology for follow-up appointment.   I personally spent a total  of 71 minutes in the care of the patient today including preparing to see the patient, getting/reviewing separately obtained history, performing a medically appropriate exam/evaluation, counseling and educating, placing orders, documenting clinical information in the EHR, and independently interpreting results.   Lucie Buttner, PA-C

## 2024-05-05 ENCOUNTER — Ambulatory Visit: Payer: Self-pay | Admitting: Physician Assistant

## 2024-05-05 LAB — CBC WITH DIFFERENTIAL/PLATELET
Basophils Absolute: 0.1 K/uL (ref 0.0–0.1)
Basophils Relative: 0.9 % (ref 0.0–3.0)
Eosinophils Absolute: 0.2 K/uL (ref 0.0–0.7)
Eosinophils Relative: 3 % (ref 0.0–5.0)
HCT: 36.6 % (ref 36.0–46.0)
Hemoglobin: 12.3 g/dL (ref 12.0–15.0)
Lymphocytes Relative: 17.7 % (ref 12.0–46.0)
Lymphs Abs: 1 K/uL (ref 0.7–4.0)
MCHC: 33.6 g/dL (ref 30.0–36.0)
MCV: 90.3 fl (ref 78.0–100.0)
Monocytes Absolute: 0.5 K/uL (ref 0.1–1.0)
Monocytes Relative: 8 % (ref 3.0–12.0)
Neutro Abs: 4.1 K/uL (ref 1.4–7.7)
Neutrophils Relative %: 70.4 % (ref 43.0–77.0)
Platelets: 188 K/uL (ref 150.0–400.0)
RBC: 4.06 Mil/uL (ref 3.87–5.11)
RDW: 14.7 % (ref 11.5–15.5)
WBC: 5.9 K/uL (ref 4.0–10.5)

## 2024-05-05 LAB — COMPREHENSIVE METABOLIC PANEL WITH GFR
ALT: 16 U/L (ref 0–35)
AST: 23 U/L (ref 0–37)
Albumin: 4.1 g/dL (ref 3.5–5.2)
Alkaline Phosphatase: 89 U/L (ref 39–117)
BUN: 19 mg/dL (ref 6–23)
CO2: 30 meq/L (ref 19–32)
Calcium: 9.3 mg/dL (ref 8.4–10.5)
Chloride: 104 meq/L (ref 96–112)
Creatinine, Ser: 0.87 mg/dL (ref 0.40–1.20)
GFR: 64.52 mL/min (ref >60.00)
Glucose, Bld: 92 mg/dL (ref 70–99)
Potassium: 4.5 meq/L (ref 3.5–5.1)
Sodium: 142 meq/L (ref 135–145)
Total Bilirubin: 1.3 mg/dL — ABNORMAL HIGH (ref 0.2–1.2)
Total Protein: 6.8 g/dL (ref 6.0–8.3)

## 2024-05-05 LAB — IBC + FERRITIN
Ferritin: 69.7 ng/mL (ref 10.0–291.0)
Iron: 64 ug/dL (ref 42–145)
Saturation Ratios: 18.7 % — ABNORMAL LOW (ref 20.0–50.0)
TIBC: 343 ug/dL (ref 250.0–450.0)
Transferrin: 245 mg/dL (ref 212.0–360.0)

## 2024-05-05 LAB — LIPID PANEL
Cholesterol: 170 mg/dL (ref 0–200)
HDL: 39.7 mg/dL (ref >39.00)
LDL Cholesterol: 105 mg/dL — ABNORMAL HIGH (ref 0–99)
NonHDL: 130.77
Total CHOL/HDL Ratio: 4
Triglycerides: 128 mg/dL (ref 0.0–149.0)
VLDL: 25.6 mg/dL (ref 0.0–40.0)

## 2024-05-08 ENCOUNTER — Other Ambulatory Visit: Payer: Self-pay | Admitting: Physician Assistant

## 2024-05-08 ENCOUNTER — Other Ambulatory Visit: Payer: Self-pay | Admitting: Internal Medicine

## 2024-05-08 DIAGNOSIS — I4819 Other persistent atrial fibrillation: Secondary | ICD-10-CM

## 2024-05-13 ENCOUNTER — Other Ambulatory Visit (HOSPITAL_COMMUNITY): Payer: Self-pay

## 2024-05-13 ENCOUNTER — Telehealth: Payer: Self-pay

## 2024-05-13 NOTE — Telephone Encounter (Addendum)
 Pharmacy Patient Advocate Encounter   Received notification from Onbase that prior authorization for Zepbound  2.5MG /0.5ML pen-injectors is required/requested.   Insurance verification completed.   The patient is insured through [employer] .   Per test claim: [employer]'s Medicare formulary excludes coverage for weight loss drugs, but they may cover it  for moderate to severe obstructive sleep apnea (OSA)Sleep study will be required.

## 2024-05-16 ENCOUNTER — Other Ambulatory Visit (HOSPITAL_COMMUNITY): Payer: Self-pay

## 2024-05-16 NOTE — Telephone Encounter (Signed)
 Samantha, PA for Zepbound  was denied. See message, does pt have OSA?

## 2024-05-16 NOTE — Telephone Encounter (Addendum)
 Pharmacy Patient Advocate Encounter  Received notification from [employer] that Prior Authorization for Zepbound  2.5MG /0.5ML pen-injectors  has been APPROVED from 08/26/23 to 08/24/24   PA #/Case ID/Reference #: [id number]

## 2024-05-16 NOTE — Telephone Encounter (Signed)
 Bernice, please see Lucie message and resubmit PA for Zepbound . Thanks

## 2024-05-16 NOTE — Telephone Encounter (Signed)
 Tara Collier, is the approved dates correct?

## 2024-05-19 ENCOUNTER — Other Ambulatory Visit: Payer: Self-pay | Admitting: Internal Medicine

## 2024-05-19 NOTE — Telephone Encounter (Signed)
 Pt has made an appt for 06/15/24. Please refill up to appt time

## 2024-05-19 NOTE — Telephone Encounter (Signed)
 Prescription refill request for Eliquis  received. Indication: A-Fib Last office visit: 04/07/2023 Scr: 0.87  labs 05/04/2024 Age:  77 yrs Weight: 107.7 kg  Next office visit 06/15/2024 Lum Louis, NP, enough medication sent to pt's pharmacy to get pt to her appointment.

## 2024-05-21 ENCOUNTER — Ambulatory Visit (HOSPITAL_COMMUNITY)
Admission: RE | Admit: 2024-05-21 | Discharge: 2024-05-21 | Disposition: A | Discharge status: Home / Self Care | Source: Ambulatory Visit | Attending: Physician Assistant | Admitting: Physician Assistant

## 2024-05-21 ENCOUNTER — Encounter (HOSPITAL_COMMUNITY): Payer: Self-pay

## 2024-05-21 DIAGNOSIS — N888 Other specified noninflammatory disorders of cervix uteri: Secondary | ICD-10-CM | POA: Diagnosis not present

## 2024-05-21 DIAGNOSIS — R1900 Intra-abdominal and pelvic swelling, mass and lump, unspecified site: Secondary | ICD-10-CM | POA: Diagnosis not present

## 2024-05-21 MED ORDER — GADOBUTROL 1 MMOL/ML IV SOLN
10.0000 mL | Freq: Once | INTRAVENOUS | Status: AC | PRN
Start: 2024-05-21 — End: 2024-05-21
  Administered 2024-05-21: 10 mL via INTRAVENOUS

## 2024-05-23 NOTE — Progress Notes (Signed)
 78 y.o. [id number] female with known thickened endometrium and fibroids here for PMB. Single.  No LMP recorded. Patient is postmenopausal.   She reports cramping and abnormal bleeding. Symptoms have been present for >81yr, however RLQ cramping is recent  Attempted EMB in 2023 was nondiagnostic for PMB, no f/u since Recent MRI And TVUS with fibroids and thickened endometrium  04/26/24 TVUS: Uterus   Measurements: 7.9 x 2.5 x 3.4 cm = volume: 35 mL. There is a 2.9 x 3.3 x 3.5 cm calcified fibroid.   Endometrium   Thickness: 6 mm. The endometrium is poorly visualized and suboptimally evaluated. The endometrium is however thickened for a post menopausal female. In the setting of vaginal bleeding, findings may represent endometrial polyp, hyperplasia, or neoplasm. Further evaluation with hysteroscopy is recommended.   Right ovary: Not visualized.   Left ovary: Not visualized.   IMPRESSION: 1. Calcified fibroid. 2. Thickened endometrium. Further evaluation with hysteroscopy is recommended. 3. Nonvisualization of the ovaries.  05/21/24 MR pelvis: IMPRESSION: 1. Unchanged heterogeneous mass in the left aspect of the anterior uterine fundus, including some internal hemorrhagic or proteinaceous signal as well as dense peripheral calcific susceptibility artifact, measuring 3.9 x 3.2 cm. This is most consistent with a benign, calcified and degenerative fibroid and requires no further follow-up given imaging stability. 2. No acute findings in the pelvis.       Component Value Date/Time   DIAGPAP  04/07/2022 1225    - Negative for intraepithelial lesion or malignancy (NILM)   HPVHIGH Negative 04/07/2022 1225   ADEQPAP  04/07/2022 1225    Satisfactory for evaluation; transformation zone component PRESENT.   GYN HISTORY: As noted  OB History  Gravida Para Term Preterm AB Living  3 2   1 2   SAB IAB Ectopic Multiple Live Births  1        # Outcome Date GA Lbr Len/2nd Weight Sex  Type Anes PTL Lv  3 SAB           2 Para           1 Para            Past Medical History:  Diagnosis Date   Abnormal uterine bleeding    Arthritis    Atrial fibrillation (HCC) 06/03/2013   Last Assessment & Plan:  Formatting of this note might be different from the original. Continue propafenone for rhythm control .Patient has clear symptomatic benefit with sinus rhythm maintenance. CHADS-Vasc score over 2, continue anticoagulation with eliquis .   HTN (hypertension) 06/03/2013   Last Assessment & Plan:  Formatting of this note might be different from the original. Hypertension is well controlled.  Continue current medical regimen and low sodium diet.  Reevaluate at next office visit   Hypercholesterolemia    Restless leg syndrome    Urinary incontinence 08/08/2020   Past Surgical History:  Procedure Laterality Date   CARDIOVERSION     COLONOSCOPY WITH PROPOFOL  N/A 02/20/2022   Procedure: COLONOSCOPY WITH PROPOFOL ;  Surgeon: Abran Norleen SAILOR, MD;  Location: [facility] ENDOSCOPY;  Service: Gastroenterology;  Laterality: N/A;   ESOPHAGOGASTRODUODENOSCOPY (EGD) WITH PROPOFOL  N/A 02/20/2022   Procedure: ESOPHAGOGASTRODUODENOSCOPY (EGD) WITH PROPOFOL ;  Surgeon: Abran Norleen SAILOR, MD;  Location: [facility] ENDOSCOPY;  Service: Gastroenterology;  Laterality: N/A;   HOT HEMOSTASIS N/A 02/20/2022   Procedure: HOT HEMOSTASIS (ARGON PLASMA COAGULATION/BICAP);  Surgeon: Abran Norleen SAILOR, MD;  Location: [facility] ENDOSCOPY;  Service: Gastroenterology;  Laterality: N/A;   RADIOFREQUENCY ABLATION     ROTATOR  CUFF REPAIR     TUBAL LIGATION     Current Outpatient Medications on File Prior to Visit  Medication Sig Dispense Refill   acetaminophen  (TYLENOL ) 325 MG tablet Take 650 mg by mouth every 6 (six) hours as needed for moderate pain or headache.     acetaminophen  (TYLENOL ) 500 MG tablet Take 500 mg by mouth every 6 (six) hours as needed for mild pain or moderate pain.     apixaban  (ELIQUIS ) 5 MG TABS tablet TAKE 1 TABLET BY  MOUTH TWICE A DAY 60 tablet 0   Ascorbic Acid (VITAMIN C) 1000 MG tablet Take 1,000 mg by mouth daily.     atorvastatin  (LIPITOR) 40 MG tablet TAKE 1 TABLET (40 MG TOTAL) BY MOUTH DAILY. 30 tablet 0   betamethasone  dipropionate 0.05 % cream Apply topically 2 (two) times daily. 45 g 0   carvedilol  (COREG ) 12.5 MG tablet Take 1 tablet (12.5 mg total) by mouth 2 (two) times daily with a meal. 180 tablet 2   diclofenac  Sodium (VOLTAREN ) 1 % GEL APPLY 2 GRAMS TOPICALLY 4 (FOUR) TIMES DAILY AS NEEDED (KNEE PAIN). 100 g 3   ferrous sulfate  325 (65 FE) MG tablet Take 1 tablet (325 mg total) by mouth daily. (Patient taking differently: Take 325 mg by mouth every other day.) 30 tablet 1   furosemide  (LASIX ) 20 MG tablet Take 1 tablet (20 mg total) by mouth daily. 90 tablet 3   gabapentin  (NEURONTIN ) 100 MG capsule TAKE 2 CAPSULES BY MOUTH AT BEDTIME. 60 capsule 2   losartan  (COZAAR ) 100 MG tablet Take 1 tablet (100 mg total) by mouth daily. 90 tablet 2   meclizine  (ANTIVERT ) 25 MG tablet Take 1 tablet (25 mg total) by mouth 3 (three) times daily as needed for dizziness. 30 tablet 3   polyethylene glycol powder (GLYCOLAX /MIRALAX ) 17 GM/SCOOP powder Take 17 g by mouth daily as needed for mild constipation. 255 g 1   senna (SENOKOT) 8.6 MG TABS tablet Take 2 tablets by mouth at bedtime as needed for mild constipation.     tirzepatide  (ZEPBOUND ) 2.5 MG/0.5ML Pen Inject 2.5 mg into the skin once a week. 2 mL 2   oxyCODONE -acetaminophen  (PERCOCET) 5-325 MG tablet Take 1-2 tablets by mouth every 6 (six) hours as needed. (Patient not taking: Reported on 05/24/2024) 10 tablet 0   No current facility-administered medications on file prior to visit.   Allergies  Allergen Reactions   Tape Rash    USE PAPER TAPE ONLY!!!   Bee Venom Rash   Norvasc  [Amlodipine ] Swelling      PE Today's Vitals   05/24/24 1622  BP: 110/72  Pulse: (!) 40  Temp: 97.6 F (36.4 C)  TempSrc: Oral  SpO2: 97%  Weight: 252 lb (114.3  kg)   Body mass index is 46.09 kg/m.  Physical Exam Vitals reviewed. Exam conducted with a chaperone present.  Constitutional:      General: She is not in acute distress.    Appearance: Normal appearance.  HENT:     Head: Normocephalic and atraumatic.     Nose: Nose normal.  Eyes:     Extraocular Movements: Extraocular movements intact.     Conjunctiva/sclera: Conjunctivae normal.  Pulmonary:     Effort: Pulmonary effort is normal.  Genitourinary:    General: Normal vulva.     Exam position: Lithotomy position.     Vagina: Normal. No vaginal discharge.     Cervix: Normal. No cervical motion tenderness, discharge or lesion.  Uterus: Normal. Not enlarged and not tender.      Adnexa: Right adnexa normal and left adnexa normal.  Musculoskeletal:        General: Normal range of motion.     Cervical back: Normal range of motion.  Neurological:     General: No focal deficit present.     Mental Status: She is alert.  Psychiatric:        Mood and Affect: Mood normal.        Behavior: Behavior normal.     Procedure Consent was signed. Timeout was performed. Speculum inserted into the vagina, cervix visualized and was prepped with Betadine.  Cervical block was not performed. A single-toothed tenaculum was placed on the anterior lip of the cervix to stabilize it.  The 3 mm pipelle was introduced into the endometrial cavity without difficulty to a depth of 7cm, suction initiated and a minimal amount of tissue was obtained over 2 passes and sent to pathology.  The instruments were removed from the patient's vagina.  Minimal bleeding from the cervix was noted.  The patient tolerated the procedure well.     Assessment and Plan:        Genitourinary bleeding -     Surgical pathology  Thickened endometrium -     Surgical pathology  Reviewed causes of PMB, including genitourinary syndrome of menopause, infection, trauma, polyps, urinary and GI etiologies, and malignancy.  Recommend endometrial sampling- via EMB or diagnostic hysteroscopy. Patient elects for EMB. However is nondiagnostic will need to proceed with hysteroscopy.  Low back pain, unspecified back pain laterality, unspecified chronicity, unspecified whether sciatica present Nephrolithiasis, uric acid -     Urinalysis,Complete w/RFL Culture -     Phenazopyridine HCl; Take 1 tablet (200 mg total) by mouth 3 (three) times daily as needed for up to 2 days for pain.  Dispense: 6 tablet; Refill: 0  Start pyridium. Recommend f/u with Dr. MacDiarmid, urologist Flomax not sent due to bradycardia and interaction with HTN meds  Prentiss Hammett LULLA Pa, MD

## 2024-05-24 ENCOUNTER — Ambulatory Visit: Admitting: Obstetrics and Gynecology

## 2024-05-24 ENCOUNTER — Encounter: Payer: Self-pay | Admitting: Obstetrics and Gynecology

## 2024-05-24 ENCOUNTER — Other Ambulatory Visit (HOSPITAL_COMMUNITY)
Admission: RE | Admit: 2024-05-24 | Discharge: 2024-05-24 | Disposition: A | Discharge status: Home / Self Care | Source: Ambulatory Visit | Attending: Obstetrics and Gynecology | Admitting: Obstetrics and Gynecology

## 2024-05-24 VITALS — BP 110/72 | HR 40 | Temp 97.6°F | Wt 252.0 lb

## 2024-05-24 DIAGNOSIS — R8290 Unspecified abnormal findings in urine: Secondary | ICD-10-CM | POA: Diagnosis not present

## 2024-05-24 DIAGNOSIS — N200 Calculus of kidney: Secondary | ICD-10-CM | POA: Diagnosis not present

## 2024-05-24 DIAGNOSIS — M545 Low back pain, unspecified: Secondary | ICD-10-CM | POA: Diagnosis not present

## 2024-05-24 DIAGNOSIS — R319 Hematuria, unspecified: Secondary | ICD-10-CM | POA: Insufficient documentation

## 2024-05-24 DIAGNOSIS — R9389 Abnormal findings on diagnostic imaging of other specified body structures: Secondary | ICD-10-CM | POA: Insufficient documentation

## 2024-05-24 MED ORDER — PHENAZOPYRIDINE HCL 200 MG PO TABS: 200.0000 mg | ORAL_TABLET | Freq: Three times a day (TID) | ORAL | 0 refills | Status: DC | PRN

## 2024-05-26 ENCOUNTER — Telehealth: Payer: Self-pay

## 2024-05-26 LAB — URINE CULTURE
MICRO NUMBER:: 17035899
Result:: NO GROWTH
SPECIMEN QUALITY:: ADEQUATE

## 2024-05-26 LAB — URINALYSIS, COMPLETE W/RFL CULTURE
Bilirubin Urine: NEGATIVE
Glucose, UA: NEGATIVE
Hyaline Cast: NONE SEEN /LPF
Ketones, ur: NEGATIVE
Leukocyte Esterase: NEGATIVE
Nitrites, Initial: NEGATIVE
Specific Gravity, Urine: 1.02 (ref 1.001–1.035)
pH: 5.5 (ref 5.0–8.0)

## 2024-05-26 LAB — SURGICAL PATHOLOGY

## 2024-05-26 LAB — CULTURE INDICATED

## 2024-05-26 NOTE — Telephone Encounter (Signed)
 Patients daughter calling inquiring about her mothers follow up for her kidney stone. She states she came for an appointment & was waiting to hear if her mom could start the flomax for the kidney stone. She said Dr Dallie was going to talk to her other provider to see if it was safe for her to take this. She did call the urologist & is waiting on an appointment but wanted to know what she could take.Please advise.

## 2024-05-27 NOTE — Telephone Encounter (Signed)
 LM for pt's daughter Glenys at [phone], asking her to call the office back.

## 2024-06-01 ENCOUNTER — Telehealth: Payer: Self-pay | Admitting: *Deleted

## 2024-06-01 ENCOUNTER — Ambulatory Visit: Payer: Self-pay | Admitting: Obstetrics and Gynecology

## 2024-06-01 ENCOUNTER — Telehealth: Payer: Self-pay | Admitting: Internal Medicine

## 2024-06-01 ENCOUNTER — Other Ambulatory Visit: Payer: Self-pay | Admitting: Internal Medicine

## 2024-06-01 DIAGNOSIS — R9389 Abnormal findings on diagnostic imaging of other specified body structures: Secondary | ICD-10-CM

## 2024-06-01 DIAGNOSIS — R319 Hematuria, unspecified: Secondary | ICD-10-CM

## 2024-06-01 MED ORDER — CARVEDILOL 12.5 MG PO TABS: 12.5000 mg | ORAL_TABLET | Freq: Two times a day (BID) | ORAL | 0 refills | Status: DC

## 2024-06-01 NOTE — Telephone Encounter (Signed)
 Spoke with patients daughter, Glenys, ok per dpr. Advised per Dr. Dallie, patient is scheduled to see urology. Tanya verbalizes understanding and is agreeable.   Encounter closed.

## 2024-06-01 NOTE — Telephone Encounter (Signed)
*  STAT* If patient is at the pharmacy, call can be transferred to refill team.   1. Which medications need to be refilled? (please list name of each medication and dose if known) carvedilol  (COREG ) 12.5 MG tablet   2. Which pharmacy/location (including street and city if local pharmacy) is medication to be sent to? [id number] [place],  - [address]   3. Do they need a 30 day or 90 day supply? 90

## 2024-06-01 NOTE — Telephone Encounter (Signed)
 Pt's medication was sent to pt's pharmacy as requested. Confirmation received.

## 2024-06-01 NOTE — Telephone Encounter (Signed)
 Please see message and advise

## 2024-06-01 NOTE — Telephone Encounter (Signed)
 Copied from CRM #[id number]. Topic: Clinical - Medical Advice >> May 31, 2024  4:30 PM Shereese L wrote: Reason for CRM: Patient has kidney stones and needs to know what the next steps are because she's not able to get in contact with the neurologist Patients daughter  is requesting a call back at the number listed below because her mother needs some type of treatment asap [phone]

## 2024-06-01 NOTE — Telephone Encounter (Signed)
 Spoke to pt's daughter Glenys told her calling about message.  I think you were saying Urology not Neurology. Glenys said yes Urology. Told her Lucie said she recommend that she continue to try to reach out to them. We can resubmit a referral if needed. If she is having any severe pain that needs to be addressed right away, she needs to go to the ER. Glenys said she did get in touch with them and pt is scheduled 10/16 and she said I am not going to the ED, last time we went and was their till 4:00 in the morning and was sent home. Glenys continued on about concern of Kidney stones and asked why she could not bring her here. Told her we do not treat kidney stones that is why she was sent to Urology. Tanya verbalized understanding and went on about having to see all the different providers Cardiology and Gyn. She said she does not have time for all these doctor appointments back and forth and just wants things taken care of and if pt needs to go to the ED she will take her if pain gets severe. She also said pt is still having SOB and ankle swelling since seen last. Asked her when is Cardiology appt? Tanya said 10/22. Told her okay if any increase in SOB and swelling need to go to the ED. Tanya verbalized understanding and said hopefully she will be okay till then. Glenys is just frustrated with all the provider visit and taking care of her mom while trying to work and take care of everything.

## 2024-06-02 ENCOUNTER — Telehealth: Payer: Self-pay | Admitting: *Deleted

## 2024-06-02 NOTE — Telephone Encounter (Signed)
 Orders placed.

## 2024-06-02 NOTE — Telephone Encounter (Signed)
   Pre-operative Risk Assessment    Patient Name: Tara Collier  DOB: [birth year] MRN: [id number]   Date of last office visit: 04/07/2023 Date of next office visit:06/15/24   Request for Surgical Clearance    Procedure:  HYSTEROSCOPY D&C  Date of Surgery:  Clearance TBD                                Surgeon:  DR. DALLIE Socks Group or Practice Name:  [facility] Phone number:  [phone] Fax number:  [phone]   Type of Clearance Requested:   - Medical  - Pharmacy:  Hold Apixaban  (Eliquis ) NOT INDICATED   Type of Anesthesia:  MAC   Additional requests/questions:    SignedApolinar Essex   06/02/2024, 10:47 AM

## 2024-06-06 NOTE — Telephone Encounter (Signed)
 Patient with diagnosis of A Fib on Eliquis  for anticoagulation. Overdue for OV.  Procedure: HYSTEROSCOPY D&C  Date of procedure: TBD   CHA2DS2-VASc Score = 5  This indicates a 7.2% annual risk of stroke. The patient's score is based upon: CHF History: 1 HTN History: 1 Diabetes History: 0 Stroke History: 0 Vascular Disease History: 0 Age Score: 2 Gender Score: 1     CrCl 99 ml/min Platelet count 188K  Patient has not had an Afib/aflutter ablation in the last 3 months, DCCV within the last 4 weeks or a watchman implanted in the last 45 days    Per office protocol, patient can hold Eliquis  for 2 days prior to procedure.    **This guidance is not considered finalized until pre-operative APP has relayed final recommendations.**

## 2024-06-08 NOTE — Telephone Encounter (Signed)
   Name: Derrisha Foos  DOB: Jul 30, 1946  MRN: [id number]  Primary Cardiologist: Soyla DELENA Merck, MD  Chart reviewed as part of pre-operative protocol coverage. The patient has an upcoming visit scheduled with Lum Louis, NP on 06/15/2024 at which time clearance can be addressed in case there are any issues that would impact surgical recommendations.  HYSTEROSCOPY D&C Is not scheduled until TBD as below. I added preop FYI to appointment note so that provider is aware to address at time of outpatient visit.  Per office protocol the cardiology provider should forward their finalized clearance decision and recommendations regarding antiplatelet therapy to the requesting party below.    Per office protocol, patient can hold Eliquis  for 2 days prior to procedure.    I will route this message as FYI to requesting party and remove this message from the preop box as separate preop APP input not needed at this time.   Please call with any questions.  Lamarr Satterfield, NP  06/08/2024, 3:55 PM

## 2024-06-09 DIAGNOSIS — N3946 Mixed incontinence: Secondary | ICD-10-CM | POA: Diagnosis not present

## 2024-06-14 ENCOUNTER — Encounter: Payer: Self-pay | Admitting: *Deleted

## 2024-06-15 ENCOUNTER — Ambulatory Visit: Attending: Emergency Medicine | Admitting: Emergency Medicine

## 2024-06-15 VITALS — BP 164/78 | HR 61 | Ht 63.0 in | Wt 251.0 lb

## 2024-06-15 DIAGNOSIS — I10 Essential (primary) hypertension: Secondary | ICD-10-CM

## 2024-06-15 DIAGNOSIS — R0609 Other forms of dyspnea: Secondary | ICD-10-CM

## 2024-06-15 DIAGNOSIS — I4819 Other persistent atrial fibrillation: Secondary | ICD-10-CM

## 2024-06-15 DIAGNOSIS — G4733 Obstructive sleep apnea (adult) (pediatric): Secondary | ICD-10-CM | POA: Diagnosis not present

## 2024-06-15 DIAGNOSIS — Z01810 Encounter for preprocedural cardiovascular examination: Secondary | ICD-10-CM | POA: Diagnosis not present

## 2024-06-15 MED ORDER — OLMESARTAN MEDOXOMIL 40 MG PO TABS: 40.0000 mg | ORAL_TABLET | Freq: Every day | ORAL | 3 refills | Status: AC

## 2024-06-15 NOTE — Patient Instructions (Addendum)
 Medication Instructions:  STOP TAKING LOSARTAN . START TAKING OLMESARTAN 40 MG DAILY.  Lab Work: NONE TO BE DONE TODAY.  Testing/Procedures:   Your cardiac CT will be scheduled at one of the below locations:   [facility] [address] [place], [place] [address] [phone] (Severe contrast allergies only)  OR   [facility] [address]  [place], [place] [address]  If scheduled at [facility], please arrive at the [facility] and Children's Entrance (Entrance C2) of [facility] 30 minutes prior to test start time. You can use the FREE valet parking offered at entrance C (encouraged to control the heart rate for the test)  Proceed to the [facility] Radiology Department (first floor) to check-in and test prep.  All radiology patients and guests should use entrance C2 at [facility], accessed from [facility], even though the hospital's physical address listed is [address].  If scheduled at the Heart and Vascular Tower at [address], please enter the parking lot using the [address] entrance and use the FREE valet service at the patient drop-off area. Enter the building and check-in with registration on the main floor.  Please follow these instructions carefully (unless otherwise directed):  An IV will be required for this test and Nitroglycerin will be given.   On the Night Before the Test: Be sure to Drink plenty of water. Do not consume any caffeinated/decaffeinated beverages or chocolate 12 hours prior to your test. Do not take any antihistamines 12 hours prior to your test.  On the Day of the Test: Drink plenty of water until 1 hour prior to the test. Do not eat any food 1 hour prior to test. You may take your regular medications prior to the test. If you take Furosemide  please HOLD on the morning of the test. Patients who wear a continuous glucose monitor MUST remove  the device prior to scanning. FEMALES- please wear underwire-free bra if available, avoid dresses & tight clothing      After the Test: Drink plenty of water. After receiving IV contrast, you may experience a mild flushed feeling. This is normal. On occasion, you may experience a mild rash up to 24 hours after the test. This is not dangerous. If this occurs, you can take Benadryl 25 mg, Zyrtec, Claritin, or Allegra and increase your fluid intake. (Patients taking Tikosyn should avoid Benadryl, and may take Zyrtec, Claritin, or Allegra) If you experience trouble breathing, this can be serious. If it is severe call 911 IMMEDIATELY. If it is mild, please call our office.  We will call to schedule your test 2-4 weeks out understanding that some insurance companies will need an authorization prior to the service being performed.   For more information and frequently asked questions, please visit our website : [email or web address]  For non-scheduling related questions, please contact the cardiac imaging nurse navigator should you have any questions/concerns: Cardiac Imaging Nurse Navigators Direct Office Dial: [phone]   For scheduling needs, including cancellations and rescheduling, please call [place], [phone].   Your physician has requested that you have an echocardiogram. Echocardiography is a painless test that uses sound waves to create images of your heart. It provides your doctor with information about the size and shape of your heart and how well your heart's chambers and valves are working. This procedure takes approximately one hour. There are no restrictions for this procedure. Please do NOT wear cologne, perfume, aftershave, or  lotions (deodorant is allowed). Please arrive 15 minutes prior to your appointment time.  Please note: We ask at that you not bring children with you during ultrasound (echo/ vascular) testing. Due to room size and safety concerns, children  are not allowed in the ultrasound rooms during exams. Our front office staff cannot provide observation of children in our lobby area while testing is being conducted. An adult accompanying a patient to their appointment will only be allowed in the ultrasound room at the discretion of the ultrasound technician under special circumstances. We apologize for any inconvenience.   Follow-Up: At [facility], you and your health needs are our priority.  As part of our continuing mission to provide you with exceptional heart care, our providers are all part of one team.  This team includes your primary Cardiologist (physician) and Advanced Practice Providers or APPs (Physician Assistants and Nurse Practitioners) who all work together to provide you with the care you need, when you need it.  Your next appointment:   6 WEEKS NEEDS TO BE SCHEDULED TO SEE DR. CINDIE WITH EP.  Provider:   MADISON FOUNTAIN, NP   OTHER: PLEASE WEIGH YOURSELF AND TAKE YOUR HEART RATE AND BLOOD PRESSURE DAILY. RECORD ALL ON LOGS GIVEN AND BRING BACK TO YOUR NEXT OFFICE VISIT.

## 2024-06-15 NOTE — Progress Notes (Signed)
 Cardiology Office Note:    Date:  06/15/2024  ID:  Tara Collier, DOB [birth year], MRN [id number] PCP: Job Lukes, PA   [facility] Providers Cardiologist:  Soyla DELENA Merck, MD Cardiology APP:  Tara Lum CROME, NP       Patient Profile:       Chief Complaint: Preoperative clearance and routine follow-up History of Present Illness:  Tara Collier is a 78 y.o. female with visit-pertinent history of dyspnea on exertion, persistent atrial fibrillation s/p ablation 2013, hypertension, hyperlipidemia, obstructive sleep apnea Patient establish care in 08/2020 for atrial fibrillation.  At that time she reported dyspnea on exertion with wheezing.  A-fib history included ablation in 2013.  She underwent echocardiogram which showed normal LV/RV function, mildly elevated PA pressure, RAE, mild aortic valve sclerosis without stenosis.  Patient wore ZIO monitor for 7 hours which showed A-fib for at least 74% of the time.  Upon review sinus rhythm was not clear on monitor strips.  Patient was referred to atrial fibrillation clinic.  She underwent nuclear stress test which was normal low risk study in anticipation of starting propafenone.  She was seen by Dr. Cindie on 05/2022 discussed options for atrial fibrillation management.  Plan was to see if patient could remain in SR for any period of time to guide catheter ablation would be an option.  Discussed Tikosyn loading and DCCV and patient was interested in proceeding.  She underwent sleep study with Dr. Burnard and was found to have severe sleep apnea but not had followed up with CPAP titration.  She was last seen in clinic on 04/07/2023.  At that time she had no cardiac awareness of her arrhythmia.  EKG showed slow ventricular response with heart rate 58 bpm.  No medication changes were made.  She was to follow-up in 6 months.  She is now pending hysteroscopy D&C  Discussed the use of AI scribe software for clinical note transcription with the  patient, who gave verbal consent to proceed.  History of Present Illness Tara Collier is a 78 year old female with persistent atrial fibrillation who presents with worsening shortness of breath and uncontrolled hypertension.  She experiences worsening shortness of breath over the past few months, now limiting her ability to walk more than five to ten feet. Her weight has increased from 237 lbs to 251 lbs over the past year, and her shortness of breath worsens with exposure to triggers like perfume and hairspray.  She denies any exertional chest pains.  She does report adherence to medication regimen.  She has persistent atrial fibrillation and takes Eliquis  and carvedilol  twice daily. Her blood pressure remains high, with recent readings of 185/76. She denies missing doses but does not always take them at the same time each day.  She reports swelling in her legs and abdomen. She takes Lasix  (furosemide ) 20 mg as needed for swelling but has recently started taking it more regularly due to increased swelling.  She was only intermittently taking this prior due to urinary frequency.  Her diet is poor, with a preference for processed foods and snacks, though she tries to include fruits and yogurts. She acknowledges that her diet may contribute to fluid retention and overall health problems.  She denies orthopnea, PND, syncope, presyncope, lightheadedness, dizziness   Review of systems:  Please see the history of present illness. All other systems are reviewed and otherwise negative.      Studies Reviewed:    EKG Interpretation Date/Time:  Wednesday June 15 2024  15:26:47 EDT Ventricular Rate:  62 PR Interval:    QRS Duration:  90 QT Interval:  474 QTC Calculation: 481 R Axis:   -3  Text Interpretation: Atrial fibrillation When compared with ECG of 07-Apr-2023 09:06, Criteria for Septal infarct are no longer Present Confirmed by Tara Dixon [id number]) on 06/15/2024 4:21:53 PM     Echocardiogram 02/20/2022  1. Left ventricular ejection fraction, by estimation, is 60 to 65%. The  left ventricle has normal function. The left ventricle has no regional  wall motion abnormalities. Left ventricular diastolic function could not  be evaluated.   2. Right ventricular systolic function is normal. The right ventricular  size is normal.   3. Left atrial size was mildly dilated.   4. Right atrial size was mildly dilated.   5. The mitral valve is normal in structure. Trivial mitral valve  regurgitation. No evidence of mitral stenosis.   6. The aortic valve is tricuspid. Aortic valve regurgitation is not  visualized. Aortic valve sclerosis/calcification is present, without any  evidence of aortic stenosis. Aortic valve Vmax measures 1.15 m/s.   7. The inferior vena cava is normal in size with greater than 50%  respiratory variability, suggesting right atrial pressure of 3 mmHg.   Lexiscan  Myoview  10/30/2020 Nuclear stress EF: 63%. There was no ST segment deviation noted during stress. The study is normal. This is a low risk study. The left ventricular ejection fraction is normal (55-65%).   Normal stress nuclear study with no ischemia or infarction.  Gated ejection fraction 63% with normal wall motion.  ZIO 10/23/2021 Patient wore monitor for 7 hours, for which she was in Afib for at least 94% of time. I do not clearly see sinus rhythm on monitor strips.  Rate controlled atrial fibrillation.   Echocardiogram 10/05/2020  1. Left ventricular ejection fraction, by estimation, is 60 to 65%. The  left ventricle has normal function. The left ventricle has no regional  wall motion abnormalities. Left ventricular diastolic function could not  be evaluated.   2. Right ventricular systolic function is normal. The right ventricular  size is mildly enlarged. There is mildly elevated pulmonary artery  systolic pressure. The estimated right ventricular systolic pressure is  44.0 mmHg.    3. Right atrial size was moderately dilated.   4. The mitral valve is normal in structure. No evidence of mitral valve  regurgitation. No evidence of mitral stenosis.   5. The aortic valve is tricuspid. Aortic valve regurgitation is not  visualized. Mild aortic valve sclerosis is present, with no evidence of  aortic valve stenosis.   6. The inferior vena cava is normal in size with greater than 50%  respiratory variability, suggesting right atrial pressure of 3 mmHg.    Risk Assessment/Calculations:    CHA2DS2-VASc Score = 5   This indicates a 7.2% annual risk of stroke. The patient's score is based upon: CHF History: 1 HTN History: 1 Diabetes History: 0 Stroke History: 0 Vascular Disease History: 0 Age Score: 2 Gender Score: 1    HYPERTENSION CONTROL Vitals:   06/15/24 1534 06/15/24 1625  BP: (!) 185/76 (!) 164/78    The patient's blood pressure is elevated above target today.  In order to address the patient's elevated BP: A new medication was prescribed today.           Physical Exam:   VS:  BP (!) 164/78   Pulse 61   Ht 5' 3 (1.6 m)   Wt 251 lb (113.9 kg)  SpO2 96%   BMI 44.46 kg/m    Wt Readings from Last 3 Encounters:  06/15/24 251 lb (113.9 kg)  05/24/24 252 lb (114.3 kg)  05/04/24 249 lb (112.9 kg)    GEN: Well nourished, well developed in no acute distress NECK: No JVD; No carotid bruits CARDIAC: Irregular irregular rhythm, no murmurs, rubs, gallops RESPIRATORY:  Clear to auscultation without rales, wheezing or rhonchi  ABDOMEN: Soft, non-tender, non-distended EXTREMITIES: Mild bilateral pedal edema; No acute deformity      Assessment and Plan:  Persistent atrial fibrillation S/p ablation in 2013 ZIO 09/2020 (worn for 7 hours) showed atrial fibrillation at 94% Unclear duration of her atrial fibrillation but seems it has been persistent at least the last 3 years - EKG today shows rate controlled atrial fibrillation with HR 61 bpm - She is  currently cardiac unaware of her arrhythmia - Previously discussed with EP for consideration of Tikosyn admission or possibility of LAAO but she was lost to follow-up - I will have her schedule follow-up appointment back with EP to discuss further options as her ongoing dyspnea on exertion could be contributory.  At this time we will continue with rate control - Continue Eliquis  5 mg twice daily - Continue carvedilol  12.5 mg twice  Dyspnea on exertion This is longstanding.  Likely multifactorial and may be related to persistent atrial fibrillation, uncontrolled OSA, deconditioning, or OHS. -  However, she does report she has had worsening dyspnea on exertion over the past few months with associated weight gain and LEE.  She is without any exertional chest pains - She has been fairly noncompliant with her furosemide  due to urinary frequency however has restarted recently and remained adherent with improvement in LEE but not DOE.  No orthopnea, PND - PE shows lungs CTAB and mild pedal edema - Although her symptoms do not seem to be related to ischemia will order coronary CTA for further ischemic evaluation - Plan for coronary CTA for further ischemic evaluation - Plan for echocardiogram to reevaluate LV function and valvular abnormalities - Continue furosemide  20 mg daily  Hypertension Blood pressure today is 185/76 and repeat 164/78 and uncontrolled During her last several office visits her blood pressure has been fairly uncontrolled - Her severe untreated OSA is likely contributory - She is not interested in additional medications at this time.  Therefore I will discontinue losartan  100 mg daily and replace with olmesartan 40 mg daily - Continue carvedilol  12.5 mg twice daily - She will monitor BP at home with blood pressure log and send 2 weeks worth of blood pressure readings over MyChart.  If blood pressure remains elevated we will add amlodipine  at that time  Obstructive sleep  apnea History of severe obstructive sleep apnea with AHI 60.6/H on 05/2022 - CPAP titration was recommended however patient never started on CPAP therapy - I will reach out to our sleep medicine team today to see if we can get patient started on CPAP therapy  Obesity BMI 44.46 - She plans to start Zepbound  per PCP  Preoperative cardiovascular exam Hysteroscopy D&C  According to the Revised Cardiac Risk Index (RCRI), her Perioperative Risk of Major Cardiac Event is (%): 0.4. Her Functional Capacity in METs is: 4.4 according to the Duke Activity Status Index (DASI).   Per office protocol, patient can hold Eliquis  for 2 days prior to procedure.    Given patient's worsening DOE we will plan for coronary CTA for further ischemic evaluation prior to cardiac clearance.  Dispo:  Return in about 6 weeks (around 07/27/2024).  Signed, Lum LITTIE Louis, NP

## 2024-06-17 ENCOUNTER — Telehealth: Payer: Self-pay | Admitting: Internal Medicine

## 2024-06-17 DIAGNOSIS — I4819 Other persistent atrial fibrillation: Secondary | ICD-10-CM

## 2024-06-17 MED ORDER — APIXABAN 5 MG PO TABS: 5.0000 mg | ORAL_TABLET | Freq: Two times a day (BID) | ORAL | 1 refills | Status: AC

## 2024-06-17 NOTE — Telephone Encounter (Signed)
*  STAT* If patient is at the pharmacy, call can be transferred to refill team.   1. Which medications need to be refilled? (please list name of each medication and dose if known)   apixaban (ELIQUIS) 5 MG TABS tablet    2. Which pharmacy/location (including street and city if local pharmacy) is medication to be sent to?  [id number] - [place], [place] - [address]      3. Do they need a 30 day or 90 day supply? 90 day    Pt is out of medication

## 2024-06-17 NOTE — Telephone Encounter (Signed)
 Prescription refill request for Eliquis  received. Indication: A. FIB Last office visit: 06/15/24 Scr: 0.87 (EPIC 05/04/24) Age: 78 Weight: 113.9 kg  Per protocol okay to refill.

## 2024-06-17 NOTE — Telephone Encounter (Signed)
Eliquis Refill  

## 2024-06-27 ENCOUNTER — Telehealth: Payer: Self-pay

## 2024-06-27 ENCOUNTER — Ambulatory Visit: Admitting: Obstetrics and Gynecology

## 2024-06-27 NOTE — Telephone Encounter (Signed)
 Pt's daughter called in stating that pt's cardiologist has put any surgeries on hold, she wanted to know if her mom should keep her appt today at 230 for a pre-op or not?   Please advise

## 2024-06-27 NOTE — Telephone Encounter (Signed)
 Tara Collier is aware and the appt has been cancelled.

## 2024-07-01 ENCOUNTER — Encounter (HOSPITAL_COMMUNITY): Payer: Self-pay

## 2024-07-04 ENCOUNTER — Other Ambulatory Visit: Payer: Self-pay | Admitting: *Deleted

## 2024-07-04 ENCOUNTER — Telehealth: Payer: Self-pay | Admitting: *Deleted

## 2024-07-04 MED ORDER — AMLODIPINE BESYLATE 5 MG PO TABS: 5.0000 mg | ORAL_TABLET | Freq: Every day | ORAL | 3 refills | Status: AC

## 2024-07-04 NOTE — Telephone Encounter (Signed)
 Spoke to patient's daughter, Glenys Prescott and let her know that per the provider [facility], given that blood pressure remains above goal I recommend starting amlodipine  5 mg daily. Continue to monitor blood pressure at home and alert us  in 2 weeks via MyChart. Daughter understood everything and had no questions at this time. A new prescription for the Amlodipine  5 MG daily has been sent to patient's pharmacy.

## 2024-07-05 ENCOUNTER — Ambulatory Visit (HOSPITAL_COMMUNITY)
Admission: RE | Admit: 2024-07-05 | Discharge: 2024-07-05 | Disposition: A | Discharge status: Home / Self Care | Source: Ambulatory Visit | Attending: Cardiovascular Disease | Admitting: Cardiovascular Disease

## 2024-07-05 DIAGNOSIS — I10 Essential (primary) hypertension: Secondary | ICD-10-CM | POA: Diagnosis not present

## 2024-07-05 DIAGNOSIS — R931 Abnormal findings on diagnostic imaging of heart and coronary circulation: Secondary | ICD-10-CM | POA: Diagnosis present

## 2024-07-05 DIAGNOSIS — I2510 Atherosclerotic heart disease of native coronary artery without angina pectoris: Secondary | ICD-10-CM | POA: Diagnosis not present

## 2024-07-05 DIAGNOSIS — R0609 Other forms of dyspnea: Secondary | ICD-10-CM | POA: Insufficient documentation

## 2024-07-05 MED ORDER — NITROGLYCERIN 0.4 MG SL SUBL
0.8000 mg | SUBLINGUAL_TABLET | Freq: Once | SUBLINGUAL | Status: AC
  Administered 2024-07-05: 0.8 mg via SUBLINGUAL

## 2024-07-05 MED ORDER — IOHEXOL 350 MG/ML SOLN
100.0000 mL | Freq: Once | INTRAVENOUS | Status: AC | PRN
  Administered 2024-07-05: 100 mL via INTRAVENOUS

## 2024-07-06 ENCOUNTER — Other Ambulatory Visit: Payer: Self-pay | Admitting: Cardiology

## 2024-07-06 ENCOUNTER — Ambulatory Visit (HOSPITAL_BASED_OUTPATIENT_CLINIC_OR_DEPARTMENT_OTHER)
Admission: RE | Admit: 2024-07-06 | Discharge: 2024-07-06 | Disposition: A | Discharge status: Home / Self Care | Source: Ambulatory Visit | Attending: Cardiology | Admitting: Cardiology

## 2024-07-06 DIAGNOSIS — R0609 Other forms of dyspnea: Secondary | ICD-10-CM | POA: Diagnosis not present

## 2024-07-06 DIAGNOSIS — I2510 Atherosclerotic heart disease of native coronary artery without angina pectoris: Secondary | ICD-10-CM | POA: Diagnosis not present

## 2024-07-06 DIAGNOSIS — R931 Abnormal findings on diagnostic imaging of heart and coronary circulation: Secondary | ICD-10-CM

## 2024-07-07 ENCOUNTER — Ambulatory Visit: Payer: Self-pay | Admitting: Emergency Medicine

## 2024-07-07 DIAGNOSIS — R0609 Other forms of dyspnea: Secondary | ICD-10-CM

## 2024-07-07 DIAGNOSIS — I2510 Atherosclerotic heart disease of native coronary artery without angina pectoris: Secondary | ICD-10-CM

## 2024-07-11 ENCOUNTER — Telehealth: Payer: Self-pay | Admitting: *Deleted

## 2024-07-11 NOTE — Telephone Encounter (Signed)
 Spoke to patient's daughter, Glenys Prescott on 07-04-24 (see telephone note) and let her know that per the provider [facility], given that blood pressure remains above goal I recommend starting amlodipine  5 mg daily. Continue to monitor blood pressure at home and alert us  in 2 weeks via MyChart. Daughter understood everything and had no questions at this time. A new prescription for the Amlodipine  5 MG daily has been sent to patient's pharmacy.

## 2024-07-13 DIAGNOSIS — H5213 Myopia, bilateral: Secondary | ICD-10-CM | POA: Diagnosis not present

## 2024-07-19 NOTE — Telephone Encounter (Signed)
 Informed Consent   Shared Decision Making/Informed Consent The risks [chest pain, shortness of breath, cardiac arrhythmias, dizziness, blood pressure fluctuations, myocardial infarction, stroke/transient ischemic attack, nausea, vomiting, allergic reaction, radiation exposure, metallic taste sensation and life-threatening complications (estimated to be 1 in 10,000)], benefits (risk stratification, diagnosing coronary artery disease, treatment guidance) and alternatives of a cardiac PET stress test were discussed in detail with Tara Collier and she agrees to proceed.

## 2024-08-01 ENCOUNTER — Encounter (HOSPITAL_COMMUNITY): Payer: Self-pay

## 2024-08-02 ENCOUNTER — Ambulatory Visit (HOSPITAL_COMMUNITY)
Admission: RE | Admit: 2024-08-02 | Discharge: 2024-08-02 | Disposition: A | Source: Ambulatory Visit | Attending: Emergency Medicine

## 2024-08-02 ENCOUNTER — Telehealth (HOSPITAL_COMMUNITY): Payer: Self-pay | Admitting: *Deleted

## 2024-08-02 DIAGNOSIS — I10 Essential (primary) hypertension: Secondary | ICD-10-CM

## 2024-08-02 DIAGNOSIS — R0609 Other forms of dyspnea: Secondary | ICD-10-CM

## 2024-08-02 LAB — ECHOCARDIOGRAM COMPLETE
Area-P 1/2: 3.84 cm2
S' Lateral: 2.6 cm

## 2024-08-02 NOTE — Telephone Encounter (Signed)
Attempted to call patient regarding upcoming cardiac PET appointment. Left message on voicemail with name and callback number  Larey Brick RN Navigator Cardiac Imaging [facility] [phone] Office [phone] Cell  Reminder to avoid caffeine 12 hours prior to her cardiac PET study.

## 2024-08-03 ENCOUNTER — Inpatient Hospital Stay (HOSPITAL_COMMUNITY): Admission: RE | Admit: 2024-08-03

## 2024-08-03 DIAGNOSIS — I2510 Atherosclerotic heart disease of native coronary artery without angina pectoris: Secondary | ICD-10-CM | POA: Insufficient documentation

## 2024-08-03 DIAGNOSIS — R0609 Other forms of dyspnea: Secondary | ICD-10-CM | POA: Diagnosis not present

## 2024-08-03 LAB — NM PET CT CARDIAC PERFUSION MULTI W/ABSOLUTE BLOODFLOW
MBFR: 2.58
Nuc Rest EF: 53 %
Nuc Stress EF: 60 %
Rest MBF: 0.53 ml/g/min
Rest Nuclear Isotope Dose: 29.5 mCi
ST Depression (mm): 0 mm
Stress MBF: 1.37 ml/g/min
Stress Nuclear Isotope Dose: 29.7 mCi
TID: 1.03

## 2024-08-03 MED ORDER — RUBIDIUM RB82 GENERATOR (RUBYFILL)
29.7000 | PACK | Freq: Once | INTRAVENOUS | Status: AC
  Administered 2024-08-03: 29.7 via INTRAVENOUS

## 2024-08-03 MED ORDER — REGADENOSON 0.4 MG/5ML IV SOLN
0.4000 mg | Freq: Once | INTRAVENOUS | Status: AC
  Administered 2024-08-03: 0.4 mg via INTRAVENOUS

## 2024-08-03 MED ORDER — REGADENOSON 0.4 MG/5ML IV SOLN
INTRAVENOUS | Status: AC
  Filled 2024-08-03: qty 5

## 2024-08-03 MED ORDER — RUBIDIUM RB82 GENERATOR (RUBYFILL)
29.5000 | PACK | Freq: Once | INTRAVENOUS | Status: AC
  Administered 2024-08-03: 29.5 via INTRAVENOUS

## 2024-08-03 NOTE — Progress Notes (Signed)
 Pt. Tolerated lexi scan well.

## 2024-08-05 ENCOUNTER — Ambulatory Visit: Payer: Self-pay | Admitting: Emergency Medicine

## 2024-08-10 ENCOUNTER — Encounter: Payer: Self-pay | Admitting: Emergency Medicine

## 2024-08-10 ENCOUNTER — Ambulatory Visit: Attending: Emergency Medicine | Admitting: Emergency Medicine

## 2024-08-10 ENCOUNTER — Telehealth: Payer: Self-pay | Admitting: *Deleted

## 2024-08-10 VITALS — BP 130/70 | HR 50 | Ht 63.0 in | Wt 248.0 lb

## 2024-08-10 DIAGNOSIS — I10 Essential (primary) hypertension: Secondary | ICD-10-CM

## 2024-08-10 DIAGNOSIS — I4819 Other persistent atrial fibrillation: Secondary | ICD-10-CM

## 2024-08-10 DIAGNOSIS — R0683 Snoring: Secondary | ICD-10-CM | POA: Diagnosis not present

## 2024-08-10 DIAGNOSIS — G4733 Obstructive sleep apnea (adult) (pediatric): Secondary | ICD-10-CM

## 2024-08-10 DIAGNOSIS — I2510 Atherosclerotic heart disease of native coronary artery without angina pectoris: Secondary | ICD-10-CM

## 2024-08-10 DIAGNOSIS — Z01810 Encounter for preprocedural cardiovascular examination: Secondary | ICD-10-CM

## 2024-08-10 DIAGNOSIS — E785 Hyperlipidemia, unspecified: Secondary | ICD-10-CM

## 2024-08-10 DIAGNOSIS — R0609 Other forms of dyspnea: Secondary | ICD-10-CM | POA: Diagnosis not present

## 2024-08-10 NOTE — Progress Notes (Addendum)
 Cardiology Office Note:    Date:  08/10/2024  ID:  Tara Collier, DOB 1945-12-09, MRN [id number] PCP: Job Lukes, PA  [facility] Providers Cardiologist:  Soyla DELENA Merck, MD Cardiology APP:  Rana Lum CROME, NP       Patient Profile:       Chief Complaint: 6-week follow-up History of Present Illness:  Tara Collier is a 78 y.o. female with visit-pertinent history of dyspnea on exertion, persistent atrial fibrillation s/p ablation 2013, hypertension, hyperlipidemia, obstructive sleep apnea  Patient established care in 08/2020 for atrial fibrillation.  At that time she reported dyspnea on exertion with wheezing.  A-fib history included ablation in 2013.  She underwent echocardiogram which showed normal LV/RV function, mildly elevated PA pressure, RAE, mild aortic valve sclerosis without stenosis.  Patient wore ZIO monitor for 7 hours which showed A-fib for at least 74% of the time.  Upon review sinus rhythm was not clear on monitor strips.  Patient was referred to atrial fibrillation clinic.  She underwent nuclear stress test which was normal low risk study in anticipation of starting propafenone.  She was seen by Dr. Cindie on 05/2022 discussed options for atrial fibrillation management.  Plan was to see if patient could remain in SR for any period of time to guide catheter ablation would be an option.  Discussed Tikosyn loading and DCCV and patient was interested in proceeding.  She underwent sleep study with Dr. Burnard and was found to have severe sleep apnea but did not follow up with CPAP titration.   She was last seen in clinic on 04/07/2023.  At that time she had no cardiac awareness of her arrhythmia.  EKG showed slow ventricular response with heart rate 58 bpm.  No medication changes were made.  She was to follow-up in 6 months.   She was last seen in clinic on 06/15/2024.  She was experiencing worsening shortness of breath over the past several months and weight gain.   She was in rate controlled atrial fibrillation with heart rate of 61 bpm and unaware of her arrhythmia.  She had been fairly noncompliant with her furosemide  due to urinary frequency.  Her blood pressure was elevated at 164/78 and her losartan  was discontinued for olmesartan  40 mg daily.  She was continued on carvedilol  12.5 mg twice daily.  She underwent coronary CTA on 07/05/2024 showing coronary calcium  score 102 (56th percentile) with moderate atherosclerosis 50-69% proximal to mid LAD.  FFR analysis demonstrated no hemodynamically flow-limiting lesions in the RCA or LCx.  The distal LAD was not modeled (CTA showed borderline lesion in the mid LAD).  Cardiac PET stress was recommended.  Echocardiogram 08/02/2024 showed LVEF 60 to 65%, no RWMA, elevated left atrial pressure, RV function and size normal, mildly elevated PASP, biatrial size severely dilated, trivial MR, mild AI.  Cardiac PET stress test was ordered and completed on 08/03/2024 that was a normal low risk study with no evidence of ischemia or infarction.  On 06/29/2024 patient reported ongoing elevated blood pressures.  She was started on amlodipine  5 mg daily.   Discussed the use of AI scribe software for clinical note transcription with the patient, who gave verbal consent to proceed.  History of Present Illness Tara Collier is a 78 year old female with coronary artery disease who presents for follow-up.  She is accompanied by her daughter.  Today patient is doing well.  She reports her dyspnea on exertion has improved since her last office visit.  Tells me  her shortness of breath is about back at her chronic baseline.  She denies any lower extremity edema, weight gain, orthopnea, or PND.  Furthermore she denies any chest pains or any concerning anginal symptoms.  Tells me she stays cardiac unaware of her arrhythmia and denies any palpitations or episodes of tachycardia.  She is without any episodes of syncope, near syncope, lightheadedness  or dizziness.  Review of systems:  Please see the history of present illness. All other systems are reviewed and otherwise negative.      Studies Reviewed:    EKG Interpretation Date/Time:  Wednesday August 10 2024 15:18:07 EST Ventricular Rate:  50 PR Interval:    QRS Duration:  84 QT Interval:  458 QTC Calculation: 417 R Axis:   4  Text Interpretation: Atrial fibrillation with slow ventricular response Low voltage QRS Cannot rule out Anterior infarct , age undetermined When compared with ECG of 15-Jun-2024 15:26, QT has shortened Confirmed by Rana Dixon [phone]) on 08/10/2024 5:10:02 PM    Cardiac PET stress 08/03/2024   The study is normal. The study is low risk.   LV perfusion is normal. There is no evidence of ischemia. There is no evidence of infarction.   Rest left ventricular function is normal. Rest EF: 53%. Stress left ventricular function is normal. Stress EF: 60%. End diastolic cavity size is normal. End systolic cavity size is normal.   Myocardial blood flow was computed to be 0.57ml/g/min at rest and 1.25ml/g/min at stress. Global myocardial blood flow reserve was 2.58 and was normal.   Coronary calcium  was present on the attenuation correction CT images. Moderate coronary calcifications were present. Coronary calcifications were present in the left anterior descending artery and right coronary artery distribution(s).   Electronically signed by: Stanly Leavens MD  Echocardiogram 08/02/2024 1. Left ventricular ejection fraction, by estimation, is 60 to 65%. The  left ventricle has normal function. The left ventricle has no regional  wall motion abnormalities. Left ventricular diastolic function could not  be evaluated. Elevated left atrial  pressure.   2. Right ventricular systolic function is normal. The right ventricular  size is normal. There is mildly elevated pulmonary artery systolic  pressure. The estimated right ventricular systolic pressure is  42.7 mmHg.   3. Left atrial size was severely dilated.   4. Right atrial size was severely dilated.   5. The mitral valve is normal in structure. Trivial mitral valve  regurgitation. No evidence of mitral stenosis.   6. The aortic valve is tricuspid. Aortic valve regurgitation is mild.  Aortic valve sclerosis is present, with no evidence of aortic valve  stenosis.   7. The inferior vena cava is normal in size with greater than 50%  respiratory variability, suggesting right atrial pressure of 3 mmHg.   Coronary CTA 07/05/2024 1. Coronary calcium  score of 102. This was 56th percentile for age-, sex, and race-matched controls.   2. Normal coronary origin with right dominance.   3. Moderate atherosclerosis: 50-69% prox to mid LAD.   4. This study has been submitted for FFR analysis.  FFR 1. Coronary CTA FFR flow analysis demonstrates no hemodynamically flow limiting lesions in the RCA or LCx. The distal LAD is not modeled (CTA showed borderline lesion in the mid LAD). There is also borderline reduction in flow in the distal OM likely related to gradual tapering of the vessel or small vessel disease (no corresponding plaque on CTA).   2. Consider Stress PET CT to rule out ischemia in the  mid to distal LAD territory.  Echocardiogram 02/20/2022  1. Left ventricular ejection fraction, by estimation, is 60 to 65%. The  left ventricle has normal function. The left ventricle has no regional  wall motion abnormalities. Left ventricular diastolic function could not  be evaluated.   2. Right ventricular systolic function is normal. The right ventricular  size is normal.   3. Left atrial size was mildly dilated.   4. Right atrial size was mildly dilated.   5. The mitral valve is normal in structure. Trivial mitral valve  regurgitation. No evidence of mitral stenosis.   6. The aortic valve is tricuspid. Aortic valve regurgitation is not  visualized. Aortic valve sclerosis/calcification is  present, without any  evidence of aortic stenosis. Aortic valve Vmax measures 1.15 m/s.   7. The inferior vena cava is normal in size with greater than 50%  respiratory variability, suggesting right atrial pressure of 3 mmHg.    Lexiscan  Myoview  10/30/2020 Nuclear stress EF: 63%. There was no ST segment deviation noted during stress. The study is normal. This is a low risk study. The left ventricular ejection fraction is normal (55-65%).   Normal stress nuclear study with no ischemia or infarction.  Gated ejection fraction 63% with normal wall motion.   ZIO 10/23/2021 Patient wore monitor for 7 hours, for which she was in Afib for at least 94% of time. I do not clearly see sinus rhythm on monitor strips.  Rate controlled atrial fibrillation.    Echocardiogram 10/05/2020  1. Left ventricular ejection fraction, by estimation, is 60 to 65%. The  left ventricle has normal function. The left ventricle has no regional  wall motion abnormalities. Left ventricular diastolic function could not  be evaluated.   2. Right ventricular systolic function is normal. The right ventricular  size is mildly enlarged. There is mildly elevated pulmonary artery  systolic pressure. The estimated right ventricular systolic pressure is  44.0 mmHg.   3. Right atrial size was moderately dilated.   4. The mitral valve is normal in structure. No evidence of mitral valve  regurgitation. No evidence of mitral stenosis.   5. The aortic valve is tricuspid. Aortic valve regurgitation is not  visualized. Mild aortic valve sclerosis is present, with no evidence of  aortic valve stenosis.   6. The inferior vena cava is normal in size with greater than 50%  respiratory variability, suggesting right atrial pressure of 3 mmHg.   Risk Assessment/Calculations:    CHA2DS2-VASc Score = 5   This indicates a 7.2% annual risk of stroke. The patient's score is based upon: CHF History: 1 HTN History: 1 Diabetes History:  0 Stroke History: 0 Vascular Disease History: 0 Age Score: 2 Gender Score: 1        STOP-Bang Score:  6       Physical Exam:   VS:  BP 130/70 (BP Location: Right Arm, Patient Position: Sitting, Cuff Size: Large)   Pulse (!) 50   Ht 5' 3 (1.6 m)   Wt 248 lb (112.5 kg)   BMI 43.93 kg/m    Wt Readings from Last 3 Encounters:  08/10/24 248 lb (112.5 kg)  06/15/24 251 lb (113.9 kg)  05/24/24 252 lb (114.3 kg)    GEN: Well nourished, well developed in no acute distress NECK: No JVD; No carotid bruits CARDIAC: Irregularly irregular rhythm.  No murmurs, rubs, gallops RESPIRATORY:  Clear to auscultation without rales, wheezing or rhonchi  ABDOMEN: Soft, non-tender, non-distended EXTREMITIES:  No edema; No  acute deformity      Assessment and Plan:  Longstanding persistent atrial fibrillation S/p flutter ablation in 2013 ZIO 09/2020 (worn for 7 hours) showed atrial fibrillation at 94% Unclear duration of her atrial fibrillation but seems it has been persistent at least the last 3 years - EKG today shows rate controlled atrial fibrillation with HR 50 bpm - She is currently cardiac unaware of her arrhythmia and denies any palpitations or episodes of tachycardia - Previously discussed with EP for consideration of Tikosyn admission or possibility of LAAO but she was lost to follow-up - I have discussed with her reestablishing with EP to discuss further options as her dyspnea could be contributory.  At this time she politely defers.  Will continue with rate control at this time - Heart rate is noted to be 50 bpm.  She is without any episodes of syncope, presyncope, lightheadedness or dizziness.  Will closely monitor heart rate and possibly need to decrease beta-blocker in the future for better heart rate response - Continue Eliquis  5 mg twice daily, no bleeding concerns - Continue carvedilol  12.5 mg twice daily   Coronary artery disease Coronary CTA 06/2024 with CAC of 102 (56 percentile)  with moderate atherosclerosis 50-69% proximal to mid LAD Cardiac PET stress 07/2024 was low risk without evidence of ischemia or infarction - Today patient is stable without chest pains.  Reports her dyspnea has improved.  No symptoms to suggest active angina.  No indication further ischemic evaluation at this time - No ASA due to DOAC - Continue atorvastatin  40 mg daily  Dyspnea on exertion Pulmonary hypertension This is longstanding.  Likely multifactorial and may be related to persistent atrial fibrillation, uncontrolled OSA, deconditioning, or OHS Echo 07/2024 with evaded PASP of 42.7 mmHg with normal size and function of the RV - Stable.  Most recent echocardiogram, coronary CTA, and cardiac PET stress were reassuring.  No further ischemic interventions warranted at this time - Encouraged weight loss.  Will repeat sleep study for her severe OSA - Continue furosemide  20 mg daily   Hypertension Blood pressure today is 130/70 well-controlled - Her severe untreated OSA is likely contributory for difficult to control HTN in the past - Continue carvedilol  12.5 mg twice daily, olmesartan  40 mg daily, and amlodipine  5 mg   Obstructive sleep apnea History of severe obstructive sleep apnea with AHI 60.6/H on 05/2022 - CPAP titration was recommended however patient never started on CPAP therapy - Repeat sleep study ordered today.  She is willing to trial CPAP if needed  Hyperlipidemia, LDL goal <70 LDL 105 on 04/2024 - Encouraged weight loss and physical exercise - She will have repeat labs with her PCP - Continue atorvastatin  40 mg daily.  Can increase to max dosage if needed to reach goal   Obesity BMI 43.93 - She plans to start Zepbound  prescribed by PCP   Preoperative cardiovascular exam Hysteroscopy D&C   According to the Revised Cardiac Risk Index (RCRI), her Perioperative Risk of Major Cardiac Event is (%): 0.4. Her Functional Capacity in METs is: 4.4 according to the [facility]  Activity Status Index (DASI).   Therefore, based on ACC/AHA guidelines, patient would be at acceptable risk for the planned procedure without further cardiovascular testing. I will route this recommendation to the requesting party via Epic fax function.  Per office protocol, patient can hold Eliquis  for 2 days prior to procedure.        Dispo:  Return in about 3 months (around 11/08/2024).  Signed,  Lum LITTIE Louis, NP

## 2024-08-10 NOTE — Telephone Encounter (Signed)
-----   Message from [facility] Crystal W sent at 08/10/2024  3:58 PM EST ----- Regarding: Tara Collier afternoon I have ordered an Itamar for this patient.

## 2024-08-10 NOTE — Patient Instructions (Addendum)
 Medication Instructions:  NO CHANGES  Lab Work: NONE TO BE DONE TODAY.  Testing/Procedures: WatchPAT? is an FDA-cleared portable home sleep study test that uses a watch and 3 points of contact to monitor 7 different channels, including your heart rate, oxygen saturation, body position, snoring, and chest motion.  The study is easy to use from the comfort of your own home and accurately detect sleep apnea.  Before bed, you attach the chest sensor, attached the sleep apnea bracelet to your nondominant hand, and attach the finger probe.  After the study, the raw data is downloaded from the watch and scored for apnea events.   For more information: [email or web address]  Patient Testing Instructions:  Once our office has received insurance approval for you to complete this test, we will contact you with a PIN to activate the device.  This typically takes 2-3 weeks.  Please do not open the box until approved.   Do not put battery into the device until bedtime when you are ready to begin the test. Please call the support number if you need assistance after following the instructions below: 24 hour support line- [phone] or ITAMAR support at [phone] (option 2)  Download the Itamar WatchPAT One app through the [employer] or [employer]. Be sure to turn on or enable access to Bluetooth in settings on your smartphone. Make sure no other Bluetooth devices are on and within the vicinity of your smartphone and WatchPAT watch during testing.  Make sure to leave your smart phone plugged in and charging all night.  When ready for bed:  Follow the instructions step by step in the WatchPAT One app to activate the testing device. For additional instructions, including video instruction, visit the WatchPAT One video on Youtube. You can search for WatchPAT One within Youtube (video is 4 minutes and 18 seconds) or enter: https://youtube/[email or web address] Please note: You  will be prompted to enter a PIN to connect via Bluetooth when starting the test. The PIN will be assigned to you after insurance has approved the test.  The device is disposable, but it recommended that you retain the device until you receive a call letting you know the study has been received and the results have been interpreted.  We will let you know if the study did not transmit to us  properly after the test is completed. You do not need to call us  to confirm the receipt of the test.  Please complete the test within 48 hours of receiving PIN.   Frequently Asked Questions:  What is Watch PAT One?  A single use, fully disposable home sleep apnea testing device and will not need to be returned after completion.  What are the requirements to use WatchPAT One?  A successful WatchPAT One sleep study requires a WatchPAT One device, your smart phone, WatchPAT One app, your PIN number, and internet access. What type of phone do I need?  You should have a smart phone that uses Android 5.1 and above or any iPhone with IOS 10 and above. How can I download the WatchPAT one app?  Based on your device type search for WatchPAT One app either in [employer] for [employer] or [employer] for [employer]. Where will I get my PIN for the study?  Your PIN will be provided by your physician's office after insurance has approved the test. This process typically takes 2-3 weeks. It is used for authentication and if you lose/forget your PIN, please reach out  to your provider's office.  I do not have internet at home. Can I still complete a WatchPAT One study?  WatchPAT One needs internet connection throughout the night to be able to transmit the sleep data. You can use your home/local internet or your cellular data package. However, it is always recommended to use home/local internet. It is estimated that between 20MB-30MB of data will be used with each study, but the application will be looking for  space in the phone to start the study.  What happens if I lose internet or Bluetooth connection?  During the internet disconnection, your phone will not be able to transmit the sleep data.  All the data, will be stored in your phone.  As soon as the internet connection is back on, the phone will resume sending the sleep data. During the Bluetooth disconnection, WatchPAT One will not be able to to send the sleep data to your phone.  Data will be kept in the WatchPAT One until both devices have Bluetooth connection back on.  As soon as the connection is back on, WatchPAT one will send the sleep data to the phone.  How long do I need to wear the WatchPAT one?  After you start the study, you should wear the device at least 6 hours.  How far should I keep my phone from the device?  During the night, your phone should remain within 15 feet of where you sleep.  What happens if I leave the room for restroom or other reasons?  Leaving the room for any reason will not cause any problem. As soon as your get back to the room, both devices will reconnect and will continue to send the sleep data. Can I use my phone during the sleep study?  Yes, you can use your phone as usual during the study. But it is recommended to put your WatchPAT One on when you are ready to go to bed.  How will I get my study results?  A soon as you completed your study, your sleep data will be sent to the provider. They will then share the results with you when they are ready.    Follow-Up: At [facility], you and your health needs are our priority.  As part of our continuing mission to provide you with exceptional heart care, our providers are all part of one team.  This team includes your primary Cardiologist (physician) and Advanced Practice Providers or APPs (Physician Assistants and Nurse Practitioners) who all work together to provide you with the care you need, when you need it.  Your next appointment:   3  MONTHS  Provider:   Gayatri A Acharya, MD

## 2024-08-14 ENCOUNTER — Telehealth: Payer: Self-pay | Admitting: Internal Medicine

## 2024-08-18 ENCOUNTER — Other Ambulatory Visit: Payer: Self-pay | Admitting: Physician Assistant

## 2024-08-23 ENCOUNTER — Other Ambulatory Visit: Payer: Self-pay | Admitting: Obstetrics and Gynecology

## 2024-08-23 MED ORDER — CARVEDILOL 12.5 MG PO TABS: 12.5000 mg | ORAL_TABLET | Freq: Two times a day (BID) | ORAL | 3 refills | Status: AC

## 2024-08-23 NOTE — Telephone Encounter (Signed)
 Med refill request: phenazopyridine  (PYRIDIUM ) 200 MG tablet  Start:  05/24/24  Disp:  6 tablets Refills:  0  Last OV:  05/24/24   Next AEX:  Not yet scheduled Last MMG (if hormonal med):  N/A Refill authorized? Please Advise.

## 2024-10-25 ENCOUNTER — Ambulatory Visit: Payer: Medicare HMO

## 2024-11-08 ENCOUNTER — Ambulatory Visit: Admitting: Internal Medicine
